# Patient Record
Sex: Female | Born: 1935 | Race: Black or African American | Hispanic: No | Smoking: Former smoker
Health system: Southern US, Community
[De-identification: ages and names within clinical notes are randomized; demographics above are authoritative.]

## PROBLEM LIST (undated history)

## (undated) DIAGNOSIS — M5136 Other intervertebral disc degeneration, lumbar region: Secondary | ICD-10-CM

## (undated) DIAGNOSIS — K449 Diaphragmatic hernia without obstruction or gangrene: Secondary | ICD-10-CM

## (undated) DIAGNOSIS — K635 Polyp of colon: Secondary | ICD-10-CM

## (undated) DIAGNOSIS — M51369 Other intervertebral disc degeneration, lumbar region without mention of lumbar back pain or lower extremity pain: Secondary | ICD-10-CM

## (undated) DIAGNOSIS — E785 Hyperlipidemia, unspecified: Secondary | ICD-10-CM

## (undated) DIAGNOSIS — K5792 Diverticulitis of intestine, part unspecified, without perforation or abscess without bleeding: Secondary | ICD-10-CM

## (undated) DIAGNOSIS — M1990 Unspecified osteoarthritis, unspecified site: Secondary | ICD-10-CM

## (undated) DIAGNOSIS — K56609 Unspecified intestinal obstruction, unspecified as to partial versus complete obstruction: Secondary | ICD-10-CM

## (undated) DIAGNOSIS — M8580 Other specified disorders of bone density and structure, unspecified site: Secondary | ICD-10-CM

## (undated) DIAGNOSIS — K219 Gastro-esophageal reflux disease without esophagitis: Secondary | ICD-10-CM

## (undated) DIAGNOSIS — I10 Essential (primary) hypertension: Secondary | ICD-10-CM

## (undated) DIAGNOSIS — C20 Malignant neoplasm of rectum: Secondary | ICD-10-CM

## (undated) HISTORY — DX: Hyperlipidemia, unspecified: E78.5

## (undated) HISTORY — DX: Other specified disorders of bone density and structure, unspecified site: M85.80

## (undated) HISTORY — PX: KNEE ARTHROSCOPY: SHX127

## (undated) HISTORY — PX: COSMETIC SURGERY: SHX468

## (undated) HISTORY — DX: Diaphragmatic hernia without obstruction or gangrene: K44.9

## (undated) HISTORY — DX: Gastro-esophageal reflux disease without esophagitis: K21.9

## (undated) HISTORY — PX: OTHER SURGICAL HISTORY: SHX169

## (undated) HISTORY — PX: COLON SURGERY: SHX602

## (undated) HISTORY — PX: BACK SURGERY: SHX140

## (undated) HISTORY — DX: Other intervertebral disc degeneration, lumbar region without mention of lumbar back pain or lower extremity pain: M51.369

## (undated) HISTORY — PX: APPENDECTOMY: SHX54

## (undated) HISTORY — PX: COLOSTOMY: SHX63

## (undated) HISTORY — PX: BREAST CYST EXCISION: SHX579

## (undated) HISTORY — PX: TUMOR REMOVAL: SHX12

## (undated) HISTORY — DX: Polyp of colon: K63.5

## (undated) HISTORY — DX: Other intervertebral disc degeneration, lumbar region: M51.36

## (undated) HISTORY — DX: Essential (primary) hypertension: I10

## (undated) HISTORY — PX: COLONOSCOPY W/ BIOPSIES AND POLYPECTOMY: SHX1376

## (undated) HISTORY — PX: COLOSTOMY TAKEDOWN: SHX5783

---

## 1999-01-09 ENCOUNTER — Other Ambulatory Visit: Admission: RE | Admit: 1999-01-09 | Discharge: 1999-01-09 | Payer: Self-pay | Admitting: Family Medicine

## 2000-10-20 ENCOUNTER — Ambulatory Visit (HOSPITAL_COMMUNITY): Admission: RE | Admit: 2000-10-20 | Discharge: 2000-10-20 | Payer: Self-pay | Admitting: Internal Medicine

## 2001-03-09 ENCOUNTER — Other Ambulatory Visit: Admission: RE | Admit: 2001-03-09 | Discharge: 2001-03-09 | Payer: Self-pay | Admitting: Family Medicine

## 2004-10-15 ENCOUNTER — Ambulatory Visit: Payer: Self-pay | Admitting: Internal Medicine

## 2004-10-16 ENCOUNTER — Ambulatory Visit (HOSPITAL_COMMUNITY): Admission: RE | Admit: 2004-10-16 | Discharge: 2004-10-16 | Payer: Self-pay | Admitting: Internal Medicine

## 2004-10-18 ENCOUNTER — Ambulatory Visit: Payer: Self-pay | Admitting: Internal Medicine

## 2004-10-25 ENCOUNTER — Ambulatory Visit (HOSPITAL_COMMUNITY): Admission: RE | Admit: 2004-10-25 | Discharge: 2004-10-25 | Payer: Self-pay | Admitting: Internal Medicine

## 2004-11-13 ENCOUNTER — Ambulatory Visit: Payer: Self-pay | Admitting: Internal Medicine

## 2004-11-18 ENCOUNTER — Ambulatory Visit (HOSPITAL_COMMUNITY): Admission: RE | Admit: 2004-11-18 | Discharge: 2004-11-18 | Payer: Self-pay | Admitting: Internal Medicine

## 2004-12-13 ENCOUNTER — Emergency Department (HOSPITAL_COMMUNITY): Admission: EM | Admit: 2004-12-13 | Discharge: 2004-12-13 | Payer: Self-pay | Admitting: Emergency Medicine

## 2005-01-09 ENCOUNTER — Other Ambulatory Visit: Admission: RE | Admit: 2005-01-09 | Discharge: 2005-01-09 | Payer: Self-pay | Admitting: Family Medicine

## 2010-05-13 ENCOUNTER — Ambulatory Visit: Payer: Medicare Other | Attending: Orthopedic Surgery | Admitting: Physical Therapy

## 2010-05-13 DIAGNOSIS — R5381 Other malaise: Secondary | ICD-10-CM | POA: Insufficient documentation

## 2010-05-13 DIAGNOSIS — M25569 Pain in unspecified knee: Secondary | ICD-10-CM | POA: Insufficient documentation

## 2010-05-13 DIAGNOSIS — M6281 Muscle weakness (generalized): Secondary | ICD-10-CM | POA: Insufficient documentation

## 2010-05-13 DIAGNOSIS — IMO0001 Reserved for inherently not codable concepts without codable children: Secondary | ICD-10-CM | POA: Insufficient documentation

## 2010-05-16 ENCOUNTER — Ambulatory Visit: Payer: Medicare Other | Admitting: Physical Therapy

## 2010-05-21 ENCOUNTER — Ambulatory Visit: Payer: Medicare Other | Admitting: Physical Therapy

## 2010-05-23 ENCOUNTER — Ambulatory Visit: Payer: Medicare Other | Admitting: Physical Therapy

## 2010-05-28 ENCOUNTER — Ambulatory Visit: Payer: Medicare Other | Admitting: Physical Therapy

## 2010-05-30 ENCOUNTER — Ambulatory Visit: Payer: Medicare Other | Attending: Orthopedic Surgery | Admitting: Physical Therapy

## 2010-05-30 DIAGNOSIS — M6281 Muscle weakness (generalized): Secondary | ICD-10-CM | POA: Insufficient documentation

## 2010-05-30 DIAGNOSIS — R5381 Other malaise: Secondary | ICD-10-CM | POA: Insufficient documentation

## 2010-05-30 DIAGNOSIS — IMO0001 Reserved for inherently not codable concepts without codable children: Secondary | ICD-10-CM | POA: Insufficient documentation

## 2010-05-30 DIAGNOSIS — M25569 Pain in unspecified knee: Secondary | ICD-10-CM | POA: Insufficient documentation

## 2010-06-04 ENCOUNTER — Ambulatory Visit: Payer: Medicare Other | Admitting: Physical Therapy

## 2010-06-06 ENCOUNTER — Ambulatory Visit: Payer: Medicare Other | Admitting: Physical Therapy

## 2010-06-11 ENCOUNTER — Ambulatory Visit: Payer: Medicare Other | Admitting: Physical Therapy

## 2010-06-13 ENCOUNTER — Ambulatory Visit: Payer: Medicare Other | Admitting: Physical Therapy

## 2010-06-18 ENCOUNTER — Ambulatory Visit: Payer: Medicare Other | Admitting: Physical Therapy

## 2010-06-20 ENCOUNTER — Ambulatory Visit: Payer: Medicare Other | Admitting: Physical Therapy

## 2010-06-26 ENCOUNTER — Ambulatory Visit: Payer: Medicare Other | Admitting: Physical Therapy

## 2010-09-09 ENCOUNTER — Ambulatory Visit: Payer: PRIVATE HEALTH INSURANCE | Admitting: Physical Therapy

## 2010-09-10 ENCOUNTER — Ambulatory Visit: Payer: Medicare Other | Attending: Family Medicine | Admitting: Physical Therapy

## 2010-09-10 DIAGNOSIS — M545 Low back pain, unspecified: Secondary | ICD-10-CM | POA: Insufficient documentation

## 2010-09-10 DIAGNOSIS — R293 Abnormal posture: Secondary | ICD-10-CM | POA: Insufficient documentation

## 2010-09-10 DIAGNOSIS — R5381 Other malaise: Secondary | ICD-10-CM | POA: Insufficient documentation

## 2010-09-10 DIAGNOSIS — IMO0001 Reserved for inherently not codable concepts without codable children: Secondary | ICD-10-CM | POA: Insufficient documentation

## 2010-09-12 ENCOUNTER — Ambulatory Visit: Payer: Medicare Other | Admitting: Physical Therapy

## 2010-09-17 ENCOUNTER — Ambulatory Visit: Payer: Medicare Other | Admitting: Physical Therapy

## 2010-09-19 ENCOUNTER — Ambulatory Visit: Payer: Medicare Other | Admitting: Physical Therapy

## 2010-09-24 ENCOUNTER — Ambulatory Visit: Payer: Medicare Other | Admitting: Physical Therapy

## 2010-09-26 ENCOUNTER — Ambulatory Visit: Payer: Medicare Other | Admitting: *Deleted

## 2010-10-01 ENCOUNTER — Ambulatory Visit: Payer: Medicare Other | Attending: Family Medicine | Admitting: Physical Therapy

## 2010-10-01 DIAGNOSIS — R293 Abnormal posture: Secondary | ICD-10-CM | POA: Insufficient documentation

## 2010-10-01 DIAGNOSIS — R5381 Other malaise: Secondary | ICD-10-CM | POA: Insufficient documentation

## 2010-10-01 DIAGNOSIS — M545 Low back pain, unspecified: Secondary | ICD-10-CM | POA: Insufficient documentation

## 2010-10-01 DIAGNOSIS — IMO0001 Reserved for inherently not codable concepts without codable children: Secondary | ICD-10-CM | POA: Insufficient documentation

## 2010-10-03 ENCOUNTER — Ambulatory Visit: Payer: Medicare Other | Admitting: Physical Therapy

## 2010-10-09 ENCOUNTER — Ambulatory Visit: Payer: Medicare Other | Admitting: Physical Therapy

## 2011-06-09 DIAGNOSIS — M545 Low back pain: Secondary | ICD-10-CM | POA: Diagnosis not present

## 2011-07-23 DIAGNOSIS — M79609 Pain in unspecified limb: Secondary | ICD-10-CM | POA: Diagnosis not present

## 2012-01-29 DIAGNOSIS — E559 Vitamin D deficiency, unspecified: Secondary | ICD-10-CM | POA: Diagnosis not present

## 2012-01-29 DIAGNOSIS — E785 Hyperlipidemia, unspecified: Secondary | ICD-10-CM | POA: Diagnosis not present

## 2012-01-29 DIAGNOSIS — Z124 Encounter for screening for malignant neoplasm of cervix: Secondary | ICD-10-CM | POA: Diagnosis not present

## 2012-01-29 DIAGNOSIS — M25569 Pain in unspecified knee: Secondary | ICD-10-CM | POA: Diagnosis not present

## 2012-01-29 DIAGNOSIS — R002 Palpitations: Secondary | ICD-10-CM | POA: Diagnosis not present

## 2012-01-29 DIAGNOSIS — Z0000 Encounter for general adult medical examination without abnormal findings: Secondary | ICD-10-CM | POA: Diagnosis not present

## 2012-01-29 DIAGNOSIS — I10 Essential (primary) hypertension: Secondary | ICD-10-CM | POA: Diagnosis not present

## 2012-01-30 DIAGNOSIS — Z23 Encounter for immunization: Secondary | ICD-10-CM | POA: Diagnosis not present

## 2012-02-03 ENCOUNTER — Encounter: Payer: Self-pay | Admitting: Gastroenterology

## 2012-03-04 ENCOUNTER — Telehealth: Payer: Self-pay | Admitting: *Deleted

## 2012-03-04 NOTE — Telephone Encounter (Signed)
Sent no show letter. Cancelled procedure on 03/18/12

## 2012-03-18 ENCOUNTER — Encounter: Payer: PRIVATE HEALTH INSURANCE | Admitting: Gastroenterology

## 2012-03-20 DIAGNOSIS — I10 Essential (primary) hypertension: Secondary | ICD-10-CM | POA: Diagnosis not present

## 2012-03-20 DIAGNOSIS — H8309 Labyrinthitis, unspecified ear: Secondary | ICD-10-CM | POA: Diagnosis not present

## 2012-03-26 DIAGNOSIS — H8309 Labyrinthitis, unspecified ear: Secondary | ICD-10-CM | POA: Diagnosis not present

## 2012-05-04 DIAGNOSIS — Z86010 Personal history of colonic polyps: Secondary | ICD-10-CM | POA: Diagnosis not present

## 2012-05-28 DIAGNOSIS — Z79899 Other long term (current) drug therapy: Secondary | ICD-10-CM | POA: Diagnosis not present

## 2012-05-28 DIAGNOSIS — R42 Dizziness and giddiness: Secondary | ICD-10-CM | POA: Diagnosis not present

## 2012-05-28 DIAGNOSIS — K648 Other hemorrhoids: Secondary | ICD-10-CM | POA: Diagnosis not present

## 2012-05-28 DIAGNOSIS — Z833 Family history of diabetes mellitus: Secondary | ICD-10-CM | POA: Diagnosis not present

## 2012-05-28 DIAGNOSIS — D126 Benign neoplasm of colon, unspecified: Secondary | ICD-10-CM | POA: Diagnosis not present

## 2012-05-28 DIAGNOSIS — Z980 Intestinal bypass and anastomosis status: Secondary | ICD-10-CM | POA: Diagnosis not present

## 2012-05-28 DIAGNOSIS — Z860100 Personal history of colon polyps, unspecified: Secondary | ICD-10-CM | POA: Diagnosis not present

## 2012-05-28 DIAGNOSIS — Z1211 Encounter for screening for malignant neoplasm of colon: Secondary | ICD-10-CM | POA: Diagnosis not present

## 2012-05-28 DIAGNOSIS — Z823 Family history of stroke: Secondary | ICD-10-CM | POA: Diagnosis not present

## 2012-05-28 DIAGNOSIS — E785 Hyperlipidemia, unspecified: Secondary | ICD-10-CM | POA: Diagnosis not present

## 2012-05-28 DIAGNOSIS — D175 Benign lipomatous neoplasm of intra-abdominal organs: Secondary | ICD-10-CM | POA: Diagnosis not present

## 2012-05-28 DIAGNOSIS — I10 Essential (primary) hypertension: Secondary | ICD-10-CM | POA: Diagnosis not present

## 2012-05-28 DIAGNOSIS — K620 Anal polyp: Secondary | ICD-10-CM | POA: Diagnosis not present

## 2012-05-28 DIAGNOSIS — Z7982 Long term (current) use of aspirin: Secondary | ICD-10-CM | POA: Diagnosis not present

## 2012-05-28 DIAGNOSIS — K5730 Diverticulosis of large intestine without perforation or abscess without bleeding: Secondary | ICD-10-CM | POA: Diagnosis not present

## 2012-05-28 DIAGNOSIS — Z8719 Personal history of other diseases of the digestive system: Secondary | ICD-10-CM | POA: Diagnosis not present

## 2012-06-07 DIAGNOSIS — H8110 Benign paroxysmal vertigo, unspecified ear: Secondary | ICD-10-CM | POA: Diagnosis not present

## 2012-06-09 DIAGNOSIS — M899 Disorder of bone, unspecified: Secondary | ICD-10-CM | POA: Diagnosis not present

## 2012-06-09 DIAGNOSIS — M949 Disorder of cartilage, unspecified: Secondary | ICD-10-CM | POA: Diagnosis not present

## 2012-06-09 DIAGNOSIS — M810 Age-related osteoporosis without current pathological fracture: Secondary | ICD-10-CM | POA: Diagnosis not present

## 2012-06-24 ENCOUNTER — Ambulatory Visit (INDEPENDENT_AMBULATORY_CARE_PROVIDER_SITE_OTHER): Payer: Medicare Other | Admitting: General Practice

## 2012-06-24 ENCOUNTER — Ambulatory Visit (INDEPENDENT_AMBULATORY_CARE_PROVIDER_SITE_OTHER): Payer: Medicare Other

## 2012-06-24 VITALS — BP 160/82 | HR 76 | Temp 97.2°F | Ht 63.5 in | Wt 162.0 lb

## 2012-06-24 DIAGNOSIS — M25569 Pain in unspecified knee: Secondary | ICD-10-CM | POA: Diagnosis not present

## 2012-06-24 DIAGNOSIS — M1990 Unspecified osteoarthritis, unspecified site: Secondary | ICD-10-CM

## 2012-06-24 DIAGNOSIS — M25561 Pain in right knee: Secondary | ICD-10-CM

## 2012-06-24 DIAGNOSIS — M129 Arthropathy, unspecified: Secondary | ICD-10-CM

## 2012-06-24 MED ORDER — MELOXICAM 15 MG PO TABS: 15.0000 mg | ORAL_TABLET | Freq: Every day | ORAL | Status: DC

## 2012-06-24 NOTE — Patient Instructions (Addendum)
Knee Pain The knee is the complex joint between your thigh and your lower leg. It is made up of bones, tendons, ligaments, and cartilage. The bones that make up the knee are:  The femur in the thigh.  The tibia and fibula in the lower leg.  The patella or kneecap riding in the groove on the lower femur. CAUSES  Knee pain is a common complaint with many causes. A few of these causes are:  Injury, such as:  A ruptured ligament or tendon injury.  Torn cartilage.  Medical conditions, such as:  Gout  Arthritis  Infections  Overuse, over training or overdoing a physical activity. Knee pain can be minor or severe. Knee pain can accompany debilitating injury. Minor knee problems often respond well to self-care measures or get well on their own. More serious injuries may need medical intervention or even surgery. SYMPTOMS The knee is complex. Symptoms of knee problems can vary widely. Some of the problems are:  Pain with movement and weight bearing.  Swelling and tenderness.  Buckling of the knee.  Inability to straighten or extend your knee.  Your knee locks and you cannot straighten it.  Warmth and redness with pain and fever.  Deformity or dislocation of the kneecap. DIAGNOSIS  Determining what is wrong may be very straight forward such as when there is an injury. It can also be challenging because of the complexity of the knee. Tests to make a diagnosis may include:  Your caregiver taking a history and doing a physical exam.  Routine X-rays can be used to rule out other problems. X-rays will not reveal a cartilage tear. Some injuries of the knee can be diagnosed by:  Arthroscopy a surgical technique by which a small video camera is inserted through tiny incisions on the sides of the knee. This procedure is used to examine and repair internal knee joint problems. Tiny instruments can be used during arthroscopy to repair the torn knee cartilage (meniscus).  Arthrography  is a radiology technique. A contrast liquid is directly injected into the knee joint. Internal structures of the knee joint then become visible on X-ray film.  An MRI scan is a non x-ray radiology procedure in which magnetic fields and a computer produce two- or three-dimensional images of the inside of the knee. Cartilage tears are often visible using an MRI scanner. MRI scans have largely replaced arthrography in diagnosing cartilage tears of the knee.  Blood work.  Examination of the fluid that helps to lubricate the knee joint (synovial fluid). This is done by taking a sample out using a needle and a syringe. TREATMENT The treatment of knee problems depends on the cause. Some of these treatments are:  Depending on the injury, proper casting, splinting, surgery or physical therapy care will be needed.  Give yourself adequate recovery time. Do not overuse your joints. If you begin to get sore during workout routines, back off. Slow down or do fewer repetitions.  For repetitive activities such as cycling or running, maintain your strength and nutrition.  Alternate muscle groups. For example if you are a weight lifter, work the upper body on one day and the lower body the next.  Either tight or weak muscles do not give the proper support for your knee. Tight or weak muscles do not absorb the stress placed on the knee joint. Keep the muscles surrounding the knee strong.  Take care of mechanical problems.  If you have flat feet, orthotics or special shoes may help.   See your caregiver if you need help.  Arch supports, sometimes with wedges on the inner or outer aspect of the heel, can help. These can shift pressure away from the side of the knee most bothered by osteoarthritis.  A brace called an "unloader" brace also may be used to help ease the pressure on the most arthritic side of the knee.  If your caregiver has prescribed crutches, braces, wraps or ice, use as directed. The acronym for  this is Deskins. This means protection, rest, ice, compression and elevation.  Nonsteroidal anti-inflammatory drugs (NSAID's), can help relieve pain. But if taken immediately after an injury, they may actually increase swelling. Take NSAID's with food in your stomach. Stop them if you develop stomach problems. Do not take these if you have a history of ulcers, stomach pain or bleeding from the bowel. Do not take without your caregiver's approval if you have problems with fluid retention, heart failure, or kidney problems.  For ongoing knee problems, physical therapy may be helpful.  Glucosamine and chondroitin are over-the-counter dietary supplements. Both may help relieve the pain of osteoarthritis in the knee. These medicines are different from the usual anti-inflammatory drugs. Glucosamine may decrease the rate of cartilage destruction.  Injections of a corticosteroid drug into your knee joint may help reduce the symptoms of an arthritis flare-up. They may provide pain relief that lasts a few months. You may have to wait a few months between injections. The injections do have a small increased risk of infection, water retention and elevated blood sugar levels.  Hyaluronic acid injected into damaged joints may ease pain and provide lubrication. These injections may work by reducing inflammation. A series of shots may give relief for as long as 6 months.  Topical painkillers. Applying certain ointments to your skin may help relieve the pain and stiffness of osteoarthritis. Ask your pharmacist for suggestions. Many over the-counter products are approved for temporary relief of arthritis pain.  In some countries, doctors often prescribe topical NSAID's for relief of chronic conditions such as arthritis and tendinitis. A review of treatment with NSAID creams found that they worked as well as oral medications but without the serious side effects. PREVENTION  Maintain a healthy weight. Extra pounds put  more strain on your joints.  Get strong, stay limber. Weak muscles are a common cause of knee injuries. Stretching is important. Include flexibility exercises in your workouts.  Be smart about exercise. If you have osteoarthritis, chronic knee pain or recurring injuries, you may need to change the way you exercise. This does not mean you have to stop being active. If your knees ache after jogging or playing basketball, consider switching to swimming, water aerobics or other low-impact activities, at least for a few days a week. Sometimes limiting high-impact activities will provide relief.  Make sure your shoes fit well. Choose footwear that is right for your sport.  Protect your knees. Use the proper gear for knee-sensitive activities. Use kneepads when playing volleyball or laying carpet. Buckle your seat belt every time you drive. Most shattered kneecaps occur in car accidents.  Rest when you are tired. SEEK MEDICAL CARE IF:  You have knee pain that is continual and does not seem to be getting better.  SEEK IMMEDIATE MEDICAL CARE IF:  Your knee joint feels hot to the touch and you have a high fever. MAKE SURE YOU:   Understand these instructions.  Will watch your condition.  Will get help right away if you are not   doing well or get worse. Document Released: 01/12/2007 Document Revised: 06/09/2011 Document Reviewed: 01/12/2007 [facility] Patient Information 2013 [place], [place].   Arthritis, Nonspecific Arthritis is inflammation of a joint. This usually means pain, redness, warmth or swelling are present. One or more joints may be involved. There are a number of types of arthritis. Your caregiver may not be able to tell what type of arthritis you have right away. CAUSES  The most common cause of arthritis is the wear and tear on the joint (osteoarthritis). This causes damage to the cartilage, which can break down over time. The knees, hips, back and neck are most often affected by this  type of arthritis. Other types of arthritis and common causes of joint pain include:  Sprains and other injuries near the joint. Sometimes minor sprains and injuries cause pain and swelling that develop hours later.  Rheumatoid arthritis. This affects hands, feet and knees. It usually affects both sides of your body at the same time. It is often associated with chronic ailments, fever, weight loss and general weakness.  Crystal arthritis. Gout and pseudo gout can cause occasional acute severe pain, redness and swelling in the foot, ankle, or knee.  Infectious arthritis. Bacteria can get into a joint through a break in overlying skin. This can cause infection of the joint. Bacteria and viruses can also spread through the blood and affect your joints.  Drug, infectious and allergy reactions. Sometimes joints can become mildly painful and slightly swollen with these types of illnesses. SYMPTOMS   Pain is the main symptom.  Your joint or joints can also be red, swollen and warm or hot to the touch.  You may have a fever with certain types of arthritis, or even feel overall ill.  The joint with arthritis will hurt with movement. Stiffness is present with some types of arthritis. DIAGNOSIS  Your caregiver will suspect arthritis based on your description of your symptoms and on your exam. Testing may be needed to find the type of arthritis:  Blood and sometimes urine tests.  X-ray tests and sometimes CT or MRI scans.  Removal of fluid from the joint (arthrocentesis) is done to check for bacteria, crystals or other causes. Your caregiver (or a specialist) will numb the area over the joint with a local anesthetic, and use a needle to remove joint fluid for examination. This procedure is only minimally uncomfortable.  Even with these tests, your caregiver may not be able to tell what kind of arthritis you have. Consultation with a specialist (rheumatologist) may be helpful. TREATMENT  Your  caregiver will discuss with you treatment specific to your type of arthritis. If the specific type cannot be determined, then the following general recommendations may apply. Treatment of severe joint pain includes:  Rest.  Elevation.  Anti-inflammatory medication (for example, ibuprofen) may be prescribed. Avoiding activities that cause increased pain.  Only take over-the-counter or prescription medicines for pain and discomfort as recommended by your caregiver.  Cold packs over an inflamed joint may be used for 10 to 15 minutes every hour. Hot packs sometimes feel better, but do not use overnight. Do not use hot packs if you are diabetic without your caregiver's permission.  A cortisone shot into arthritic joints may help reduce pain and swelling.  Any acute arthritis that gets worse over the next 1 to 2 days needs to be looked at to be sure there is no joint infection. Long-term arthritis treatment involves modifying activities and lifestyle to reduce joint stress  jarring. This can include weight loss. Also, exercise is needed to nourish the joint cartilage and remove waste. This helps keep the muscles around the joint strong. HOME CARE INSTRUCTIONS   Do not take aspirin to relieve pain if gout is suspected. This elevates uric acid levels.  Only take over-the-counter or prescription medicines for pain, discomfort or fever as directed by your caregiver.  Rest the joint as much as possible.  If your joint is swollen, keep it elevated.  Use crutches if the painful joint is in your leg.  Drinking plenty of fluids may help for certain types of arthritis.  Follow your caregiver's dietary instructions.  Try low-impact exercise such as:  Swimming.  Water aerobics.  Biking.  Walking.  Morning stiffness is often relieved by a warm shower.  Put your joints through regular range-of-motion. SEEK MEDICAL CARE IF:   You do not feel better in 24 hours or are getting worse.  You  have side effects to medications, or are not getting better with treatment. SEEK IMMEDIATE MEDICAL CARE IF:   You have a fever.  You develop severe joint pain, swelling or redness.  Many joints are involved and become painful and swollen.  There is severe back pain and/or leg weakness.  You have loss of bowel or bladder control. Document Released: 04/24/2004 Document Revised: 06/09/2011 Document Reviewed: 05/10/2008 [facility] Patient Information 2013 [place], [place].

## 2012-06-24 NOTE — Progress Notes (Addendum)
  Subjective:    Patient ID: Anita Perez, female    DOB: February 06, 1936, 77 y.o.   MRN: [id number]  HPI Presents with right knee pain. Larey Seat 2 years ago and had surgery. Reports surgeon informed her that she had arthritis. Reports she has had pain since her fall 2 years ago and its gradually worsening, especially over past 3 days. Difficulty bearing weight upon first standing from a sitting position (depending on length of time sitting). OTC tylenol being used with minimal relief. Denies taken prescription arthritic medication. Denies recent trauma or injury.     Review of Systems  Constitutional: Negative for fever and chills.  Respiratory: Negative for chest tightness and shortness of breath.   Cardiovascular: Negative for chest pain and leg swelling.  Musculoskeletal: Positive for joint swelling.       Right knee       Objective:   Physical Exam  Constitutional: She is oriented to person, place, and time. She appears well-developed and well-nourished.  Cardiovascular: Normal rate, regular rhythm and normal heart sounds.   No murmur heard. Musculoskeletal: She exhibits edema.  Limited range of motion in right knee. Patient has some noticeable difficulty with walking initially upon standing.   Neurological: She is alert and oriented to person, place, and time.  Skin: Skin is warm and dry.  Psychiatric: She has a normal mood and affect.  crepitus to right knee felt with flex and extension of right knee  [facility] reading (PRIMARY) by Ruthell Rummage, FNP-C, no dislocation or fracture noted to right knee.                                       Assessment & Plan:  Take medication as prescribed Warm compresses to right knee Physical activity increased as tolerated RTO if symptoms worsen or no improvement Patient verbalized understanding   Raymon Mutton, FNP-C

## 2012-08-10 ENCOUNTER — Ambulatory Visit: Payer: Self-pay | Admitting: Family Medicine

## 2012-11-04 ENCOUNTER — Telehealth: Payer: Self-pay | Admitting: General Practice

## 2012-11-08 NOTE — Telephone Encounter (Signed)
LM for pt to make appt, needs labs before we can fill mail order

## 2012-11-22 ENCOUNTER — Encounter: Payer: Self-pay | Admitting: General Practice

## 2012-11-22 ENCOUNTER — Ambulatory Visit (INDEPENDENT_AMBULATORY_CARE_PROVIDER_SITE_OTHER): Payer: Medicare Other | Admitting: General Practice

## 2012-11-22 VITALS — BP 169/82 | HR 79 | Temp 97.8°F | Ht 63.5 in | Wt 166.0 lb

## 2012-11-22 DIAGNOSIS — Z09 Encounter for follow-up examination after completed treatment for conditions other than malignant neoplasm: Secondary | ICD-10-CM | POA: Diagnosis not present

## 2012-11-22 DIAGNOSIS — E785 Hyperlipidemia, unspecified: Secondary | ICD-10-CM | POA: Diagnosis not present

## 2012-11-22 DIAGNOSIS — I10 Essential (primary) hypertension: Secondary | ICD-10-CM

## 2012-11-22 LAB — POCT CBC
HCT, POC: 36.4 % — AB (ref 37.7–47.9)
Hemoglobin: 11.7 g/dL — AB (ref 12.2–16.2)
Lymph, poc: 1.7 (ref 0.6–3.4)
MCH, POC: 25.8 pg — AB (ref 27–31.2)
MCHC: 32.2 g/dL (ref 31.8–35.4)
MCV: 79.9 fL — AB (ref 80–97)
MPV: 7.6 fL (ref 0–99.8)
Platelet Count, POC: 257 10*3/uL (ref 142–424)
RDW, POC: 15.2 %
WBC: 5.9 10*3/uL (ref 4.6–10.2)

## 2012-11-22 MED ORDER — ATORVASTATIN CALCIUM 40 MG PO TABS: 40.0000 mg | ORAL_TABLET | Freq: Every day | ORAL | Status: DC

## 2012-11-22 MED ORDER — AMLODIPINE BESYLATE 10 MG PO TABS: 10.0000 mg | ORAL_TABLET | Freq: Every day | ORAL | Status: DC

## 2012-11-22 NOTE — Patient Instructions (Signed)

## 2012-11-22 NOTE — Progress Notes (Signed)
  Subjective:    Patient ID: Anita Perez, female    DOB: [birth year], 77 y.o.   MRN: [id number]  HPI Patient presents today for 3 month follow up. She has a history of hypertension, hyperlipidemia, and arthritis. Reports taking medications as directed. Reports eating healthy, baked and broiled foods. She reports walking daily and work in garden. Denies taking blood pressure medication this morning due to not eating and having blood work.     Review of Systems  Constitutional: Negative for fever and chills.  HENT: Negative for neck pain and neck stiffness.   Respiratory: Negative for chest tightness and shortness of breath.   Cardiovascular: Negative for chest pain and palpitations.  Gastrointestinal: Negative for vomiting, abdominal pain, diarrhea and blood in stool.  Genitourinary: Negative for dysuria, hematuria and difficulty urinating.  Neurological: Negative for dizziness, weakness and headaches.       Objective:   Physical Exam  Constitutional: She is oriented to person, place, and time. She appears well-developed and well-nourished.  HENT:  Head: Normocephalic and atraumatic.  Right Ear: External ear normal.  Left Ear: External ear normal.  Nose: Nose normal.  Mouth/Throat: Oropharynx is clear and moist.  Eyes: EOM are normal. Pupils are equal, round, and reactive to light.  Neck: Normal range of motion. Neck supple. No thyromegaly present.  Cardiovascular: Normal rate, regular rhythm and normal heart sounds.   Pulmonary/Chest: Effort normal and breath sounds normal. No respiratory distress. She exhibits no tenderness.  Abdominal: Soft. Bowel sounds are normal. She exhibits no distension. There is no tenderness.  Musculoskeletal:  Crepitus noted to right knee  Lymphadenopathy:    She has no cervical adenopathy.  Neurological: She is alert and oriented to person, place, and time.  Skin: Skin is warm and dry.  Psychiatric: She has a normal mood and affect.           Assessment & Plan:  1. Hypertension - CMP14+EGFR - amLODipine (NORVASC) 10 MG tablet; Take 1 tablet (10 mg total) by mouth daily.  Dispense: 90 tablet; Refill: 0  2. Other and unspecified hyperlipidemia - NMR, lipoprofile - atorvastatin (LIPITOR) 40 MG tablet; Take 1 tablet (40 mg total) by mouth daily.  Dispense: 90 tablet; Refill: 0  3. Follow-up exam, 3-6 months since previous exam - POCT CBC -Continue all current medications Labs pending F/u in 3 months and prn Discussed exercise and healthy eating habits Patient verbalized understanding Coralie Keens, FNP-C

## 2012-11-23 LAB — NMR, LIPOPROFILE
Cholesterol: 156 mg/dL (ref <200)
HDL Particle Number: 33.8 umol/L (ref >=30.5)
LDLC SERPL CALC-MCNC: 91 mg/dL (ref <100)
LP-IR Score: 72 — ABNORMAL HIGH (ref <=45)
Small LDL Particle Number: 581 nmol/L — ABNORMAL HIGH (ref <=527)
Triglycerides by NMR: 111 mg/dL (ref <150)

## 2012-11-23 LAB — CMP14+EGFR
Creatinine, Ser: 0.97 mg/dL (ref 0.57–1.00)
GFR calc non Af Amer: 57 mL/min/{1.73_m2} — ABNORMAL LOW (ref >59)
Sodium: 141 mmol/L (ref 134–144)

## 2013-03-27 DIAGNOSIS — M545 Low back pain: Secondary | ICD-10-CM | POA: Diagnosis not present

## 2013-04-01 ENCOUNTER — Ambulatory Visit (INDEPENDENT_AMBULATORY_CARE_PROVIDER_SITE_OTHER): Payer: Medicare Other

## 2013-04-01 ENCOUNTER — Ambulatory Visit (INDEPENDENT_AMBULATORY_CARE_PROVIDER_SITE_OTHER): Payer: Medicare Other | Admitting: Family Medicine

## 2013-04-01 VITALS — BP 152/80 | HR 83 | Temp 97.6°F | Ht 63.5 in | Wt 155.0 lb

## 2013-04-01 DIAGNOSIS — Z23 Encounter for immunization: Secondary | ICD-10-CM

## 2013-04-01 DIAGNOSIS — M549 Dorsalgia, unspecified: Secondary | ICD-10-CM

## 2013-04-01 MED ORDER — KETOROLAC TROMETHAMINE 60 MG/2ML IM SOLN
60.0000 mg | Freq: Once | INTRAMUSCULAR | Status: AC
  Administered 2013-04-01: 60 mg via INTRAMUSCULAR

## 2013-04-01 MED ORDER — PREDNISONE 10 MG PO TABS: ORAL_TABLET | ORAL | Status: DC

## 2013-04-01 MED ORDER — ACETAMINOPHEN-CODEINE #3 300-30 MG PO TABS: 1.0000 | ORAL_TABLET | ORAL | Status: DC | PRN

## 2013-04-01 NOTE — Patient Instructions (Signed)

## 2013-04-01 NOTE — Progress Notes (Signed)
   Subjective:    Patient ID: Anita Perez, female    DOB: 1936/03/03, 78 y.o.   MRN: [id number]  HPI This 78 y.o. female presents for evaluation of back pain radiating to her left foot for 4 days. She was seen at an urgent care and was  rx'd tylenol 3 but is still hurting and she is having Difficulty walking.   Review of Systems No chest pain, SOB, HA, dizziness, vision change, N/V, diarrhea, constipation, dysuria, urinary urgency or frequency, myalgias, arthralgias or rash.     Objective:   Physical Exam Vital signs noted  Well developed well nourished female.  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  Follow up in one week. Flu shot today  Anita Penner FNP

## 2013-04-05 ENCOUNTER — Telehealth: Payer: Self-pay | Admitting: Family Medicine

## 2013-04-05 NOTE — Telephone Encounter (Signed)
appt moved  °

## 2013-04-06 ENCOUNTER — Encounter: Payer: Self-pay | Admitting: Family Medicine

## 2013-04-06 ENCOUNTER — Other Ambulatory Visit: Payer: Self-pay | Admitting: *Deleted

## 2013-04-06 ENCOUNTER — Ambulatory Visit (INDEPENDENT_AMBULATORY_CARE_PROVIDER_SITE_OTHER): Payer: Medicare Other | Admitting: Family Medicine

## 2013-04-06 VITALS — BP 152/73 | HR 113 | Temp 98.1°F | Ht 63.5 in | Wt 158.0 lb

## 2013-04-06 DIAGNOSIS — M549 Dorsalgia, unspecified: Secondary | ICD-10-CM | POA: Diagnosis not present

## 2013-04-06 DIAGNOSIS — I10 Essential (primary) hypertension: Secondary | ICD-10-CM

## 2013-04-06 DIAGNOSIS — E785 Hyperlipidemia, unspecified: Secondary | ICD-10-CM

## 2013-04-06 MED ORDER — ACETAMINOPHEN-CODEINE #3 300-30 MG PO TABS: 1.0000 | ORAL_TABLET | ORAL | Status: DC | PRN

## 2013-04-06 MED ORDER — GABAPENTIN 300 MG PO CAPS: 300.0000 mg | ORAL_CAPSULE | Freq: Three times a day (TID) | ORAL | Status: DC

## 2013-04-06 MED ORDER — AMLODIPINE BESYLATE 10 MG PO TABS: 10.0000 mg | ORAL_TABLET | Freq: Every day | ORAL | Status: DC

## 2013-04-06 MED ORDER — ATORVASTATIN CALCIUM 40 MG PO TABS: 40.0000 mg | ORAL_TABLET | Freq: Every day | ORAL | Status: DC

## 2013-04-06 NOTE — Progress Notes (Signed)
   Subjective:    Patient ID: Anita Perez, female    DOB: December 10, 1935, 78 y.o.   MRN: [id number]  HPI This 78 y.o. female presents for evaluation of pain in her left foot.  She was seen a few weeks back and was having pain radiating down her left leg to her left foot.  She has had xray of LS spine and it showed DDD.   She has been on pred taper and tylenol #3 and this has helped but she is having residual numbness and tingling in her left foot pain and her pain is severe.   Review of Systems C/o left foot pain and numbness. No chest pain, SOB, HA, dizziness, vision change, N/V, diarrhea, constipation, dysuria, urinary urgency or frequency, myalgias, arthralgias or rash.     Objective:   Physical Exam  Vital signs noted  Well developed well nourished female.  HEENT - Head atraumatic Normocephalic                Eyes - PERRLA, Conjuctiva - clear Sclera- Clear EOMI                Ears - EAC's Wnl TM's Wnl Gross Hearing WNL                Nose - Nares patent                 Throat - oropharanx wnl Respiratory - Lungs CTA bilateral Cardiac - RRR S1 and S2 without murmur GI - Abdomen soft Nontender and bowel sounds active x 4 Extremities - No edema. Neuro - Grossly intact.  Normal dorsi and plantar flexion left foot.      Assessment & Plan:  Back pain with radiation - Plan: gabapentin (NEURONTIN) 300 MG capsule, acetaminophen-codeine (TYLENOL #3) 300-30 MG per tablet, DISCONTINUED: acetaminophen-codeine (TYLENOL #3) 300-30 MG per tablet Discussed if not better then will get MRI follow up prn.  Lysbeth Penner FNP

## 2013-04-06 NOTE — Patient Instructions (Signed)
Glossopharyngeal Neuralgia Glossopharyngeal neuralgia is a disorder characterized by intense pain. The pain happens in the:  Tonsils.  Middle ear.  Back of the tongue. The pain can come and go, or it can be fairly persistent.  CAUSES  Compression of the 9th nerve (glossopharyngeal) or 10th nerve (vagus) causes glossopharyngeal neuralgia. These nerves come from your brain (cranial nerves). Compression may be from:  Tumors.  Peritonsillar abscesses.  Vascular aneurysms. In some cases, no cause is evident. Triggers of the disorder may include:  Swallowing.  Chewing.  Talking.  Sneezing.  Eating spicy foods. TREATMENT  Generally, treatment is symptomatic. Medications may be prescribed to reduce the pain. In some cases, surgery may be needed to relieve pressure on the nerve. People with this disorder have remissions. They can also have times of increased pain. For many individuals, drug therapy reduces or eliminates the pain enough for them to carry on with their lives. When surgery is needed, most patients have very good results. Document Released: 03/07/2002 Document Revised: 06/09/2011 Document Reviewed: 04/21/2008 [facility] Patient Information 2014 [place].

## 2013-04-08 ENCOUNTER — Ambulatory Visit: Payer: Medicare Other | Admitting: Family Medicine

## 2013-04-11 ENCOUNTER — Other Ambulatory Visit: Payer: Self-pay | Admitting: Family Medicine

## 2013-04-11 ENCOUNTER — Telehealth: Payer: Self-pay | Admitting: Family Medicine

## 2013-04-11 DIAGNOSIS — M5416 Radiculopathy, lumbar region: Secondary | ICD-10-CM

## 2013-04-15 ENCOUNTER — Other Ambulatory Visit: Payer: Self-pay | Admitting: Family Medicine

## 2013-04-15 ENCOUNTER — Telehealth: Payer: Self-pay | Admitting: Family Medicine

## 2013-04-15 NOTE — Telephone Encounter (Signed)
There really no stronger medicine that tylenol #3 and needs to go to ED if she hurts this bad.

## 2013-04-18 NOTE — Telephone Encounter (Signed)
Do you have MRI schedule yet?

## 2013-04-19 ENCOUNTER — Other Ambulatory Visit: Payer: Self-pay | Admitting: Family Medicine

## 2013-04-19 NOTE — Telephone Encounter (Signed)
Please follow up or if pain unbearable go to ED

## 2013-04-20 ENCOUNTER — Other Ambulatory Visit: Payer: Self-pay | Admitting: Family Medicine

## 2013-04-20 ENCOUNTER — Ambulatory Visit (HOSPITAL_COMMUNITY)
Admission: RE | Admit: 2013-04-20 | Discharge: 2013-04-20 | Disposition: A | Discharge status: Home / Self Care | Payer: Medicare Other | Source: Ambulatory Visit | Attending: Family Medicine | Admitting: Family Medicine

## 2013-04-20 DIAGNOSIS — M48061 Spinal stenosis, lumbar region without neurogenic claudication: Secondary | ICD-10-CM | POA: Insufficient documentation

## 2013-04-20 DIAGNOSIS — M5416 Radiculopathy, lumbar region: Secondary | ICD-10-CM

## 2013-04-20 DIAGNOSIS — M79609 Pain in unspecified limb: Secondary | ICD-10-CM | POA: Insufficient documentation

## 2013-04-20 DIAGNOSIS — M25559 Pain in unspecified hip: Secondary | ICD-10-CM | POA: Diagnosis not present

## 2013-04-20 DIAGNOSIS — M5126 Other intervertebral disc displacement, lumbar region: Secondary | ICD-10-CM | POA: Diagnosis not present

## 2013-04-20 NOTE — Telephone Encounter (Signed)
PT NOTIFIED AND VERBALIZED UNDERSTANDING. I SPOKE WITH DEBBI AND SHE WILL WORK ON REFERRAL TODAY. PT AWARE

## 2013-04-21 ENCOUNTER — Other Ambulatory Visit: Payer: Self-pay | Admitting: Family Medicine

## 2013-04-21 DIAGNOSIS — M549 Dorsalgia, unspecified: Secondary | ICD-10-CM

## 2013-04-21 MED ORDER — ACETAMINOPHEN-CODEINE #3 300-30 MG PO TABS: 1.0000 | ORAL_TABLET | ORAL | Status: DC | PRN

## 2013-04-25 DIAGNOSIS — E663 Overweight: Secondary | ICD-10-CM | POA: Diagnosis not present

## 2013-04-25 DIAGNOSIS — M5126 Other intervertebral disc displacement, lumbar region: Secondary | ICD-10-CM | POA: Diagnosis not present

## 2013-05-02 DIAGNOSIS — I10 Essential (primary) hypertension: Secondary | ICD-10-CM | POA: Diagnosis not present

## 2013-05-02 DIAGNOSIS — E669 Obesity, unspecified: Secondary | ICD-10-CM | POA: Diagnosis not present

## 2013-05-02 DIAGNOSIS — M5126 Other intervertebral disc displacement, lumbar region: Secondary | ICD-10-CM | POA: Diagnosis not present

## 2013-05-05 ENCOUNTER — Telehealth: Payer: Self-pay | Admitting: Family Medicine

## 2013-05-05 DIAGNOSIS — I10 Essential (primary) hypertension: Secondary | ICD-10-CM

## 2013-05-05 DIAGNOSIS — E785 Hyperlipidemia, unspecified: Secondary | ICD-10-CM

## 2013-05-05 MED ORDER — ATORVASTATIN CALCIUM 40 MG PO TABS: 40.0000 mg | ORAL_TABLET | Freq: Every day | ORAL | Status: DC

## 2013-05-05 MED ORDER — AMLODIPINE BESYLATE 10 MG PO TABS: 10.0000 mg | ORAL_TABLET | Freq: Every day | ORAL | Status: DC

## 2013-05-05 NOTE — Telephone Encounter (Signed)
Done

## 2013-05-06 ENCOUNTER — Telehealth: Payer: Self-pay | Admitting: Family Medicine

## 2013-05-06 ENCOUNTER — Other Ambulatory Visit: Payer: Self-pay | Admitting: General Practice

## 2013-05-06 ENCOUNTER — Other Ambulatory Visit: Payer: Self-pay | Admitting: *Deleted

## 2013-05-06 DIAGNOSIS — I10 Essential (primary) hypertension: Secondary | ICD-10-CM

## 2013-05-06 DIAGNOSIS — E785 Hyperlipidemia, unspecified: Secondary | ICD-10-CM

## 2013-05-06 MED ORDER — ATORVASTATIN CALCIUM 40 MG PO TABS: 40.0000 mg | ORAL_TABLET | Freq: Every day | ORAL | Status: DC

## 2013-05-06 MED ORDER — AMLODIPINE BESYLATE 10 MG PO TABS
10.0000 mg | ORAL_TABLET | Freq: Every day | ORAL | Status: DC
Start: 2013-05-06 — End: 2013-05-06

## 2013-05-06 MED ORDER — AMLODIPINE BESYLATE 10 MG PO TABS: 10.0000 mg | ORAL_TABLET | Freq: Every day | ORAL | Status: DC

## 2013-05-16 ENCOUNTER — Other Ambulatory Visit: Payer: Self-pay | Admitting: Neurosurgery

## 2013-05-16 DIAGNOSIS — E663 Overweight: Secondary | ICD-10-CM | POA: Diagnosis not present

## 2013-05-16 DIAGNOSIS — M5126 Other intervertebral disc displacement, lumbar region: Secondary | ICD-10-CM | POA: Diagnosis not present

## 2013-05-31 ENCOUNTER — Other Ambulatory Visit: Payer: Self-pay | Admitting: Family Medicine

## 2013-06-01 ENCOUNTER — Encounter (HOSPITAL_COMMUNITY): Payer: Self-pay | Admitting: Pharmacy Technician

## 2013-06-02 NOTE — Telephone Encounter (Signed)
Last seen 04/06/13  [facility]  If approved print and route to nurse

## 2013-06-06 ENCOUNTER — Ambulatory Visit (HOSPITAL_COMMUNITY)
Admission: RE | Admit: 2013-06-06 | Discharge: 2013-06-06 | Disposition: A | Discharge status: Home / Self Care | Payer: Medicare Other | Source: Ambulatory Visit | Attending: Anesthesiology | Admitting: Anesthesiology

## 2013-06-06 ENCOUNTER — Encounter (HOSPITAL_COMMUNITY): Payer: Self-pay

## 2013-06-06 ENCOUNTER — Encounter (HOSPITAL_COMMUNITY)
Admission: RE | Admit: 2013-06-06 | Discharge: 2013-06-06 | Disposition: A | Discharge status: Home / Self Care | Payer: Medicare Other | Source: Ambulatory Visit | Attending: Neurosurgery | Admitting: Neurosurgery

## 2013-06-06 DIAGNOSIS — Z01810 Encounter for preprocedural cardiovascular examination: Secondary | ICD-10-CM | POA: Diagnosis not present

## 2013-06-06 DIAGNOSIS — I10 Essential (primary) hypertension: Secondary | ICD-10-CM | POA: Diagnosis not present

## 2013-06-06 DIAGNOSIS — Z01818 Encounter for other preprocedural examination: Secondary | ICD-10-CM | POA: Insufficient documentation

## 2013-06-06 DIAGNOSIS — Z01812 Encounter for preprocedural laboratory examination: Secondary | ICD-10-CM | POA: Insufficient documentation

## 2013-06-06 LAB — SURGICAL PCR SCREEN
MRSA, PCR: NEGATIVE
Staphylococcus aureus: NEGATIVE

## 2013-06-06 LAB — BASIC METABOLIC PANEL
BUN: 21 mg/dL (ref 6–23)
CO2: 26 mEq/L (ref 19–32)
Calcium: 9.5 mg/dL (ref 8.4–10.5)
Chloride: 102 mEq/L (ref 96–112)
Creatinine, Ser: 0.85 mg/dL (ref 0.50–1.10)
GFR calc non Af Amer: 65 mL/min — ABNORMAL LOW (ref >90)
GFR, EST AFRICAN AMERICAN: 76 mL/min — AB (ref >90)
Glucose, Bld: 92 mg/dL (ref 70–99)
POTASSIUM: 3.8 meq/L (ref 3.7–5.3)
Sodium: 141 mEq/L (ref 137–147)

## 2013-06-06 LAB — CBC
HCT: 37.6 % (ref 36.0–46.0)
HEMOGLOBIN: 12.2 g/dL (ref 12.0–15.0)
MCH: 27.2 pg (ref 26.0–34.0)
MCHC: 32.4 g/dL (ref 30.0–36.0)
MCV: 83.7 fL (ref 78.0–100.0)
Platelets: 353 10*3/uL (ref 150–400)
RBC: 4.49 MIL/uL (ref 3.87–5.11)
RDW: 14.4 % (ref 11.5–15.5)
WBC: 8.5 10*3/uL (ref 4.0–10.5)

## 2013-06-06 NOTE — Pre-Procedure Instructions (Signed)
Anita Perez  06/06/2013   Your procedure is scheduled on:  Tuesday, March 17th  Report to Admitting at 0530 AM.  Call this number if you have problems the morning of surgery: [phone]   Remember:   Do not eat food or drink liquids after midnight.   Take these medicines the morning of surgery with A SIP OF WATER: norvasc, neurontin, tylenol #3 if needed  Stop taking aspirin, fish oil, over the counter vitamins/herbal medications as of today.   Do not wear jewelry, make-up or nail polish.  Do not wear lotions, powders, or perfumes,deodorant.  Do not shave 48 hours prior to surgery. Men may shave face and neck.  Do not bring valuables to the hospital.  [facility] is not responsible  for any belongings or valuables.               Contacts, dentures or bridgework may not be worn into surgery.  Leave suitcase in the car. After surgery it may be brought to your room.  For patients admitted to the hospital, discharge time is determined by your  treatment team.               Patients discharged the day of surgery will not be allowed to drive home.  Please read over the following fact sheets that you were given: Pain Booklet, Coughing and Deep Breathing, MRSA Information and Surgical Site Infection Prevention [place] - Preparing for Surgery  Before surgery, you can play an important role.  Because skin is not sterile, your skin needs to be as free of germs as possible.  You can reduce the number of germs on you skin by washing with CHG (chlorahexidine gluconate) soap before surgery.  CHG is an antiseptic cleaner which kills germs and bonds with the skin to continue killing germs even after washing.  Please DO NOT use if you have an allergy to CHG or antibacterial soaps.  If your skin becomes reddened/irritated stop using the CHG and inform your nurse when you arrive at Short Stay.  Do not shave (including legs and underarms) for at least 48 hours prior to the first CHG shower.  You  may shave your face.  Please follow these instructions carefully:   1.  Shower with CHG Soap the night before surgery and the morning of Surgery.  2.  If you choose to wash your hair, wash your hair first as usual with your normal shampoo.  3.  After you shampoo, rinse your hair and body thoroughly to remove the shampoo.  4.  Use CHG as you would any other liquid soap.  You can apply CHG directly to the skin and wash gently with scrungie or a clean washcloth.  5.  Apply the CHG Soap to your body ONLY FROM THE NECK DOWN.  Do not use on open wounds or open sores.  Avoid contact with your eyes, ears, mouth and genitals (private parts).  Wash genitals (private parts) with your normal soap.  6.  Wash thoroughly, paying special attention to the area where your surgery will be performed.  7.  Thoroughly rinse your body with warm water from the neck down.  8.  DO NOT shower/wash with your normal soap after using and rinsing off the CHG Soap.  9.  Pat yourself dry with a clean towel.            10.  Wear clean pajamas.            11.  Place clean sheets on your bed the night of your first shower and do not sleep with pets.  Day of Surgery  Do not apply any lotions/deodorants the morning of surgery.  Please wear clean clothes to the hospital/surgery center.

## 2013-06-06 NOTE — Progress Notes (Signed)
Primary physician - dr. Laurance Flatten [facility]) Does not have a cardiologist Had ekg over a year ago. No other cardiac testing

## 2013-06-13 MED ORDER — DEXAMETHASONE SODIUM PHOSPHATE 10 MG/ML IJ SOLN
10.0000 mg | INTRAMUSCULAR | Status: AC
  Administered 2013-06-14: 10 mg via INTRAVENOUS
  Filled 2013-06-13: qty 1

## 2013-06-13 MED ORDER — CEFAZOLIN SODIUM-DEXTROSE 2-3 GM-% IV SOLR
2.0000 g | INTRAVENOUS | Status: AC
  Administered 2013-06-14: 2 g via INTRAVENOUS
  Filled 2013-06-13: qty 50

## 2013-06-14 ENCOUNTER — Encounter (HOSPITAL_COMMUNITY)
Admission: RE | Disposition: A | Discharge status: Home / Self Care | Payer: Self-pay | Source: Ambulatory Visit | Attending: Neurosurgery

## 2013-06-14 ENCOUNTER — Inpatient Hospital Stay (HOSPITAL_COMMUNITY)
Admission: RE | Admit: 2013-06-14 | Discharge: 2013-06-15 | DRG: 520 | Disposition: A | Discharge status: Home / Self Care | Payer: Medicare Other | Source: Ambulatory Visit | Attending: Neurosurgery | Admitting: Neurosurgery

## 2013-06-14 ENCOUNTER — Inpatient Hospital Stay (HOSPITAL_COMMUNITY): Payer: Medicare Other

## 2013-06-14 ENCOUNTER — Encounter (HOSPITAL_COMMUNITY): Payer: Self-pay | Admitting: *Deleted

## 2013-06-14 ENCOUNTER — Inpatient Hospital Stay (HOSPITAL_COMMUNITY): Payer: Medicare Other | Admitting: Anesthesiology

## 2013-06-14 ENCOUNTER — Encounter (HOSPITAL_COMMUNITY): Payer: Medicare Other | Admitting: Anesthesiology

## 2013-06-14 DIAGNOSIS — Z87891 Personal history of nicotine dependence: Secondary | ICD-10-CM | POA: Diagnosis not present

## 2013-06-14 DIAGNOSIS — K219 Gastro-esophageal reflux disease without esophagitis: Secondary | ICD-10-CM | POA: Diagnosis not present

## 2013-06-14 DIAGNOSIS — I10 Essential (primary) hypertension: Secondary | ICD-10-CM | POA: Diagnosis not present

## 2013-06-14 DIAGNOSIS — E785 Hyperlipidemia, unspecified: Secondary | ICD-10-CM | POA: Diagnosis not present

## 2013-06-14 DIAGNOSIS — M539 Dorsopathy, unspecified: Secondary | ICD-10-CM | POA: Diagnosis not present

## 2013-06-14 DIAGNOSIS — Z79899 Other long term (current) drug therapy: Secondary | ICD-10-CM

## 2013-06-14 DIAGNOSIS — M5126 Other intervertebral disc displacement, lumbar region: Secondary | ICD-10-CM | POA: Diagnosis not present

## 2013-06-14 DIAGNOSIS — Z7982 Long term (current) use of aspirin: Secondary | ICD-10-CM | POA: Diagnosis not present

## 2013-06-14 DIAGNOSIS — M5137 Other intervertebral disc degeneration, lumbosacral region: Secondary | ICD-10-CM | POA: Diagnosis not present

## 2013-06-14 HISTORY — PX: LUMBAR LAMINECTOMY/DECOMPRESSION MICRODISCECTOMY: SHX5026

## 2013-06-14 SURGERY — LUMBAR LAMINECTOMY/DECOMPRESSION MICRODISCECTOMY 1 LEVEL
Anesthesia: General | Site: Back | Laterality: Left

## 2013-06-14 MED ORDER — LIDOCAINE HCL (CARDIAC) 20 MG/ML IV SOLN
INTRAVENOUS | Status: DC | PRN
  Administered 2013-06-14: 100 mg via INTRAVENOUS

## 2013-06-14 MED ORDER — KCL IN DEXTROSE-NACL 20-5-0.45 MEQ/L-%-% IV SOLN
80.0000 mL/h | INTRAVENOUS | Status: DC
  Filled 2013-06-14 (×3): qty 1000

## 2013-06-14 MED ORDER — HEMOSTATIC AGENTS (NO CHARGE) OPTIME
TOPICAL | Status: DC | PRN
  Administered 2013-06-14: 1 via TOPICAL

## 2013-06-14 MED ORDER — ONDANSETRON HCL 4 MG/2ML IJ SOLN: 4.0000 mg | Freq: Once | INTRAMUSCULAR | Status: DC | PRN

## 2013-06-14 MED ORDER — PANTOPRAZOLE SODIUM 40 MG IV SOLR
40.0000 mg | Freq: Every day | INTRAVENOUS | Status: DC
  Administered 2013-06-14: 40 mg via INTRAVENOUS
  Filled 2013-06-14 (×2): qty 40

## 2013-06-14 MED ORDER — OXYCODONE HCL 5 MG/5ML PO SOLN: 5.0000 mg | Freq: Once | ORAL | Status: DC | PRN

## 2013-06-14 MED ORDER — DEXAMETHASONE 4 MG PO TABS
4.0000 mg | ORAL_TABLET | Freq: Four times a day (QID) | ORAL | Status: AC
  Administered 2013-06-14 (×2): 4 mg via ORAL
  Filled 2013-06-14 (×2): qty 1

## 2013-06-14 MED ORDER — HYDROMORPHONE HCL PF 1 MG/ML IJ SOLN: 0.2500 mg | INTRAMUSCULAR | Status: DC | PRN

## 2013-06-14 MED ORDER — EPHEDRINE SULFATE 50 MG/ML IJ SOLN
INTRAMUSCULAR | Status: AC
  Filled 2013-06-14: qty 1

## 2013-06-14 MED ORDER — ATORVASTATIN CALCIUM 40 MG PO TABS
40.0000 mg | ORAL_TABLET | Freq: Every day | ORAL | Status: DC
  Administered 2013-06-14: 40 mg via ORAL
  Filled 2013-06-14 (×2): qty 1

## 2013-06-14 MED ORDER — MIDAZOLAM HCL 2 MG/2ML IJ SOLN
INTRAMUSCULAR | Status: AC
  Filled 2013-06-14: qty 2

## 2013-06-14 MED ORDER — HYDROMORPHONE HCL PF 1 MG/ML IJ SOLN: 1.0000 mg | INTRAMUSCULAR | Status: DC | PRN

## 2013-06-14 MED ORDER — ARTIFICIAL TEARS OP OINT
TOPICAL_OINTMENT | OPHTHALMIC | Status: AC
  Filled 2013-06-14: qty 3.5

## 2013-06-14 MED ORDER — GLYCOPYRROLATE 0.2 MG/ML IJ SOLN
INTRAMUSCULAR | Status: AC
  Filled 2013-06-14: qty 1

## 2013-06-14 MED ORDER — CYCLOBENZAPRINE HCL 10 MG PO TABS: 10.0000 mg | ORAL_TABLET | Freq: Three times a day (TID) | ORAL | Status: DC | PRN

## 2013-06-14 MED ORDER — AMLODIPINE BESYLATE 10 MG PO TABS
10.0000 mg | ORAL_TABLET | Freq: Every day | ORAL | Status: DC
  Administered 2013-06-15: 10 mg via ORAL
  Filled 2013-06-14: qty 1

## 2013-06-14 MED ORDER — LACTATED RINGERS IV SOLN
INTRAVENOUS | Status: DC | PRN
  Administered 2013-06-14 (×2): via INTRAVENOUS

## 2013-06-14 MED ORDER — MIDAZOLAM HCL 5 MG/5ML IJ SOLN
INTRAMUSCULAR | Status: DC | PRN
  Administered 2013-06-14: 1 mg via INTRAVENOUS

## 2013-06-14 MED ORDER — PROPOFOL 10 MG/ML IV BOLUS
INTRAVENOUS | Status: AC
  Filled 2013-06-14: qty 20

## 2013-06-14 MED ORDER — CEFAZOLIN SODIUM 1-5 GM-% IV SOLN
1.0000 g | Freq: Three times a day (TID) | INTRAVENOUS | Status: AC
  Administered 2013-06-14 (×2): 1 g via INTRAVENOUS
  Filled 2013-06-14 (×2): qty 50

## 2013-06-14 MED ORDER — SODIUM CHLORIDE 0.9 % IJ SOLN: 3.0000 mL | INTRAMUSCULAR | Status: DC | PRN

## 2013-06-14 MED ORDER — ACETAMINOPHEN 325 MG PO TABS: 650.0000 mg | ORAL_TABLET | ORAL | Status: DC | PRN

## 2013-06-14 MED ORDER — THROMBIN 5000 UNITS EX SOLR
CUTANEOUS | Status: DC | PRN
  Administered 2013-06-14 (×2): 5000 [IU] via TOPICAL

## 2013-06-14 MED ORDER — BUPIVACAINE HCL (PF) 0.5 % IJ SOLN
INTRAMUSCULAR | Status: DC | PRN
  Administered 2013-06-14: 20 mL

## 2013-06-14 MED ORDER — OXYCODONE HCL 5 MG PO TABS: 5.0000 mg | ORAL_TABLET | Freq: Once | ORAL | Status: DC | PRN

## 2013-06-14 MED ORDER — SUCCINYLCHOLINE CHLORIDE 20 MG/ML IJ SOLN
INTRAMUSCULAR | Status: AC
  Filled 2013-06-14: qty 1

## 2013-06-14 MED ORDER — KETOROLAC TROMETHAMINE 15 MG/ML IJ SOLN
INTRAMUSCULAR | Status: DC | PRN
  Administered 2013-06-14: 15 mg via INTRAVENOUS

## 2013-06-14 MED ORDER — ONDANSETRON HCL 4 MG/2ML IJ SOLN: 4.0000 mg | INTRAMUSCULAR | Status: DC | PRN

## 2013-06-14 MED ORDER — SODIUM CHLORIDE 0.9 % IJ SOLN
3.0000 mL | Freq: Two times a day (BID) | INTRAMUSCULAR | Status: DC
  Administered 2013-06-14 (×2): 3 mL via INTRAVENOUS

## 2013-06-14 MED ORDER — GABAPENTIN 300 MG PO CAPS
300.0000 mg | ORAL_CAPSULE | Freq: Every day | ORAL | Status: DC
  Administered 2013-06-15: 300 mg via ORAL
  Filled 2013-06-14: qty 1

## 2013-06-14 MED ORDER — ONDANSETRON HCL 4 MG/2ML IJ SOLN
INTRAMUSCULAR | Status: DC | PRN
  Administered 2013-06-14: 4 mg via INTRAVENOUS

## 2013-06-14 MED ORDER — PROPOFOL 10 MG/ML IV BOLUS
INTRAVENOUS | Status: DC | PRN
  Administered 2013-06-14: 100 mg via INTRAVENOUS

## 2013-06-14 MED ORDER — DEXAMETHASONE SODIUM PHOSPHATE 4 MG/ML IJ SOLN: 4.0000 mg | Freq: Four times a day (QID) | INTRAMUSCULAR | Status: AC

## 2013-06-14 MED ORDER — PHENOL 1.4 % MT LIQD: 1.0000 | OROMUCOSAL | Status: DC | PRN

## 2013-06-14 MED ORDER — ROCURONIUM BROMIDE 50 MG/5ML IV SOLN
INTRAVENOUS | Status: AC
  Filled 2013-06-14: qty 1

## 2013-06-14 MED ORDER — ROCURONIUM BROMIDE 100 MG/10ML IV SOLN
INTRAVENOUS | Status: DC | PRN
  Administered 2013-06-14: 50 mg via INTRAVENOUS

## 2013-06-14 MED ORDER — NEOSTIGMINE METHYLSULFATE 1 MG/ML IJ SOLN
INTRAMUSCULAR | Status: DC | PRN
  Administered 2013-06-14: 4 mg via INTRAVENOUS

## 2013-06-14 MED ORDER — ARTIFICIAL TEARS OP OINT
TOPICAL_OINTMENT | OPHTHALMIC | Status: DC | PRN
  Administered 2013-06-14: 1 via OPHTHALMIC

## 2013-06-14 MED ORDER — GLYCOPYRROLATE 0.2 MG/ML IJ SOLN
INTRAMUSCULAR | Status: DC | PRN
  Administered 2013-06-14: 0.6 mg via INTRAVENOUS

## 2013-06-14 MED ORDER — FENTANYL CITRATE 0.05 MG/ML IJ SOLN
INTRAMUSCULAR | Status: AC
  Filled 2013-06-14: qty 5

## 2013-06-14 MED ORDER — ONDANSETRON HCL 4 MG/2ML IJ SOLN
INTRAMUSCULAR | Status: AC
  Filled 2013-06-14: qty 2

## 2013-06-14 MED ORDER — FENTANYL CITRATE 0.05 MG/ML IJ SOLN
INTRAMUSCULAR | Status: DC | PRN
  Administered 2013-06-14: 100 ug via INTRAVENOUS
  Administered 2013-06-14 (×2): 50 ug via INTRAVENOUS

## 2013-06-14 MED ORDER — MENTHOL 3 MG MT LOZG: 1.0000 | LOZENGE | OROMUCOSAL | Status: DC | PRN

## 2013-06-14 MED ORDER — SODIUM CHLORIDE 0.9 % IR SOLN
Status: DC | PRN
  Administered 2013-06-14: 07:00:00

## 2013-06-14 MED ORDER — HYDROCODONE-ACETAMINOPHEN 5-325 MG PO TABS
1.0000 | ORAL_TABLET | ORAL | Status: DC | PRN
  Administered 2013-06-14 – 2013-06-15 (×3): 2 via ORAL
  Administered 2013-06-15: 1 via ORAL
  Filled 2013-06-14 (×2): qty 2
  Filled 2013-06-14: qty 1
  Filled 2013-06-14: qty 2

## 2013-06-14 MED ORDER — SODIUM CHLORIDE 0.9 % IJ SOLN
INTRAMUSCULAR | Status: AC
  Filled 2013-06-14: qty 10

## 2013-06-14 MED ORDER — 0.9 % SODIUM CHLORIDE (POUR BTL) OPTIME
TOPICAL | Status: DC | PRN
  Administered 2013-06-14: 1000 mL

## 2013-06-14 MED ORDER — LIDOCAINE HCL (CARDIAC) 20 MG/ML IV SOLN
INTRAVENOUS | Status: AC
  Filled 2013-06-14: qty 5

## 2013-06-14 MED ORDER — PHENYLEPHRINE 40 MCG/ML (10ML) SYRINGE FOR IV PUSH (FOR BLOOD PRESSURE SUPPORT)
PREFILLED_SYRINGE | INTRAVENOUS | Status: AC
  Filled 2013-06-14: qty 10

## 2013-06-14 MED ORDER — PHENYLEPHRINE HCL 10 MG/ML IJ SOLN
INTRAMUSCULAR | Status: DC | PRN
  Administered 2013-06-14 (×5): 40 ug via INTRAVENOUS
  Administered 2013-06-14: 80 ug via INTRAVENOUS

## 2013-06-14 MED ORDER — MEPERIDINE HCL 25 MG/ML IJ SOLN: 6.2500 mg | INTRAMUSCULAR | Status: DC | PRN

## 2013-06-14 MED ORDER — KETOROLAC TROMETHAMINE 30 MG/ML IJ SOLN
15.0000 mg | Freq: Four times a day (QID) | INTRAMUSCULAR | Status: DC
  Administered 2013-06-14 – 2013-06-15 (×4): 15 mg via INTRAVENOUS
  Filled 2013-06-14 (×7): qty 1

## 2013-06-14 MED ORDER — ACETAMINOPHEN 650 MG RE SUPP: 650.0000 mg | RECTAL | Status: DC | PRN

## 2013-06-14 SURGICAL SUPPLY — 48 items
BAG DECANTER FOR FLEXI CONT (MISCELLANEOUS) ×3 IMPLANT
BENZOIN TINCTURE PRP APPL 2/3 (GAUZE/BANDAGES/DRESSINGS) ×3 IMPLANT
BLADE SURG ROTATE 9660 (MISCELLANEOUS) ×3 IMPLANT
BRUSH SCRUB EZ PLAIN DRY (MISCELLANEOUS) ×3 IMPLANT
BUR CUTTER 7.0 ROUND (BURR) ×3 IMPLANT
BUR MATCHSTICK NEURO 3.0 LAGG (BURR) ×3 IMPLANT
CANISTER SUCT 3000ML (MISCELLANEOUS) ×3 IMPLANT
CLOSURE WOUND 1/2 X4 (GAUZE/BANDAGES/DRESSINGS) ×1
CONT SPEC 4OZ CLIKSEAL STRL BL (MISCELLANEOUS) ×3 IMPLANT
DERMABOND ADVANCED (GAUZE/BANDAGES/DRESSINGS) ×2
DERMABOND ADVANCED .7 DNX12 (GAUZE/BANDAGES/DRESSINGS) ×1 IMPLANT
DRAPE LAPAROTOMY 100X72X124 (DRAPES) ×3 IMPLANT
DRAPE MICROSCOPE ZEISS OPMI (DRAPES) ×3 IMPLANT
DRAPE SURG 17X23 STRL (DRAPES) ×6 IMPLANT
DRESSING TELFA 8X3 (GAUZE/BANDAGES/DRESSINGS) ×3 IMPLANT
DRSG OPSITE POSTOP 4X6 (GAUZE/BANDAGES/DRESSINGS) ×3 IMPLANT
DURAPREP 26ML APPLICATOR (WOUND CARE) ×3 IMPLANT
ELECT REM PT RETURN 9FT ADLT (ELECTROSURGICAL) ×3
ELECTRODE REM PT RTRN 9FT ADLT (ELECTROSURGICAL) ×1 IMPLANT
GAUZE SPONGE 4X4 16PLY XRAY LF (GAUZE/BANDAGES/DRESSINGS) IMPLANT
GLOVE ECLIPSE 8.0 STRL XLNG CF (GLOVE) ×3 IMPLANT
GLOVE INDICATOR 7.5 STRL GRN (GLOVE) ×6 IMPLANT
GLOVE SS BIOGEL STRL SZ 7 (GLOVE) ×2 IMPLANT
GLOVE SUPERSENSE BIOGEL SZ 7 (GLOVE) ×4
GOWN BRE IMP SLV AUR LG STRL (GOWN DISPOSABLE) IMPLANT
GOWN BRE IMP SLV AUR XL STRL (GOWN DISPOSABLE) IMPLANT
GOWN STRL REIN 2XL LVL4 (GOWN DISPOSABLE) IMPLANT
GOWN STRL REUS W/ TWL LRG LVL3 (GOWN DISPOSABLE) ×1 IMPLANT
GOWN STRL REUS W/TWL LRG LVL3 (GOWN DISPOSABLE) ×2
GOWN STRL REUS W/TWL MED LVL3 (GOWN DISPOSABLE) ×3 IMPLANT
KIT BASIN OR (CUSTOM PROCEDURE TRAY) ×3 IMPLANT
KIT ROOM TURNOVER OR (KITS) ×3 IMPLANT
NEEDLE HYPO 22GX1.5 SAFETY (NEEDLE) ×3 IMPLANT
NEEDLE SPNL 22GX3.5 QUINCKE BK (NEEDLE) ×6 IMPLANT
NS IRRIG 1000ML POUR BTL (IV SOLUTION) ×3 IMPLANT
PACK LAMINECTOMY NEURO (CUSTOM PROCEDURE TRAY) ×3 IMPLANT
PAD ARMBOARD 7.5X6 YLW CONV (MISCELLANEOUS) ×9 IMPLANT
PATTIES SURGICAL .75X.75 (GAUZE/BANDAGES/DRESSINGS) ×3 IMPLANT
RUBBERBAND STERILE (MISCELLANEOUS) ×6 IMPLANT
SPONGE GAUZE 4X4 12PLY (GAUZE/BANDAGES/DRESSINGS) ×3 IMPLANT
SPONGE SURGIFOAM ABS GEL SZ50 (HEMOSTASIS) ×3 IMPLANT
STRIP CLOSURE SKIN 1/2X4 (GAUZE/BANDAGES/DRESSINGS) ×2 IMPLANT
SUT VIC AB 2-0 OS6 18 (SUTURE) ×9 IMPLANT
SUT VIC AB 3-0 CP2 18 (SUTURE) ×3 IMPLANT
SYR 20ML ECCENTRIC (SYRINGE) ×3 IMPLANT
TOWEL OR 17X24 6PK STRL BLUE (TOWEL DISPOSABLE) ×3 IMPLANT
TOWEL OR 17X26 10 PK STRL BLUE (TOWEL DISPOSABLE) ×3 IMPLANT
WATER STERILE IRR 1000ML POUR (IV SOLUTION) ×3 IMPLANT

## 2013-06-14 NOTE — Transfer of Care (Signed)
Immediate Anesthesia Transfer of Care Note  Patient: Anita Perez  Procedure(s) Performed: Procedure(s): LUMBAR LAMINECTOMY/DECOMPRESSION MICRODISCECTOMY LEFT LUMBAR FOUR-FIVE (Left)  Patient Location: PACU  Anesthesia Type:General  Level of Consciousness: sedated and patient cooperative  Airway & Oxygen Therapy: Patient Spontanous Breathing and Patient connected to nasal cannula oxygen  Post-op Assessment: Report given to PACU RN and Post -op Vital signs reviewed and stable  Post vital signs: Reviewed  Complications: No apparent anesthesia complications

## 2013-06-14 NOTE — Anesthesia Procedure Notes (Signed)

## 2013-06-14 NOTE — Anesthesia Postprocedure Evaluation (Signed)
Anesthesia Post Note  Patient: Anita Perez  Procedure(s) Performed: Procedure(s) (LRB): LUMBAR LAMINECTOMY/DECOMPRESSION MICRODISCECTOMY LEFT LUMBAR FOUR-FIVE (Left)  Anesthesia type: general  Patient location: PACU  Post pain: Pain level controlled  Post assessment: Patient's Cardiovascular Status Stable  Last Vitals:  Filed Vitals:   06/14/13 1230  BP: 174/71  Pulse: 73  Temp: 36.7 C  Resp: 20    Post vital signs: Reviewed and stable  Level of consciousness: sedated  Complications: No apparent anesthesia complications

## 2013-06-14 NOTE — Progress Notes (Signed)
Utilization review completed.  

## 2013-06-14 NOTE — Anesthesia Preprocedure Evaluation (Addendum)
Anesthesia Evaluation  Patient identified by MRN, date of birth, ID band Patient awake    Reviewed: Allergy & Precautions, H&P , NPO status , Patient's Chart, lab work & pertinent test results  History of Anesthesia Complications Negative for: history of anesthetic complications  Airway Mallampati: II TM Distance: >3 FB Neck ROM: Full    Dental  (+) Edentulous Upper, Dental Advisory Given   Pulmonary former smoker,  breath sounds clear to auscultation        Cardiovascular hypertension, Pt. on medications Rhythm:Regular Rate:Normal     Neuro/Psych Left leg pain. negative psych ROS   GI/Hepatic Neg liver ROS, GERD-  Medicated and Controlled,  Endo/Other  negative endocrine ROS  Renal/GU negative Renal ROS     Musculoskeletal   Abdominal   Peds  Hematology   Anesthesia Other Findings   Reproductive/Obstetrics negative OB ROS                          Anesthesia Physical Anesthesia Plan  ASA: II  Anesthesia Plan: General   Post-op Pain Management:    Induction: Intravenous  Airway Management Planned: Oral ETT  Additional Equipment:   Intra-op Plan:   Post-operative Plan: Extubation in OR  Informed Consent: I have reviewed the patients History and Physical, chart, labs and discussed the procedure including the risks, benefits and alternatives for the proposed anesthesia with the patient or authorized representative who has indicated his/her understanding and acceptance.     Plan Discussed with: CRNA and Surgeon  Anesthesia Plan Comments:         Anesthesia Quick Evaluation

## 2013-06-14 NOTE — Addendum Note (Signed)
Addendum created 06/14/13 1438 by Jenne Campus, CRNA   Modules edited: Anesthesia Flowsheet

## 2013-06-14 NOTE — Preoperative (Signed)
Beta Blockers   Reason not to administer Beta Blockers:Not Applicable 

## 2013-06-14 NOTE — H&P (Signed)
Anita Perez is an 78 y.o. female.   Chief Complaint: Left leg pain HPI: The patient is 78 year old female who presented to the office complaining of a recent history of back pain radiating down her left leg. She been tried on aggressive conservative therapy which gave her no improvement. General imaging studies which showed a disc herniation L4-5 on the left extending centrally with an inferior fragment. After failing further conservative therapy the patient requested surgery now comes for a lumbar microdiscectomy. I had a long discussion with her regarding the risks and benefits of surgical intervention. The risks discussed include but are not limited to bleeding infection weakness numbness paralysis spinal fluid leak coma and death. We have discussed alternative methods of therapy along the risks and benefits of nonintervention. She's had the opportunity to ask numerous questions and appears to understand. With this information in hand she has requested that we proceed with surgery.  Past Medical History  Diagnosis Date  . Hypertension   . GERD (gastroesophageal reflux disease)   . Hiatal hernia   . Rectal carcinoid tumor   . Colon polyps   . Hyperlipidemia   . DDD (degenerative disc disease), lumbar     Past Surgical History  Procedure Laterality Date  . Breast surgery      cyst removed  . Knee surgery Right     arthroscopic  . Fatty tumor removed left side    . Intestinal  tear    . Appendectomy    . Cosmetic surgery Left     hand  . Colon surgery      polyp/ colostomy (removed now)    Family History  Problem Relation Age of Onset  . Kidney disease Mother   . Hypertension Mother   . Kidney disease Father   . Hypertension Father   . Cancer Sister   . Cancer Sister    Social History:  reports that she quit smoking about 30 years ago. Her smoking use included Cigarettes. She smoked 0.00 packs per day. She does not have any smokeless tobacco history on file. She reports  that she drinks alcohol. She reports that she does not use illicit drugs.  Allergies:  Allergies  Allergen Reactions  . Pepto-Bismol [Bismuth] Other (See Comments)    Turned mouth black inside    Medications Prior to Admission  Medication Sig Dispense Refill  . acetaminophen-codeine (TYLENOL #3) 300-30 MG per tablet Take 1 tablet by mouth every 4 (four) hours as needed for moderate pain.  30 tablet  0  . amLODipine (NORVASC) 10 MG tablet Take 1 tablet (10 mg total) by mouth daily.  90 tablet  1  . aspirin 81 MG tablet Take 81 mg by mouth daily.      Marland Kitchen atorvastatin (LIPITOR) 40 MG tablet Take 1 tablet (40 mg total) by mouth daily.  90 tablet  1  . Calcium Carbonate-Vitamin D (CALCIUM + D PO) Take 600 mg by mouth daily.      . Cholecalciferol (VITAMIN D) 2000 UNITS CAPS Take 3,000 Units by mouth daily.      . fish oil-omega-3 fatty acids 1000 MG capsule Take 1 g by mouth daily.      Marland Kitchen gabapentin (NEURONTIN) 300 MG capsule Take 300 mg by mouth daily.        No results found for this or any previous visit (from the past 48 hour(s)). No results found.  Review of systems not obtained due to patient factors.  Blood pressure 185/91, pulse  84, temperature 98.1 F (36.7 C), resp. rate 20, SpO2 99.00%.  The patient is awake or and oriented. His no facial asymmetry. Her gait is nonantalgic. Reflexes are decreased but equal. She has a decreased strength of the extensor hallucis long some left Assessment/Plan Impression is that of an L5 radiculopathy do a herniated disc L4-5 with inferior fragment. The plan is for a left L4-5 microdiscectomy.  Faythe Ghee, MD 06/14/2013, 7:29 AM

## 2013-06-14 NOTE — Plan of Care (Signed)
Problem: Consults Goal: Diagnosis - Spinal Surgery Outcome: Completed/Met Date Met:  06/14/13 Microdiscectomy

## 2013-06-14 NOTE — Op Note (Signed)
Preop diagnosis: Herniated disc L4-5 left with marked L5 nerve root compression Postop diagnosis: Same Procedure: Left L4-5 intralaminar laminotomy for excision of herniated disc and decompression of L5 nerve root Surgeon: Payson Evrard Assistant: Elsner  After being placed the prone position the patient's back was prepped and draped in the usual sterile fashion. Localizing x-ray was taken prior to incision to identify the appropriate level. Midline incision was made above the spinous processes of L4 and L5. Using Bovie cutting current the incision was carried on the spinous processes. Subperiosteal dissection was then carried out on the left side of the spinous processes and lamina and self-retaining tract was placed for exposure. X-ray showed approach the appropriate level. Using the high-speed drill the inferior one third of the L4 lamina the medial one third of the facet joint and the superior two thirds of the L5 lamina were removed. Residual bone and ligamentum flavum removed in a piecemeal fashion. The microscope was draped brought into the field and used for the remainder of the case. Using microdissection technique the lateral aspect of the thecal sac and L5 nerve root were identified. Further coagulation was carried out down before the canal to identify the L4-5 disc which was found to be tremendously herniated. After coagulating on the annulus the S. was incised a 15 blade. Using pituitary rongeurs and curettes a thorough displaced cleanout was carried out while the same time great care was taken to avoid injury to the neural elements as this was successfully done. At this time inspection was carried out in her to the disc space beneath the L5 nerve root were free fragment disc was identified and removed. At this time inspection was carried out in all directions for any evidence of residual compression and none could be identified. Irrigation was carried out and any bleeding control proper coagulation  Gelfoam. The wound was then closed in multiple layers of Vicryl on the muscle fascia subcutaneous and subcuticular tissues. Dermabond and Steri-Strips were placed on the skin. A sterile dressing was then applied and the patient was extubated and taken to recovery room in stable condition.

## 2013-06-15 MED ORDER — PANTOPRAZOLE SODIUM 40 MG PO TBEC: 40.0000 mg | DELAYED_RELEASE_TABLET | Freq: Every day | ORAL | Status: DC

## 2013-06-15 MED ORDER — HYDROCODONE-ACETAMINOPHEN 5-325 MG PO TABS: 1.0000 | ORAL_TABLET | ORAL | Status: DC | PRN

## 2013-06-15 NOTE — Progress Notes (Signed)
Pt. Alert and oriented, follows simple instructions, denies pain. Incision area without swelling, redness or S/S of infection. Voiding adequate clear yellow urine. Moving all extremities well and vitals stable and documented. Patient discharged home with family. Lumbar  surgery notes instructions given to patient and family member for home safety and precautions. Pt. and family stated understanding of instructions given. Pain meds given per Pt.'s request for pain and discomfort of ride home 

## 2013-06-15 NOTE — Discharge Summary (Signed)
  Physician Discharge Summary  Patient ID: Anita Perez MRN: [id number] DOB/AGE: 12/06/1935 78 y.o.  Admit date: 06/14/2013 Discharge date: 06/15/2013  Admission Diagnoses:  Discharge Diagnoses:  Active Problems:   Lumbar disc herniation   Discharged Condition: good  Hospital Course: Surgery Tuesday for herniated lumbar disc. Did great. No issues post op. Pain relieved. Ambulated well. Home pod 1, specific instructions given.  Consults: None  Significant Diagnostic Studies: none  Treatments: surgery: L 45 discectomy  Discharge Exam: Blood pressure 139/72, pulse 71, temperature 98.7 F (37.1 C), temperature source Oral, resp. rate 18, SpO2 93.00%. Incision/Wound:clean and dry; no new weakness or numbness  Disposition: Final discharge disposition not confirmed     Medication List    ASK your doctor about these medications       acetaminophen-codeine 300-30 MG per tablet  Commonly known as:  TYLENOL #3  Take 1 tablet by mouth every 4 (four) hours as needed for moderate pain.     amLODipine 10 MG tablet  Commonly known as:  NORVASC  Take 1 tablet (10 mg total) by mouth daily.     aspirin 81 MG tablet  Take 81 mg by mouth daily.     atorvastatin 40 MG tablet  Commonly known as:  LIPITOR  Take 1 tablet (40 mg total) by mouth daily.     CALCIUM + D PO  Take 600 mg by mouth daily.     fish oil-omega-3 fatty acids 1000 MG capsule  Take 1 g by mouth daily.     gabapentin 300 MG capsule  Commonly known as:  NEURONTIN  Take 300 mg by mouth daily.     Vitamin D 2000 UNITS Caps  Take 3,000 Units by mouth daily.         At home rest most of the time. Get up 9 or 10 times each day and take a 15 or 20 minute walk. No riding in the car and to your first postoperative appointment. If you have neck surgery you may shower from the chest down starting on the third postoperative day. If you had back surgery he may start showering on the third postoperative day with saran  wrap wrapped around your incisional area 3 times. After the shower remove the saran wrap. Take pain medicine as needed and other medications as instructed. Call my office for an appointment.  SignedFaythe Ghee, MD 06/15/2013, 8:49 AM

## 2013-06-17 ENCOUNTER — Encounter (HOSPITAL_COMMUNITY): Payer: Self-pay | Admitting: Neurosurgery

## 2013-12-02 ENCOUNTER — Ambulatory Visit (INDEPENDENT_AMBULATORY_CARE_PROVIDER_SITE_OTHER): Payer: Medicare Other | Admitting: Nurse Practitioner

## 2013-12-02 ENCOUNTER — Ambulatory Visit (INDEPENDENT_AMBULATORY_CARE_PROVIDER_SITE_OTHER): Payer: Medicare Other

## 2013-12-02 ENCOUNTER — Encounter: Payer: Self-pay | Admitting: Nurse Practitioner

## 2013-12-02 VITALS — BP 160/90 | HR 94 | Temp 97.1°F | Ht 63.0 in | Wt 155.0 lb

## 2013-12-02 DIAGNOSIS — E785 Hyperlipidemia, unspecified: Secondary | ICD-10-CM | POA: Diagnosis not present

## 2013-12-02 DIAGNOSIS — I10 Essential (primary) hypertension: Secondary | ICD-10-CM | POA: Insufficient documentation

## 2013-12-02 DIAGNOSIS — M25569 Pain in unspecified knee: Secondary | ICD-10-CM

## 2013-12-02 DIAGNOSIS — Z13820 Encounter for screening for osteoporosis: Secondary | ICD-10-CM | POA: Diagnosis not present

## 2013-12-02 DIAGNOSIS — Z6827 Body mass index (BMI) 27.0-27.9, adult: Secondary | ICD-10-CM

## 2013-12-02 DIAGNOSIS — M25561 Pain in right knee: Secondary | ICD-10-CM

## 2013-12-02 DIAGNOSIS — Z713 Dietary counseling and surveillance: Secondary | ICD-10-CM

## 2013-12-02 DIAGNOSIS — G629 Polyneuropathy, unspecified: Secondary | ICD-10-CM | POA: Insufficient documentation

## 2013-12-02 MED ORDER — AMLODIPINE BESYLATE 10 MG PO TABS: 10.0000 mg | ORAL_TABLET | Freq: Every day | ORAL | Status: DC

## 2013-12-02 MED ORDER — ATORVASTATIN CALCIUM 40 MG PO TABS: 40.0000 mg | ORAL_TABLET | Freq: Every day | ORAL | Status: DC

## 2013-12-02 NOTE — Progress Notes (Signed)
Subjective:    Patient ID: Anita Perez, female    DOB: 07-12-1935, 78 y.o.   MRN: [id number]  Patient here today for follow up of chronic medical problems.   Hypertension This is a chronic problem. The current episode started more than 1 year ago. The problem is unchanged. The problem is controlled (only gpoes up when she comes to the doctor). Pertinent negatives include no blurred vision, headaches, neck pain, palpitations, peripheral edema or shortness of breath. Risk factors for coronary artery disease include dyslipidemia, post-menopausal state and sedentary lifestyle. Past treatments include calcium channel blockers. The current treatment provides moderate improvement. Compliance problems include diet and exercise.   Hyperlipidemia This is a chronic problem. The current episode started more than 1 year ago. The problem is uncontrolled. Recent lipid tests were reviewed and are high. She has no history of diabetes, hypothyroidism or obesity. Pertinent negatives include no shortness of breath. Current antihyperlipidemic treatment includes statins. The current treatment provides moderate improvement of lipids. Compliance problems include adherence to diet and medication cost.  Risk factors for coronary artery disease include dyslipidemia, family history, hypertension, post-menopausal and a sedentary lifestyle.   * C/O right knee pain that started since she had an arthroscopy in 2012. Walking increases pain. Pain comes and goes.   Review of Systems  Eyes: Negative for blurred vision.  Respiratory: Negative for shortness of breath.   Cardiovascular: Negative for palpitations.  Musculoskeletal: Negative for neck pain.  Neurological: Negative for headaches.       Objective:   Physical Exam  Constitutional: She is oriented to person, place, and time. She appears well-developed and well-nourished.  HENT:  Nose: Nose normal.  Mouth/Throat: Oropharynx is clear and moist.  Eyes: EOM are  normal.  Neck: Trachea normal, normal range of motion and full passive range of motion without pain. Neck supple. No JVD present. Carotid bruit is not present. No thyromegaly present.  Cardiovascular: Normal rate, regular rhythm, normal heart sounds and intact distal pulses.  Exam reveals no gallop and no friction rub.   No murmur heard. Pulmonary/Chest: Effort normal and breath sounds normal.  Abdominal: Soft. Bowel sounds are normal. She exhibits no distension and no mass. There is no tenderness.  Musculoskeletal: Normal range of motion.  FROM of right knee with pain on full extension and flexion. No patella tenderness- mild joint effusion All ligaments appear intact  Lymphadenopathy:    She has no cervical adenopathy.  Neurological: She is alert and oriented to person, place, and time. She has normal reflexes.  Skin: Skin is warm and dry.  Psychiatric: She has a normal mood and affect. Her behavior is normal. Judgment and thought content normal.  BP 160/90  Pulse 94  Temp(Src) 97.1 F (36.2 C) (Oral)  Ht '5\' 3"'  (1.6 m)  Wt 155 lb (70.308 kg)  BMI 27.46 kg/m2  Right knee xray- joint space narrowing with b one to bone contact-Preliminary reading by Ronnald Collum, FNP  [facility]         Assessment & Plan:   1. Right knee pain   2. Hyperlipidemia with target LDL less than 100   3. Essential hypertension, benign   4. Screening for osteoporosis   5. BMI 27.0-27.9,adult   6. Weight loss counseling, encounter for    Orders Placed This Encounter  Procedures  . DG Knee 1-2 Views Right    Standing Status: Future     Number of Occurrences: 1     Standing Expiration Date: 02/01/2015  Order Specific Question:  Reason for Exam (SYMPTOM  OR DIAGNOSIS REQUIRED)    Answer:  knee pain    Order Specific Question:  Preferred imaging location?    Answer:  Internal  . DG Bone Density    Standing Status: Future     Number of Occurrences:      Standing Expiration Date: 02/01/2015    Order  Specific Question:  Reason for Exam (SYMPTOM  OR DIAGNOSIS REQUIRED)    Answer:  screening    Order Specific Question:  Preferred imaging location?    Answer:  Internal  . CMP14+EGFR  . NMR, lipoprofile   Meds ordered this encounter  Medications  . amLODipine (NORVASC) 10 MG tablet    Sig: Take 1 tablet (10 mg total) by mouth daily.    Dispense:  90 tablet    Refill:  1    Order Specific Question:  Supervising Provider    Answer:  Chipper Herb [[id number]]  . atorvastatin (LIPITOR) 40 MG tablet    Sig: Take 1 tablet (40 mg total) by mouth daily.    Dispense:  90 tablet    Refill:  1    Order Specific Question:  Supervising Provider    Answer:  Chipper Herb [[id number]]   Referral to ortho for knee Discussed weight management for patient with BMI> 25 Labs pending Health maintenance reviewed Diet and exercise encouraged Continue all meds Follow up  In 3 months   Reader, FNP

## 2013-12-02 NOTE — Patient Instructions (Signed)

## 2013-12-03 LAB — CMP14+EGFR
ALBUMIN: 4.4 g/dL (ref 3.5–4.8)
ALT: 16 IU/L (ref 0–32)
AST: 19 IU/L (ref 0–40)
Albumin/Globulin Ratio: 1.7 (ref 1.1–2.5)
Alkaline Phosphatase: 91 IU/L (ref 39–117)
BUN/Creatinine Ratio: 18 (ref 11–26)
BUN: 18 mg/dL (ref 8–27)
CHLORIDE: 101 mmol/L (ref 97–108)
CO2: 23 mmol/L (ref 18–29)
Calcium: 9.4 mg/dL (ref 8.7–10.3)
Creatinine, Ser: 0.99 mg/dL (ref 0.57–1.00)
GFR calc Af Amer: 64 mL/min/{1.73_m2} (ref >59)
GFR calc non Af Amer: 56 mL/min/{1.73_m2} — ABNORMAL LOW (ref >59)
GLOBULIN, TOTAL: 2.6 g/dL (ref 1.5–4.5)
Glucose: 87 mg/dL (ref 65–99)
Potassium: 3.9 mmol/L (ref 3.5–5.2)
Sodium: 141 mmol/L (ref 134–144)
TOTAL PROTEIN: 7 g/dL (ref 6.0–8.5)
Total Bilirubin: 0.5 mg/dL (ref 0.0–1.2)

## 2013-12-03 LAB — NMR, LIPOPROFILE
Cholesterol: 199 mg/dL (ref 100–199)
HDL CHOLESTEROL BY NMR: 51 mg/dL (ref >39)
HDL PARTICLE NUMBER: 40.1 umol/L (ref >=30.5)
LDL PARTICLE NUMBER: 1248 nmol/L — AB (ref <1000)
LDL Size: 20.6 nm (ref >20.5)
LDLC SERPL CALC-MCNC: 125 mg/dL — AB (ref 0–99)
LP-IR Score: 56 — ABNORMAL HIGH (ref <=45)
Small LDL Particle Number: 492 nmol/L (ref <=527)
TRIGLYCERIDES BY NMR: 113 mg/dL (ref 0–149)

## 2014-01-23 DIAGNOSIS — Z1231 Encounter for screening mammogram for malignant neoplasm of breast: Secondary | ICD-10-CM | POA: Diagnosis not present

## 2014-02-09 ENCOUNTER — Ambulatory Visit: Payer: Medicare Other

## 2014-02-09 ENCOUNTER — Ambulatory Visit (INDEPENDENT_AMBULATORY_CARE_PROVIDER_SITE_OTHER): Payer: Medicare Other | Admitting: *Deleted

## 2014-02-09 DIAGNOSIS — Z23 Encounter for immunization: Secondary | ICD-10-CM

## 2014-02-09 DIAGNOSIS — M1711 Unilateral primary osteoarthritis, right knee: Secondary | ICD-10-CM | POA: Diagnosis not present

## 2014-03-01 ENCOUNTER — Ambulatory Visit: Payer: Medicare Other

## 2014-06-21 ENCOUNTER — Ambulatory Visit (INDEPENDENT_AMBULATORY_CARE_PROVIDER_SITE_OTHER): Payer: Medicare Other | Admitting: Pharmacist

## 2014-06-21 ENCOUNTER — Other Ambulatory Visit: Payer: Self-pay | Admitting: Nurse Practitioner

## 2014-06-21 ENCOUNTER — Ambulatory Visit (INDEPENDENT_AMBULATORY_CARE_PROVIDER_SITE_OTHER): Payer: Medicare Other

## 2014-06-21 ENCOUNTER — Encounter: Payer: Self-pay | Admitting: Pharmacist

## 2014-06-21 VITALS — BP 132/72 | HR 78 | Ht 63.0 in | Wt 155.0 lb

## 2014-06-21 DIAGNOSIS — Z780 Asymptomatic menopausal state: Secondary | ICD-10-CM

## 2014-06-21 DIAGNOSIS — Z23 Encounter for immunization: Secondary | ICD-10-CM

## 2014-06-21 DIAGNOSIS — I10 Essential (primary) hypertension: Secondary | ICD-10-CM | POA: Diagnosis not present

## 2014-06-21 DIAGNOSIS — Z0000 Encounter for general adult medical examination without abnormal findings: Secondary | ICD-10-CM | POA: Diagnosis not present

## 2014-06-21 DIAGNOSIS — Z860100 Personal history of colon polyps, unspecified: Secondary | ICD-10-CM

## 2014-06-21 DIAGNOSIS — M8580 Other specified disorders of bone density and structure, unspecified site: Secondary | ICD-10-CM

## 2014-06-21 MED ORDER — ATORVASTATIN CALCIUM 40 MG PO TABS: 40.0000 mg | ORAL_TABLET | Freq: Every day | ORAL | Status: DC

## 2014-06-21 MED ORDER — AMLODIPINE BESYLATE 10 MG PO TABS: 10.0000 mg | ORAL_TABLET | Freq: Every day | ORAL | Status: DC

## 2014-06-21 NOTE — Progress Notes (Signed)
Patient ID: Anita Perez, female   DOB: 1936-03-24, 79 y.o.   MRN: [id number]    Subjective:   Anita Perez is a 79 y.o. female who presents for an Initial Medicare Annual Wellness Visit and to review DEXA  AA female in NAD today.  No complaints today.  Current Medications (verified) Outpatient Encounter Prescriptions as of 06/21/2014  Medication Sig  . amLODipine (NORVASC) 10 MG tablet Take 1 tablet (10 mg total) by mouth daily.  Marland Kitchen aspirin 81 MG tablet Take 81 mg by mouth daily.  Marland Kitchen atorvastatin (LIPITOR) 40 MG tablet Take 1 tablet (40 mg total) by mouth daily.  . Calcium Carbonate-Vitamin D (CALCIUM + D PO) Take 600 mg by mouth daily.  . Cholecalciferol (VITAMIN D) 2000 UNITS CAPS Take 3,000 Units by mouth daily.  . fish oil-omega-3 fatty acids 1000 MG capsule Take 1 g by mouth daily.  . [DISCONTINUED] amLODipine (NORVASC) 10 MG tablet Take 1 tablet (10 mg total) by mouth daily.  . [DISCONTINUED] atorvastatin (LIPITOR) 40 MG tablet Take 1 tablet (40 mg total) by mouth daily. (Patient not taking: Reported on 06/21/2014)  . [DISCONTINUED] atorvastatin (LIPITOR) 40 MG tablet Take 40 mg by mouth daily.    Allergies (verified) Pepto-bismol   History: Past Medical History  Diagnosis Date  . Hypertension   . GERD (gastroesophageal reflux disease)   . Hiatal hernia   . Rectal carcinoid tumor   . Colon polyps   . Hyperlipidemia   . DDD (degenerative disc disease), lumbar   . Osteopenia    Past Surgical History  Procedure Laterality Date  . Breast surgery      cyst removed  . Knee surgery Right     arthroscopic  . Fatty tumor removed left side    . Intestinal  tear    . Appendectomy    . Cosmetic surgery Left     hand  . Colon surgery      polyp/ colostomy (removed now)  . Lumbar laminectomy/decompression microdiscectomy Left 06/14/2013    Procedure: LUMBAR LAMINECTOMY/DECOMPRESSION MICRODISCECTOMY LEFT LUMBAR FOUR-FIVE;  Surgeon: Faythe Ghee, MD;  Location: [facility];  Service: Neurosurgery;  Laterality: Left;   Family History  Problem Relation Age of Onset  . Kidney disease Mother   . Hypertension Mother   . Kidney disease Father   . Hypertension Father   . Cancer Sister   . Cancer Sister   . Early death Brother     AT 19 MONTHS OLD   Social History   Occupational History  . Not on file.   Social History Main Topics  . Smoking status: Former Smoker    Types: Cigarettes    Quit date: 06/25/1982  . Smokeless tobacco: Not on file  . Alcohol Use: Yes     Comment: socially - wine  . Drug Use: No  . Sexual Activity: Yes    Do you feel safe at home?  Yes  Dietary issues and exercise activities: Current Exercise Habits:: Home exercise routine, Type of exercise: walking, Time (Minutes): 20, Frequency (Times/Week): 3, Weekly Exercise (Minutes/Week): 60, Intensity: Moderate  Current Dietary habits:  Tries to limit salt intake and limit serving sizes to keep weight down   Objective:    Today's Vitals   06/21/14 1046  BP: 132/72  Pulse: 78  Height: 5\' 3"  (1.6 m)  Weight: 155 lb (70.308 kg)  PainSc: 0-No pain   Body mass index is 27.46 kg/(m^2).   DEXA Results Date  of Test T-Score for AP Spine L1-L4 T-Score for Total Left Hip T-Score for Total Right Hip  06/21/2014 -1.1 -0.9 -1.6  06/09/2012 -1.1 -0.5 -1.3  10/31/2009 -1.5 -0.4 -0.7  09/01/2007 -1.8 -0.3 -0.6   FRAX 10 year estimate: Total FX risk:  12%  (consider medication if >/= 20%) Hip FX risk:  2.4% (consider medication if >/= 3%)  Activities of Daily Living In your present state of health, do you have any difficulty performing the following activities: 06/21/2014  Is the patient deaf or have difficulty hearing? N  Hearing N  Vision N  Difficulty concentrating or making decisions N  Walking or climbing stairs? N  Doing errands, shopping? N  Preparing Food and eating ? N  Using the Toilet? N  In the past six months, have you accidently leaked urine? N  Do you have  problems with loss of bowel control? N  Managing your Medications? N  Managing your Finances? N  Housekeeping or managing your Housekeeping? N    Are there smokers in your home (other than you)? No   Cardiac Risk Factors include: advanced age (>45men, >54 women);hypertension  Depression Screen PHQ 2/9 Scores 06/21/2014 12/02/2013  PHQ - 2 Score 0 0    Fall Risk Fall Risk  06/21/2014 12/02/2013  Falls in the past year? No No    Cognitive Function: MMSE - Mini Mental State Exam 06/21/2014  Orientation to time 5  Orientation to Place 5  Registration 3  Attention/ Calculation 4  Recall 1  Language- name 2 objects 2  Language- repeat 1  Language- follow 3 step command 3  Language- read & follow direction 1  Write a sentence 1  Copy design 1  Total score 27    Immunizations and Health Maintenance Immunization History  Administered Date(s) Administered  . Influenza,inj,Quad PF,36+ Mos 04/01/2013, 02/09/2014   Health Maintenance Due  Topic Date Due  . TETANUS/TDAP  07/03/1956  . ZOSTAVAX  07/03/1997  . PNA vac Low Risk Adult (1 of 2 - PCV13) 07/04/2002    Patient Care Team: Chevis Pretty, FNP as PCP - General (Nurse Practitioner) Rogene Houston, MD as Consulting Physician (Gastroenterology) Karie Chimera, MD as Consulting Physician (Neurosurgery)  Indicate any recent Medical Services you may have received from other than [facility] providers in the past year (date may be approximate).    Assessment:    Annual Wellness Visit  Osteopenia   Screening Tests Health Maintenance  Topic Date Due  . TETANUS/TDAP  07/03/1956  . ZOSTAVAX  07/03/1997  . PNA vac Low Risk Adult (1 of 2 - PCV13) 07/04/2002  . INFLUENZA VACCINE  10/30/2014  . MAMMOGRAM  01/24/2015  . DEXA SCAN  06/20/2016  . COLONOSCOPY  12/02/2021        Plan:   During the course of the visit Anita Perez was educated and counseled about the following appropriate screening and preventive services:    Vaccines to include Pneumoccal, Influenza, Hepatitis B, Td, Zostavax - declined Zostavax and Tdap due to cost but patient is going to recheck with her insurance.  Prevnar 13 given in office today  Colorectal cancer screening - FOBT given in office; colonoscopy UTD  Cardiovascular disease screening - Patient to see physcian next week for lipid recheck and assessment  Diabetes screening - UTD  Bone Denisty / Osteoporosis Screening - done today  Mammogram - UTD  Glaucoma screening / Eye Exam - Recommended  Nutrition counseling - discussed increasing calcium in diet and continue to  use calcium supplement  Advanced Directives - packet given in office today  Fall prevention discussed  Continue weight bearing exercise - recommended increase frequency or length of time exercising as able.    Dietary issues and exercise activities discussed: Current Exercise Habits:: Home exercise routine, Type of exercise: walking, Time (Minutes): 20, Frequency (Times/Week): 3, Weekly Exercise (Minutes/Week): 60, Intensity: Moderate  Goals    . Exercise 150 minutes per week (moderate activity)        Patient Instructions (the written plan) were given to the patient.   Cherre Robins, [facility]   06/21/2014

## 2014-06-21 NOTE — Patient Instructions (Addendum)
Recommend Eye Exam  Health Maintenance Summary     Anita Perez 07/03/1954 Checked [facility] - $31.90    ZOSTAVAX Overdue 07/03/1997 Checked [place] - $115    DEXA SCAN Done today      PNA vac Low Risk Adult Done today      INFLUENZA VACCINE Next Due 10/30/2014     MAMMOGRAM Next Due 01/24/2015     COLONOSCOPY Next Due 12/02/2021        Preventive Care for Adults A healthy lifestyle and preventive care can promote health and wellness. Preventive health guidelines for women include the following key practices.  A routine yearly physical is a good way to check with your health care provider about your health and preventive screening. It is a chance to share any concerns and updates on your health and to receive a thorough exam.  Visit your dentist for a routine exam and preventive care every 6 months. Brush your teeth twice a day and floss once a day. Good oral hygiene prevents tooth decay and gum disease.  The frequency of eye exams is based on your age, health, family medical history, use of contact lenses, and other factors. Follow your health care provider's recommendations for frequency of eye exams.  Eat a healthy diet. Foods like vegetables, fruits, whole grains, low-fat dairy products, and lean protein foods contain the nutrients you need without too many calories. Decrease your intake of foods high in solid fats, added sugars, and salt. Eat the right amount of calories for you.Get information about a proper diet from your health care provider, if necessary.  Regular physical exercise is one of the most important things you can do for your health. Most adults should get at least 150 minutes of moderate-intensity exercise (any activity that increases your heart rate and causes you to sweat) each week. In addition, most adults need muscle-strengthening exercises on 2 or more days a week.  Maintain a healthy weight. The body mass index (BMI) is a screening tool to identify possible weight  problems. It provides an estimate of body fat based on height and weight. Your health care provider can find your BMI and can help you achieve or maintain a healthy weight.For adults 20 years and older:  A BMI below 18.5 is considered underweight.  A BMI of 18.5 to 24.9 is normal.  A BMI of 25 to 29.9 is considered overweight.  A BMI of 30 and above is considered obese.  Maintain normal blood lipids and cholesterol levels by exercising and minimizing your intake of saturated fat. Eat a balanced diet with plenty of fruit and vegetables. Blood tests for lipids and cholesterol should begin at age 29 and be repeated every 5 years. If your lipid or cholesterol levels are high, you are over 50, or you are at high risk for heart disease, you may need your cholesterol levels checked more frequently.Ongoing high lipid and cholesterol levels should be treated with medicines if diet and exercise are not working.  If you smoke, find out from your health care provider how to quit. If you do not use tobacco, do not start.  Lung cancer screening is recommended for adults aged 30-80 years who are at high risk for developing lung cancer because of a history of smoking. A yearly low-dose CT scan of the lungs is recommended for people who have at least a 30-pack-year history of smoking and are a current smoker or have quit within the past 15 years. A pack year of smoking  is smoking an average of 1 pack of cigarettes a day for 1 year (for example: 1 pack a day for 30 years or 2 packs a day for 15 years). Yearly screening should continue until the smoker has stopped smoking for at least 15 years. Yearly screening should be stopped for people who develop a health problem that would prevent them from having lung cancer treatment.  If you are pregnant, do not drink alcohol. If you are breastfeeding, be very cautious about drinking alcohol. If you are not pregnant and choose to drink alcohol, do not have more than 1 drink  per day. One drink is considered to be 12 ounces (355 mL) of beer, 5 ounces (148 mL) of wine, or 1.5 ounces (44 mL) of liquor.  Avoid use of street drugs. Do not share needles with anyone. Ask for help if you need support or instructions about stopping the use of drugs.  High blood pressure causes heart disease and increases the risk of stroke. Your blood pressure should be checked at least every 1 to 2 years. Ongoing high blood pressure should be treated with medicines if weight loss and exercise do not work.  If you are 62-65 years old, ask your health care provider if you should take aspirin to prevent strokes.  Diabetes screening involves taking a blood sample to check your fasting blood sugar level. This should be done once every 3 years, after age 36, if you are within normal weight and without risk factors for diabetes. Testing should be considered at a younger age or be carried out more frequently if you are overweight and have at least 1 risk factor for diabetes.  Breast cancer screening is essential preventive care for women. You should practice "breast self-awareness." This means understanding the normal appearance and feel of your breasts and may include breast self-examination. Any changes detected, no matter how small, should be reported to a health care provider. Women in their 63s and 30s should have a clinical breast exam (CBE) by a health care provider as part of a regular health exam every 1 to 3 years. After age 54, women should have a CBE every year. Starting at age 45, women should consider having a mammogram (breast X-ray test) every year. Women who have a family history of breast cancer should talk to their health care provider about genetic screening. Women at a high risk of breast cancer should talk to their health care providers about having an MRI and a mammogram every year.  Breast cancer gene (BRCA)-related cancer risk assessment is recommended for women who have family  members with BRCA-related cancers. BRCA-related cancers include breast, ovarian, tubal, and peritoneal cancers. Having family members with these cancers may be associated with an increased risk for harmful changes (mutations) in the breast cancer genes BRCA1 and BRCA2. Results of the assessment will determine the need for genetic counseling and BRCA1 and BRCA2 testing.  Routine pelvic exams to screen for cancer are no longer recommended for nonpregnant women who are considered low risk for cancer of the pelvic organs (ovaries, uterus, and vagina) and who do not have symptoms. Ask your health care provider if a screening pelvic exam is right for you.  If you have had past treatment for cervical cancer or a condition that could lead to cancer, you need Pap tests and screening for cancer for at least 20 years after your treatment. If Pap tests have been discontinued, your risk factors (such as having a new sexual partner)  need to be reassessed to determine if screening should be resumed. Some women have medical problems that increase the chance of getting cervical cancer. In these cases, your health care provider may recommend more frequent screening and Pap tests.  The HPV test is an additional test that may be used for cervical cancer screening. The HPV test looks for the virus that can cause the cell changes on the cervix. The cells collected during the Pap test can be tested for HPV. The HPV test could be used to screen women aged 50 years and older, and should be used in women of any age who have unclear Pap test results. After the age of 76, women should have HPV testing at the same frequency as a Pap test.  Colorectal cancer can be detected and often prevented. Most routine colorectal cancer screening begins at the age of 60 years and continues through age 67 years. However, your health care provider may recommend screening at an earlier age if you have risk factors for colon cancer. On a yearly basis,  your health care provider may provide home test kits to check for hidden blood in the stool. Use of a small camera at the end of a tube, to directly examine the colon (sigmoidoscopy or colonoscopy), can detect the earliest forms of colorectal cancer. Talk to your health care provider about this at age 11, when routine screening begins. Direct exam of the colon should be repeated every 5-10 years through age 61 years, unless early forms of pre-cancerous polyps or small growths are found.  People who are at an increased risk for hepatitis B should be screened for this virus. You are considered at high risk for hepatitis B if:  You were born in a country where hepatitis B occurs often. Talk with your health care provider about which countries are considered high risk.  Your parents were born in a high-risk country and you have not received a shot to protect against hepatitis B (hepatitis B vaccine).  You have HIV or AIDS.  You use needles to inject street drugs.  You live with, or have sex with, someone who has hepatitis B.  You get hemodialysis treatment.  You take certain medicines for conditions like cancer, organ transplantation, and autoimmune conditions.  Hepatitis C blood testing is recommended for all people born from [birth year] through 1965 and any individual with known risks for hepatitis C.  Practice safe sex. Use condoms and avoid high-risk sexual practices to reduce the spread of sexually transmitted infections (STIs). STIs include gonorrhea, chlamydia, syphilis, trichomonas, herpes, HPV, and human immunodeficiency virus (HIV). Herpes, HIV, and HPV are viral illnesses that have no cure. They can result in disability, cancer, and death.  You should be screened for sexually transmitted illnesses (STIs) including gonorrhea and chlamydia if:  You are sexually active and are younger than 24 years.  You are older than 24 years and your health care provider tells you that you are at risk for  this type of infection.  Your sexual activity has changed since you were last screened and you are at an increased risk for chlamydia or gonorrhea. Ask your health care provider if you are at risk.  If you are at risk of being infected with HIV, it is recommended that you take a prescription medicine daily to prevent HIV infection. This is called preexposure prophylaxis (PrEP). You are considered at risk if:  You are a heterosexual woman, are sexually active, and are at increased risk for  HIV infection.  You take drugs by injection.  You are sexually active with a partner who has HIV.  Talk with your health care provider about whether you are at high risk of being infected with HIV. If you choose to begin PrEP, you should first be tested for HIV. You should then be tested every 3 months for as long as you are taking PrEP.  Osteoporosis is a disease in which the bones lose minerals and strength with aging. This can result in serious bone fractures or breaks. The risk of osteoporosis can be identified using a bone density scan. Women ages 30 years and over and women at risk for fractures or osteoporosis should discuss screening with their health care providers. Ask your health care provider whether you should take a calcium supplement or vitamin D to reduce the rate of osteoporosis.  Menopause can be associated with physical symptoms and risks. Hormone replacement therapy is available to decrease symptoms and risks. You should talk to your health care provider about whether hormone replacement therapy is right for you.  Use sunscreen. Apply sunscreen liberally and repeatedly throughout the day. You should seek shade when your shadow is shorter than you. Protect yourself by wearing long sleeves, pants, a wide-brimmed hat, and sunglasses year round, whenever you are outdoors.  Once a month, do a whole body skin exam, using a mirror to look at the skin on your back. Tell your health care provider of  new moles, moles that have irregular borders, moles that are larger than a pencil eraser, or moles that have changed in shape or color.  Stay current with required vaccines (immunizations).  Influenza vaccine. All adults should be immunized every year.  Tetanus, diphtheria, and acellular pertussis (Td, Tdap) vaccine. Pregnant women should receive 1 dose of Tdap vaccine during each pregnancy. The dose should be obtained regardless of the length of time since the last dose. Immunization is preferred during the 27th-36th week of gestation. An adult who has not previously received Tdap or who does not know her vaccine status should receive 1 dose of Tdap. This initial dose should be followed by tetanus and diphtheria toxoids (Td) booster doses every 10 years. Adults with an unknown or incomplete history of completing a 3-dose immunization series with Td-containing vaccines should begin or complete a primary immunization series including a Tdap dose. Adults should receive a Td booster every 10 years.  Varicella vaccine. An adult without evidence of immunity to varicella should receive 2 doses or a second dose if she has previously received 1 dose. Pregnant females who do not have evidence of immunity should receive the first dose after pregnancy. This first dose should be obtained before leaving the health care facility. The second dose should be obtained 4-8 weeks after the first dose.  Human papillomavirus (HPV) vaccine. Females aged 13-26 years who have not received the vaccine previously should obtain the 3-dose series. The vaccine is not recommended for use in pregnant females. However, pregnancy testing is not needed before receiving a dose. If a female is found to be pregnant after receiving a dose, no treatment is needed. In that case, the remaining doses should be delayed until after the pregnancy. Immunization is recommended for any person with an immunocompromised condition through the age of 3  years if she did not get any or all doses earlier. During the 3-dose series, the second dose should be obtained 4-8 weeks after the first dose. The third dose should be obtained 24 weeks  after the first dose and 16 weeks after the second dose.  Zoster vaccine. One dose is recommended for adults aged 61 years or older unless certain conditions are present.  Measles, mumps, and rubella (MMR) vaccine. Adults born before [birth year] generally are considered immune to measles and mumps. Adults born in [birth year] or later should have 1 or more doses of MMR vaccine unless there is a contraindication to the vaccine or there is laboratory evidence of immunity to each of the three diseases. A routine second dose of MMR vaccine should be obtained at least 28 days after the first dose for students attending postsecondary schools, health care workers, or international travelers. People who received inactivated measles vaccine or an unknown type of measles vaccine during 1963-1967 should receive 2 doses of MMR vaccine. People who received inactivated mumps vaccine or an unknown type of mumps vaccine before 1979 and are at high risk for mumps infection should consider immunization with 2 doses of MMR vaccine. For females of childbearing age, rubella immunity should be determined. If there is no evidence of immunity, females who are not pregnant should be vaccinated. If there is no evidence of immunity, females who are pregnant should delay immunization until after pregnancy. Unvaccinated health care workers born before [birth year] who lack laboratory evidence of measles, mumps, or rubella immunity or laboratory confirmation of disease should consider measles and mumps immunization with 2 doses of MMR vaccine or rubella immunization with 1 dose of MMR vaccine.  Pneumococcal 13-valent conjugate (PCV13) vaccine. When indicated, a person who is uncertain of her immunization history and has no record of immunization should receive the PCV13 vaccine.  An adult aged 57 years or older who has certain medical conditions and has not been previously immunized should receive 1 dose of PCV13 vaccine. This PCV13 should be followed with a dose of pneumococcal polysaccharide (PPSV23) vaccine. The PPSV23 vaccine dose should be obtained at least 8 weeks after the dose of PCV13 vaccine. An adult aged 48 years or older who has certain medical conditions and previously received 1 or more doses of PPSV23 vaccine should receive 1 dose of PCV13. The PCV13 vaccine dose should be obtained 1 or more years after the last PPSV23 vaccine dose.  Pneumococcal polysaccharide (PPSV23) vaccine. When PCV13 is also indicated, PCV13 should be obtained first. All adults aged 7 years and older should be immunized. An adult younger than age 85 years who has certain medical conditions should be immunized. Any person who resides in a nursing home or long-term care facility should be immunized. An adult smoker should be immunized. People with an immunocompromised condition and certain other conditions should receive both PCV13 and PPSV23 vaccines. People with human immunodeficiency virus (HIV) infection should be immunized as soon as possible after diagnosis. Immunization during chemotherapy or radiation therapy should be avoided. Routine use of PPSV23 vaccine is not recommended for American Indians, [place] Natives, or people younger than 65 years unless there are medical conditions that require PPSV23 vaccine. When indicated, people who have unknown immunization and have no record of immunization should receive PPSV23 vaccine. One-time revaccination 5 years after the first dose of PPSV23 is recommended for people aged 19-64 years who have chronic kidney failure, nephrotic syndrome, asplenia, or immunocompromised conditions. People who received 1-2 doses of PPSV23 before age 2 years should receive another dose of PPSV23 vaccine at age 37 years or later if at least 5 years have passed since the  previous dose. Doses of PPSV23 are not needed for  people immunized with PPSV23 at or after age 6 years.  Meningococcal vaccine. Adults with asplenia or persistent complement component deficiencies should receive 2 doses of quadrivalent meningococcal conjugate (MenACWY-D) vaccine. The doses should be obtained at least 2 months apart. Microbiologists working with certain meningococcal bacteria, [employer], people at risk during an outbreak, and people who travel to or live in countries with a high rate of meningitis should be immunized. A first-year college student up through age 74 years who is living in a residence hall should receive a dose if she did not receive a dose on or after her 16th birthday. Adults who have certain high-risk conditions should receive one or more doses of vaccine.  Hepatitis A vaccine. Adults who wish to be protected from this disease, have certain high-risk conditions, work with hepatitis A-infected animals, work in hepatitis A research labs, or travel to or work in countries with a high rate of hepatitis A should be immunized. Adults who were previously unvaccinated and who anticipate close contact with an international adoptee during the first 60 days after arrival in the [place] from a country with a high rate of hepatitis A should be immunized.  Hepatitis B vaccine. Adults who wish to be protected from this disease, have certain high-risk conditions, may be exposed to blood or other infectious body fluids, are household contacts or sex partners of hepatitis B positive people, are clients or workers in certain care facilities, or travel to or work in countries with a high rate of hepatitis B should be immunized.  Haemophilus influenzae type b (Hib) vaccine. A previously unvaccinated person with asplenia or sickle cell disease or having a scheduled splenectomy should receive 1 dose of Hib vaccine. Regardless of previous immunization, a recipient of a hematopoietic  stem cell transplant should receive a 3-dose series 6-12 months after her successful transplant. Hib vaccine is not recommended for adults with HIV infection. Preventive Services / Frequency Ages 66 years and over  Blood pressure check.** / Every 1 to 2 years.  Lipid and cholesterol check.** / Every 5 years beginning at age 3 years.  Lung cancer screening. / Every year if you are aged 53-80 years and have a 30-pack-year history of smoking and currently smoke or have quit within the past 15 years. Yearly screening is stopped once you have quit smoking for at least 15 years or develop a health problem that would prevent you from having lung cancer treatment.  Clinical breast exam.** / Every year after age 79 years.  BRCA-related cancer risk assessment.** / For women who have family members with a BRCA-related cancer (breast, ovarian, tubal, or peritoneal cancers).  Mammogram.** / Every year beginning at age 63 years and continuing for as long as you are in good health. Consult with your health care provider.  Pap test.** / Every 3 years starting at age 22 years through age 48 or 4 years with 3 consecutive normal Pap tests. Testing can be stopped between 65 and 70 years with 3 consecutive normal Pap tests and no abnormal Pap or HPV tests in the past 10 years.  HPV screening.** / Every 3 years from ages 70 years through ages 21 or 49 years with a history of 3 consecutive normal Pap tests. Testing can be stopped between 65 and 70 years with 3 consecutive normal Pap tests and no abnormal Pap or HPV tests in the past 10 years.  Fecal occult blood test (FOBT) of stool. / Every year beginning at age 70  years and continuing until age 49 years. You may not need to do this test if you get a colonoscopy every 10 years.  Flexible sigmoidoscopy or colonoscopy.** / Every 5 years for a flexible sigmoidoscopy or every 10 years for a colonoscopy beginning at age 47 years and continuing until age 23  years.  Hepatitis C blood test.** / For all people born from [birth year] through 1963 and any individual with known risks for hepatitis C.  Osteoporosis screening.** / A one-time screening for women ages 18 years and over and women at risk for fractures or osteoporosis.  Skin self-exam. / Monthly.  Influenza vaccine. / Every year.  Tetanus, diphtheria, and acellular pertussis (Tdap/Td) vaccine.** / 1 dose of Td every 10 years.  Varicella vaccine.** / Consult your health care provider.  Zoster vaccine.** / 1 dose for adults aged 36 years or older.  Pneumococcal 13-valent conjugate (PCV13) vaccine.** / Consult your health care provider.  Pneumococcal polysaccharide (PPSV23) vaccine.** / 1 dose for all adults aged 66 years and older.  Meningococcal vaccine.** / Consult your health care provider.  Hepatitis A vaccine.** / Consult your health care provider.  Hepatitis B vaccine.** / Consult your health care provider.  Haemophilus influenzae type b (Hib) vaccine.** / Consult your health care provider. ** Family history and personal history of risk and conditions may change your health care provider's recommendations. Document Released: 05/13/2001 Document Revised: 08/01/2013 Document Reviewed: 08/12/2010 [facility] Patient Information 2015 [place], [place]. This information is not intended to replace advice given to you by your health care provider. Make sure you discuss any questions you have with your health care provider.               Exercise for Strong Bones  Exercise is important to build and maintain strong bones / bone density.  There are 2 types of exercises that are important to building and maintaining strong bones:  Weight- bearing and muscle-stregthening.  Weight-bearing Exercises  These exercises include activities that make you move against gravity while staying upright. Weight-bearing exercises can be high-impact or low-impact.  High-impact weight-bearing exercises help build  bones and keep them strong. If you have broken a bone due to osteoporosis or are at risk of breaking a bone, you may need to avoid high-impact exercises. If you're not sure, you should check with your healthcare provider.  Examples of high-impact weight-bearing exercises are: Dancing  Doing high-impact aerobics  Hiking  Jogging/running  Jumping Rope  Stair climbing  Tennis  Low-impact weight-bearing exercises can also help keep bones strong and are a safe alternative if you cannot do high-impact exercises.   Examples of low-impact weight-bearing exercises are: Using elliptical training machines  Doing low-impact aerobics  Using stair-step machines  Fast walking on a treadmill or outside   Muscle-Strengthening Exercises These exercises include activities where you move your body, a weight or some other resistance against gravity. They are also known as resistance exercises and include: Lifting weights  Using elastic exercise bands  Using weight machines  Lifting your own body weight  Functional movements, such as standing and rising up on your toes  Yoga and Pilates can also improve strength, balance and flexibility. However, certain positions may not be safe for people with osteoporosis or those at increased risk of broken bones. For example, exercises that have you bend forward may increase the chance of breaking a bone in the spine.   Non-Impact Exercises There are other types of exercises that can help prevent falls.  Non-impact exercises can help you to improve balance, posture and how well you move in everyday activities. Some of these exercises include: Balance exercises that strengthen your legs and test your balance, such as Tai Chi, can decrease your risk of falls.  Posture exercises that improve your posture and reduce rounded or "sloping" shoulders can help you decrease the chance of breaking a bone, especially in the spine.  Functional exercises that improve how well you  move can help you with everyday activities and decrease your chance of falling and breaking a bone. For example, if you have trouble getting up from a chair or climbing stairs, you should do these activities as exercises.   **A physical therapist can teach you balance, posture and functional exercises. He/she can also help you learn which exercises are safe and appropriate for you.  [facility] has a physical therapy office in [place] in front of our office and referrals can be made for assessments and treatment as needed and strength and balance training.  If you would like to have an assessment with [facility] and our physical therapy team please let a nurse or provider know.   Fall Prevention and Home Safety Falls cause injuries and can affect all age groups. It is possible to use preventive measures to significantly decrease the likelihood of falls. There are many simple measures which can make your home safer and prevent falls. OUTDOORS  Repair cracks and edges of walkways and driveways.  Remove high doorway thresholds.  Trim shrubbery on the main path into your home.  Have good outside lighting.  Clear walkways of tools, rocks, debris, and clutter.  Check that handrails are not broken and are securely fastened. Both sides of steps should have handrails.  Have leaves, snow, and ice cleared regularly.  Use sand or salt on walkways during winter months.  In the garage, clean up grease or oil spills. BATHROOM  Install night lights.  Install grab bars by the toilet and in the tub and shower.  Use non-skid mats or decals in the tub or shower.  Place a plastic non-slip stool in the shower to sit on, if needed.  Keep floors dry and clean up all water on the floor immediately.  Remove soap buildup in the tub or shower on a regular basis.  Secure bath mats with non-slip, double-sided rug tape.  Remove throw rugs and tripping hazards from the floors. BEDROOMS  Install night  lights.  Make sure a bedside light is easy to reach.  Do not use oversized bedding.  Keep a telephone by your bedside.  Have a firm chair with side arms to use for getting dressed.  Remove throw rugs and tripping hazards from the floor. KITCHEN  Keep handles on pots and pans turned toward the center of the stove. Use back burners when possible.  Clean up spills quickly and allow time for drying.  Avoid walking on wet floors.  Avoid hot utensils and knives.  Position shelves so they are not too high or low.  Place commonly used objects within easy reach.  If necessary, use a sturdy step stool with a grab bar when reaching.  Keep electrical cables out of the way.  Do not use floor polish or wax that makes floors slippery. If you must use wax, use non-skid floor wax.  Remove throw rugs and tripping hazards from the floor. STAIRWAYS  Never leave objects on stairs.  Place handrails on both sides of stairways and use them. Fix any  loose handrails. Make sure handrails on both sides of the stairways are as long as the stairs.  Check carpeting to make sure it is firmly attached along stairs. Make repairs to worn or loose carpet promptly.  Avoid placing throw rugs at the top or bottom of stairways, or properly secure the rug with carpet tape to prevent slippage. Get rid of throw rugs, if possible.  Have an electrician put in a light switch at the top and bottom of the stairs. OTHER FALL PREVENTION TIPS  Wear low-heel or rubber-soled shoes that are supportive and fit well. Wear closed toe shoes.  When using a stepladder, make sure it is fully opened and both spreaders are firmly locked. Do not climb a closed stepladder.  Add color or contrast paint or tape to grab bars and handrails in your home. Place contrasting color strips on first and last steps.  Learn and use mobility aids as needed. Install an electrical emergency response system.  Turn on lights to avoid dark areas.  Replace light bulbs that burn out immediately. Get light switches that glow.  Arrange furniture to create clear pathways. Keep furniture in the same place.  Firmly attach carpet with non-skid or double-sided tape.  Eliminate uneven floor surfaces.  Select a carpet pattern that does not visually hide the edge of steps.  Be aware of all pets. OTHER HOME SAFETY TIPS  Set the water temperature for 120 F (48.8 C).  Keep emergency numbers on or near the telephone.  Keep smoke detectors on every level of the home and near sleeping areas. Document Released: 03/07/2002 Document Revised: 09/16/2011 Document Reviewed: 06/06/2011 [facility] Patient Information 2015 [place], [place]. This information is not intended to replace advice given to you by your health care provider. Make sure you discuss any questions you have with your health care provider.

## 2014-06-29 ENCOUNTER — Ambulatory Visit (INDEPENDENT_AMBULATORY_CARE_PROVIDER_SITE_OTHER): Payer: Medicare Other | Admitting: Family Medicine

## 2014-06-29 ENCOUNTER — Encounter: Payer: Self-pay | Admitting: Family Medicine

## 2014-06-29 VITALS — BP 164/63 | HR 74 | Temp 98.6°F | Ht 63.0 in | Wt 154.6 lb

## 2014-06-29 DIAGNOSIS — M25561 Pain in right knee: Secondary | ICD-10-CM

## 2014-06-29 DIAGNOSIS — E559 Vitamin D deficiency, unspecified: Secondary | ICD-10-CM | POA: Diagnosis not present

## 2014-06-29 DIAGNOSIS — E785 Hyperlipidemia, unspecified: Secondary | ICD-10-CM | POA: Diagnosis not present

## 2014-06-29 DIAGNOSIS — I10 Essential (primary) hypertension: Secondary | ICD-10-CM

## 2014-06-29 DIAGNOSIS — M8580 Other specified disorders of bone density and structure, unspecified site: Secondary | ICD-10-CM

## 2014-06-29 DIAGNOSIS — M899 Disorder of bone, unspecified: Secondary | ICD-10-CM | POA: Diagnosis not present

## 2014-06-29 LAB — POCT UA - MICROSCOPIC ONLY
Bacteria, U Microscopic: NEGATIVE
Casts, Ur, LPF, POC: NEGATIVE
Crystals, Ur, HPF, POC: NEGATIVE
Mucus, UA: NEGATIVE
Yeast, UA: NEGATIVE

## 2014-06-29 LAB — POCT URINALYSIS DIPSTICK
Bilirubin, UA: NEGATIVE
Blood, UA: NEGATIVE
GLUCOSE UA: NEGATIVE
Ketones, UA: NEGATIVE
LEUKOCYTES UA: NEGATIVE
NITRITE UA: NEGATIVE
PROTEIN UA: NEGATIVE
Spec Grav, UA: 1.02
UROBILINOGEN UA: NEGATIVE
pH, UA: 5

## 2014-06-29 LAB — POCT CBC
GRANULOCYTE PERCENT: 64.9 % (ref 37–80)
HCT, POC: 39.4 % (ref 37.7–47.9)
Hemoglobin: 11.9 g/dL — AB (ref 12.2–16.2)
Lymph, poc: 1.8 (ref 0.6–3.4)
MCH: 25.2 pg — AB (ref 27–31.2)
MCHC: 30.1 g/dL — AB (ref 31.8–35.4)
MCV: 83.7 fL (ref 80–97)
MPV: 7.2 fL (ref 0–99.8)
POC GRANULOCYTE: 4.2 (ref 2–6.9)
POC LYMPH PERCENT: 28.8 %L (ref 10–50)
Platelet Count, POC: 292 10*3/uL (ref 142–424)
RBC: 4.7 M/uL (ref 4.04–5.48)
RDW, POC: 14.9 %
WBC: 6.4 10*3/uL (ref 4.6–10.2)

## 2014-06-29 MED ORDER — MELOXICAM 15 MG PO TABS: 15.0000 mg | ORAL_TABLET | Freq: Every day | ORAL | Status: DC

## 2014-06-29 MED ORDER — RALOXIFENE HCL 60 MG PO TABS: 60.0000 mg | ORAL_TABLET | Freq: Every day | ORAL | Status: DC

## 2014-06-29 NOTE — Progress Notes (Signed)
Subjective:  Patient ID: Anita Perez, female    DOB: 1935-07-23  Age: 79 y.o. MRN: [id number]  CC: Hypertension; Hyperlipidemia; and Knee Pain   HPI Anita Perez presents for follow-up of hypertension. Patient has no history of headache chest pain or shortness of breath or recent cough. Patient also denies symptoms of TIA such as numbness weakness lateralizing. Patient checks  blood pressure at home and has not had any elevated readings recently. Patient denies side effects from his medication. States taking it regularly.   Patient in for follow-up of elevated cholesterol. Doing well without complaints on current medication. Denies side effects of statin including myalgia and arthralgia and nausea. Also in today for liver function testing. Currently no chest pain, shortness of breath or other cardiovascular related symptoms noted.  Pain and stiffness in the morning. Resolves with activity. Increases when she rests and tries to become active again after extended period of being immobile. She denies this being related to her use of atorvastatin. She does not use anything for arthritis.  [name] has a past medical history of Hypertension; GERD (gastroesophageal reflux disease); Hiatal hernia; Rectal carcinoid tumor; Colon polyps; Hyperlipidemia; DDD (degenerative disc disease), lumbar; and Osteopenia.   She has past surgical history that includes Breast surgery; Knee surgery (Right); fatty tumor removed left side; intestinal  tear; Appendectomy; Cosmetic surgery (Left); Colon surgery; and Lumbar laminectomy/decompression microdiscectomy (Left, 06/14/2013).   Her family history includes Cancer in her sister and sister; Early death in her brother; Hypertension in her father and mother; Kidney disease in her father and mother.She reports that she quit smoking about 32 years ago. Her smoking use included Cigarettes. She does not have any smokeless tobacco history on file. She reports that  she drinks alcohol. She reports that she does not use illicit drugs.  Current Outpatient Prescriptions on File Prior to Visit  Medication Sig Dispense Refill  . amLODipine (NORVASC) 10 MG tablet Take 1 tablet (10 mg total) by mouth daily. 90 tablet 0  . aspirin 81 MG tablet Take 81 mg by mouth daily.    Marland Kitchen atorvastatin (LIPITOR) 40 MG tablet Take 1 tablet (40 mg total) by mouth daily. 90 tablet 0  . Calcium Carbonate-Vitamin D (CALCIUM + D PO) Take 600 mg by mouth daily.    . Cholecalciferol (VITAMIN D) 2000 UNITS CAPS Take 3,000 Units by mouth daily.    . fish oil-omega-3 fatty acids 1000 MG capsule Take 1 g by mouth daily.     No current facility-administered medications on file prior to visit.    ROS Review of Systems  Constitutional: Negative for fever, chills, diaphoresis, appetite change, fatigue and unexpected weight change.  HENT: Negative for congestion, ear pain, hearing loss, postnasal drip, rhinorrhea, sneezing, sore throat and trouble swallowing.   Eyes: Negative for pain.  Respiratory: Negative for cough, chest tightness and shortness of breath.   Cardiovascular: Negative for chest pain and palpitations.  Gastrointestinal: Negative for nausea, vomiting, abdominal pain, diarrhea and constipation.  Genitourinary: Negative for dysuria, frequency and menstrual problem.  Musculoskeletal: Positive for arthralgias. Negative for joint swelling.  Skin: Negative for rash.  Neurological: Negative for dizziness, weakness, numbness and headaches.  Psychiatric/Behavioral: Negative for dysphoric mood and agitation.    Objective:  BP 164/63 mmHg  Pulse 74  Temp(Src) 98.6 F (37 C) (Oral)  Ht _0  (1.6 m)  Wt 154 lb 9.6 oz (70.126 kg)  BMI 27.39 kg/m2  BP Readings from Last 3 Encounters:  06/29/14 164/63  06/21/14 132/72  12/02/13 160/90    Wt Readings from Last 3 Encounters:  06/29/14 154 lb 9.6 oz (70.126 kg)  06/21/14 155 lb (70.308 kg)  12/02/13 155 lb (70.308 kg)      Physical Exam  Constitutional: She is oriented to person, place, and time. She appears well-developed and well-nourished. No distress.  HENT:  Head: Normocephalic and atraumatic.  Right Ear: External ear normal.  Left Ear: External ear normal.  Nose: Nose normal.  Mouth/Throat: Oropharynx is clear and moist.  Eyes: Conjunctivae and EOM are normal. Pupils are equal, round, and reactive to light.  Neck: Normal range of motion. Neck supple. No thyromegaly present.  Cardiovascular: Normal rate, regular rhythm and normal heart sounds.   No murmur heard. Pulmonary/Chest: Effort normal and breath sounds normal. No respiratory distress. She has no wheezes. She has no rales.  Abdominal: Soft. Bowel sounds are normal. She exhibits no distension. There is no tenderness.  Lymphadenopathy:    She has no cervical adenopathy.  Neurological: She is alert and oriented to person, place, and time. She has normal reflexes.  Skin: Skin is warm and dry.  Psychiatric: She has a normal mood and affect. Her behavior is normal. Judgment and thought content normal.    No results found for: HGBA1C  Lab Results  Component Value Date   WBC 6.4 06/29/2014   HGB 11.9* 06/29/2014   HCT 39.4 06/29/2014   PLT 353 06/06/2013   GLUCOSE 87 12/02/2013   CHOL 199 12/02/2013   TRIG 113 12/02/2013   HDL 51 12/02/2013   LDLCALC 125* 12/02/2013   ALT 16 12/02/2013   AST 19 12/02/2013   NA 141 12/02/2013   K 3.9 12/02/2013   CL 101 12/02/2013   CREATININE 0.99 12/02/2013   BUN 18 12/02/2013   CO2 23 12/02/2013    Dg Chest 2 View  06/06/2013   CLINICAL DATA:  Preop for back surgery.  Hypertension.  EXAM: CHEST  2 VIEW  COMPARISON:  None.  FINDINGS: Cardiac silhouette is normal in size and configuration. Normal mediastinal and hilar contours.  Clear lungs.  No pleural effusion or pneumothorax.  Bony thorax is demineralized but intact.  IMPRESSION: No active cardiopulmonary disease.   Electronically Signed   By:  Lajean Manes M.D.   On: 06/06/2013 15:49    Assessment & Plan:   Anita Perez was seen today for hypertension, hyperlipidemia and knee pain.  Diagnoses and all orders for this visit:  Essential hypertension, benign Orders: -     POCT CBC -     CMP14+EGFR -     POCT urinalysis dipstick -     POCT UA - Microscopic Only  Right knee pain Orders: -     POCT CBC -     meloxicam (MOBIC) 15 MG tablet; Take 1 tablet (15 mg total) by mouth daily.  Hyperlipidemia with target LDL less than 100 Orders: -     POCT CBC -     CMP14+EGFR -     NMR, lipoprofile  Osteopenia determined by x-ray Orders: -     POCT CBC -     Vit D  25 hydroxy (rtn osteoporosis monitoring) -     raloxifene (EVISTA) 60 MG tablet; Take 1 tablet (60 mg total) by mouth daily. For bone health and breast cancer prevention  Vitamin D deficiency Orders: -     Vit D  25 hydroxy (rtn osteoporosis monitoring)  I am having Anita Perez start on  raloxifene and meloxicam. I am also having her maintain her aspirin, Vitamin D, fish oil-omega-3 fatty acids, Calcium Carbonate-Vitamin D (CALCIUM + D PO), amLODipine, and atorvastatin.  Meds ordered this encounter  Medications  . raloxifene (EVISTA) 60 MG tablet    Sig: Take 1 tablet (60 mg total) by mouth daily. For bone health and breast cancer prevention    Dispense:  30 tablet    Refill:  11  . meloxicam (MOBIC) 15 MG tablet    Sig: Take 1 tablet (15 mg total) by mouth daily.    Dispense:  30 tablet    Refill:  5     Follow-up: No Follow-up on file.  Claretta Fraise, M.D.

## 2014-06-30 LAB — CMP14+EGFR
A/G RATIO: 1.7 (ref 1.1–2.5)
ALK PHOS: 104 IU/L (ref 39–117)
ALT: 14 IU/L (ref 0–32)
AST: 19 IU/L (ref 0–40)
Albumin: 4.2 g/dL (ref 3.5–4.8)
BUN/Creatinine Ratio: 16 (ref 11–26)
BUN: 13 mg/dL (ref 8–27)
Bilirubin Total: 0.5 mg/dL (ref 0.0–1.2)
CO2: 24 mmol/L (ref 18–29)
Calcium: 9.2 mg/dL (ref 8.7–10.3)
Chloride: 102 mmol/L (ref 97–108)
Creatinine, Ser: 0.83 mg/dL (ref 0.57–1.00)
GFR calc Af Amer: 79 mL/min/{1.73_m2} (ref >59)
GFR, EST NON AFRICAN AMERICAN: 69 mL/min/{1.73_m2} (ref >59)
GLOBULIN, TOTAL: 2.5 g/dL (ref 1.5–4.5)
Glucose: 88 mg/dL (ref 65–99)
POTASSIUM: 3.9 mmol/L (ref 3.5–5.2)
SODIUM: 141 mmol/L (ref 134–144)
Total Protein: 6.7 g/dL (ref 6.0–8.5)

## 2014-06-30 LAB — NMR, LIPOPROFILE
CHOLESTEROL: 144 mg/dL (ref 100–199)
HDL CHOLESTEROL BY NMR: 53 mg/dL (ref >39)
HDL Particle Number: 39.3 umol/L (ref >=30.5)
LDL Particle Number: 802 nmol/L (ref <1000)
LDL Size: 20 nm (ref >20.5)
LDL-C: 69 mg/dL (ref 0–99)
LP-IR SCORE: 53 — AB (ref <=45)
Small LDL Particle Number: 345 nmol/L (ref <=527)
TRIGLYCERIDES BY NMR: 111 mg/dL (ref 0–149)

## 2014-06-30 LAB — VITAMIN D 25 HYDROXY (VIT D DEFICIENCY, FRACTURES): Vit D, 25-Hydroxy: 31.4 ng/mL (ref 30.0–100.0)

## 2014-12-19 ENCOUNTER — Encounter (INDEPENDENT_AMBULATORY_CARE_PROVIDER_SITE_OTHER): Payer: Self-pay

## 2014-12-19 ENCOUNTER — Ambulatory Visit (INDEPENDENT_AMBULATORY_CARE_PROVIDER_SITE_OTHER): Payer: Medicare Other | Admitting: Family Medicine

## 2014-12-19 ENCOUNTER — Encounter: Payer: Self-pay | Admitting: Family Medicine

## 2014-12-19 VITALS — BP 154/80 | HR 88 | Temp 98.6°F | Ht 63.0 in | Wt 161.2 lb

## 2014-12-19 DIAGNOSIS — I10 Essential (primary) hypertension: Secondary | ICD-10-CM

## 2014-12-19 DIAGNOSIS — M8580 Other specified disorders of bone density and structure, unspecified site: Secondary | ICD-10-CM | POA: Diagnosis not present

## 2014-12-19 DIAGNOSIS — G629 Polyneuropathy, unspecified: Secondary | ICD-10-CM

## 2014-12-19 DIAGNOSIS — E785 Hyperlipidemia, unspecified: Secondary | ICD-10-CM

## 2014-12-19 DIAGNOSIS — M25561 Pain in right knee: Secondary | ICD-10-CM | POA: Diagnosis not present

## 2014-12-19 MED ORDER — TRIAMTERENE-HCTZ 37.5-25 MG PO TABS: 1.0000 | ORAL_TABLET | Freq: Every day | ORAL | Status: DC

## 2014-12-19 MED ORDER — AMLODIPINE BESYLATE 10 MG PO TABS: 10.0000 mg | ORAL_TABLET | Freq: Every day | ORAL | Status: DC

## 2014-12-19 MED ORDER — MELOXICAM 15 MG PO TABS: 15.0000 mg | ORAL_TABLET | Freq: Every day | ORAL | Status: DC

## 2014-12-19 MED ORDER — RALOXIFENE HCL 60 MG PO TABS: 60.0000 mg | ORAL_TABLET | Freq: Every day | ORAL | Status: DC

## 2014-12-19 MED ORDER — ATORVASTATIN CALCIUM 40 MG PO TABS: 40.0000 mg | ORAL_TABLET | Freq: Every day | ORAL | Status: DC

## 2014-12-19 MED ORDER — GABAPENTIN 300 MG PO CAPS: 300.0000 mg | ORAL_CAPSULE | Freq: Three times a day (TID) | ORAL | Status: DC

## 2014-12-19 NOTE — Progress Notes (Signed)
Subjective:  Patient ID: Anita Perez, female    DOB: 09-25-1935  Age: 79 y.o. MRN: [id number]  CC: Hypertension; Hyperlipidemia; and Peripheral Neuropathy   HPI Anita Perez presents for  follow-up of hypertension. Patient has no history of headache chest pain or shortness of breath or recent cough. Patient also denies symptoms of TIA such as numbness weakness lateralizing. Patient checks  blood pressure at home and has not had any elevated readings recently. Patient denies side effects from his medication. States taking it regularly.  Patient also  in for follow-up of elevated cholesterol. Doing well without complaints on current medication. Denies side effects of statin including myalgia and arthralgia and nausea. Also in today for liver function testing. Currently no chest pain, shortness of breath or other cardiovascular related symptoms noted.  Numbness LLE.  Since surgery for back in March 2015. Has pain in right knee. Not sure meloxicam helps. [name] has a past medical history of Hypertension; GERD (gastroesophageal reflux disease); Hiatal hernia; Rectal carcinoid tumor; Colon polyps; Hyperlipidemia; DDD (degenerative disc disease), lumbar; and Osteopenia.   She has past surgical history that includes Breast surgery; Knee surgery (Right); fatty tumor removed left side; intestinal  tear; Appendectomy; Cosmetic surgery (Left); Colon surgery; and Lumbar laminectomy/decompression microdiscectomy (Left, 06/14/2013).   Her family history includes Cancer in her sister and sister; Early death in her brother; Hypertension in her father and mother; Kidney disease in her father and mother.She reports that she quit smoking about 32 years ago. Her smoking use included Cigarettes. She does not have any smokeless tobacco history on file. She reports that she drinks alcohol. She reports that she does not use illicit drugs.  Current Outpatient Prescriptions on File Prior to Visit    Medication Sig Dispense Refill  . aspirin 81 MG tablet Take 81 mg by mouth daily.    . Calcium Carbonate-Vitamin D (CALCIUM + D PO) Take 600 mg by mouth daily.    . Cholecalciferol (VITAMIN D) 2000 UNITS CAPS Take 3,000 Units by mouth daily.    . fish oil-omega-3 fatty acids 1000 MG capsule Take 1 g by mouth daily.     No current facility-administered medications on file prior to visit.    ROS Review of Systems  Constitutional: Negative for fever, chills, diaphoresis, appetite change, fatigue and unexpected weight change.  HENT: Negative for congestion, ear pain, hearing loss, postnasal drip, rhinorrhea, sneezing, sore throat and trouble swallowing.   Eyes: Negative for pain.  Respiratory: Negative for cough, chest tightness and shortness of breath.   Cardiovascular: Negative for chest pain and palpitations.  Gastrointestinal: Negative for nausea, vomiting, abdominal pain, diarrhea and constipation.  Genitourinary: Negative for dysuria, frequency and menstrual problem.  Musculoskeletal: Negative for joint swelling and arthralgias.  Skin: Negative for rash.  Neurological: Negative for dizziness, weakness, numbness and headaches.  Psychiatric/Behavioral: Negative for dysphoric mood and agitation.    Objective:  BP 154/80 mmHg  Pulse 88  Temp(Src) 98.6 F (37 C) (Oral)  Ht 5' 3" (1.6 m)  Wt 161 lb 3.2 oz (73.12 kg)  BMI 28.56 kg/m2  BP Readings from Last 3 Encounters:  12/19/14 154/80  06/29/14 164/63  06/21/14 132/72    Wt Readings from Last 3 Encounters:  12/19/14 161 lb 3.2 oz (73.12 kg)  06/29/14 154 lb 9.6 oz (70.126 kg)  06/21/14 155 lb (70.308 kg)     Physical Exam  Constitutional: She is oriented to person, place, and time. She appears well-developed and well-nourished.  No distress.  HENT:  Head: Normocephalic and atraumatic.  Right Ear: External ear normal.  Left Ear: External ear normal.  Nose: Nose normal.  Mouth/Throat: Oropharynx is clear and moist.   Eyes: Conjunctivae and EOM are normal. Pupils are equal, round, and reactive to light.  Neck: Normal range of motion. Neck supple. No thyromegaly present.  Cardiovascular: Normal rate, regular rhythm and normal heart sounds.   No murmur heard. Pulmonary/Chest: Effort normal and breath sounds normal. No respiratory distress. She has no wheezes. She has no rales.  Abdominal: Soft. Bowel sounds are normal. She exhibits no distension. There is no tenderness.  Lymphadenopathy:    She has no cervical adenopathy.  Neurological: She is alert and oriented to person, place, and time. She has normal reflexes.  Skin: Skin is warm and dry.  Psychiatric: She has a normal mood and affect. Her behavior is normal. Judgment and thought content normal.    No results found for: HGBA1C  Lab Results  Component Value Date   WBC 6.4 06/29/2014   HGB 11.9* 06/29/2014   HCT 39.4 06/29/2014   PLT 353 06/06/2013   GLUCOSE 88 06/29/2014   CHOL 144 06/29/2014   TRIG 111 06/29/2014   HDL 53 06/29/2014   LDLCALC 125* 12/02/2013   ALT 14 06/29/2014   AST 19 06/29/2014   NA 141 06/29/2014   K 3.9 06/29/2014   CL 102 06/29/2014   CREATININE 0.83 06/29/2014   BUN 13 06/29/2014   CO2 24 06/29/2014    Dg Chest 2 View  06/06/2013   CLINICAL DATA:  Preop for back surgery.  Hypertension.  EXAM: CHEST  2 VIEW  COMPARISON:  None.  FINDINGS: Cardiac silhouette is normal in size and configuration. Normal mediastinal and hilar contours.  Clear lungs.  No pleural effusion or pneumothorax.  Bony thorax is demineralized but intact.  IMPRESSION: No active cardiopulmonary disease.   Electronically Signed   By: Lajean Manes M.D.   On: 06/06/2013 15:49    Assessment & Plan:   Anita Perez was seen today for hypertension, hyperlipidemia and peripheral neuropathy.  Diagnoses and all orders for this visit:  Essential hypertension, benign -     CBC with Differential/Platelet -     CMP14+EGFR  Hyperlipidemia with target LDL  less than 100 -     atorvastatin (LIPITOR) 40 MG tablet; Take 1 tablet (40 mg total) by mouth daily. -     CMP14+EGFR -     Lipid panel  Neuropathy -     gabapentin (NEURONTIN) 300 MG capsule; Take 1 capsule (300 mg total) by mouth 3 (three) times daily.  Right knee pain -     meloxicam (MOBIC) 15 MG tablet; Take 1 tablet (15 mg total) by mouth daily.  Osteopenia determined by x-ray -     raloxifene (EVISTA) 60 MG tablet; Take 1 tablet (60 mg total) by mouth daily. For bone health and breast cancer prevention  Other orders -     amLODipine (NORVASC) 10 MG tablet; Take 1 tablet (10 mg total) by mouth daily. -     triamterene-hydrochlorothiazide (MAXZIDE-25) 37.5-25 MG per tablet; Take 1 tablet by mouth daily.   I have changed Anita Perez's gabapentin. I am also having her start on triamterene-hydrochlorothiazide. Additionally, I am having her maintain her aspirin, Vitamin D, fish oil-omega-3 fatty acids, Calcium Carbonate-Vitamin D (CALCIUM + D PO), atorvastatin, amLODipine, meloxicam, and raloxifene.  Meds ordered this encounter  Medications  . DISCONTD: gabapentin (NEURONTIN) 300 MG capsule  Sig: Take 300 mg by mouth 3 (three) times daily.  Marland Kitchen atorvastatin (LIPITOR) 40 MG tablet    Sig: Take 1 tablet (40 mg total) by mouth daily.    Dispense:  90 tablet    Refill:  3  . gabapentin (NEURONTIN) 300 MG capsule    Sig: Take 1 capsule (300 mg total) by mouth 3 (three) times daily.    Dispense:  270 capsule    Refill:  3  . amLODipine (NORVASC) 10 MG tablet    Sig: Take 1 tablet (10 mg total) by mouth daily.    Dispense:  90 tablet    Refill:  3  . meloxicam (MOBIC) 15 MG tablet    Sig: Take 1 tablet (15 mg total) by mouth daily.    Dispense:  90 tablet    Refill:  3  . raloxifene (EVISTA) 60 MG tablet    Sig: Take 1 tablet (60 mg total) by mouth daily. For bone health and breast cancer prevention    Dispense:  90 tablet    Refill:  3  . triamterene-hydrochlorothiazide  (MAXZIDE-25) 37.5-25 MG per tablet    Sig: Take 1 tablet by mouth daily.    Dispense:  90 tablet    Refill:  3     Follow-up: Return in about 6 months (around 06/18/2015) for CPE.  Claretta Fraise, M.D.

## 2014-12-20 LAB — CMP14+EGFR
A/G RATIO: 1.5 (ref 1.1–2.5)
ALT: 16 IU/L (ref 0–32)
AST: 18 IU/L (ref 0–40)
Albumin: 4.3 g/dL (ref 3.5–4.8)
Alkaline Phosphatase: 101 IU/L (ref 39–117)
BILIRUBIN TOTAL: 0.5 mg/dL (ref 0.0–1.2)
BUN/Creatinine Ratio: 15 (ref 11–26)
BUN: 13 mg/dL (ref 8–27)
CHLORIDE: 102 mmol/L (ref 97–108)
CO2: 26 mmol/L (ref 18–29)
Calcium: 9.4 mg/dL (ref 8.7–10.3)
Creatinine, Ser: 0.84 mg/dL (ref 0.57–1.00)
GFR calc non Af Amer: 67 mL/min/{1.73_m2} (ref >59)
GFR, EST AFRICAN AMERICAN: 78 mL/min/{1.73_m2} (ref >59)
Globulin, Total: 2.8 g/dL (ref 1.5–4.5)
Glucose: 86 mg/dL (ref 65–99)
POTASSIUM: 3.9 mmol/L (ref 3.5–5.2)
Sodium: 144 mmol/L (ref 134–144)
TOTAL PROTEIN: 7.1 g/dL (ref 6.0–8.5)

## 2014-12-20 LAB — CBC WITH DIFFERENTIAL/PLATELET
BASOS: 1 %
Basophils Absolute: 0.1 10*3/uL (ref 0.0–0.2)
EOS (ABSOLUTE): 0.1 10*3/uL (ref 0.0–0.4)
Eos: 1 %
Hematocrit: 38.4 % (ref 34.0–46.6)
Hemoglobin: 12.2 g/dL (ref 11.1–15.9)
Immature Grans (Abs): 0 10*3/uL (ref 0.0–0.1)
Immature Granulocytes: 0 %
LYMPHS ABS: 2.1 10*3/uL (ref 0.7–3.1)
Lymphs: 27 %
MCH: 26.6 pg (ref 26.6–33.0)
MCHC: 31.8 g/dL (ref 31.5–35.7)
MCV: 84 fL (ref 79–97)
MONOS ABS: 0.6 10*3/uL (ref 0.1–0.9)
Monocytes: 7 %
NEUTROS ABS: 4.9 10*3/uL (ref 1.4–7.0)
Neutrophils: 64 %
PLATELETS: 283 10*3/uL (ref 150–379)
RBC: 4.59 x10E6/uL (ref 3.77–5.28)
RDW: 15.6 % — AB (ref 12.3–15.4)
WBC: 7.6 10*3/uL (ref 3.4–10.8)

## 2014-12-20 LAB — LIPID PANEL
Chol/HDL Ratio: 3.1 ratio units (ref 0.0–4.4)
Cholesterol, Total: 160 mg/dL (ref 100–199)
HDL: 52 mg/dL (ref >39)
LDL Calculated: 80 mg/dL (ref 0–99)
Triglycerides: 141 mg/dL (ref 0–149)
VLDL Cholesterol Cal: 28 mg/dL (ref 5–40)

## 2015-01-16 ENCOUNTER — Encounter: Payer: Self-pay | Admitting: Family Medicine

## 2015-01-30 ENCOUNTER — Ambulatory Visit (INDEPENDENT_AMBULATORY_CARE_PROVIDER_SITE_OTHER): Payer: Medicare Other | Admitting: *Deleted

## 2015-01-30 VITALS — BP 135/85

## 2015-01-30 DIAGNOSIS — I10 Essential (primary) hypertension: Secondary | ICD-10-CM

## 2015-01-30 NOTE — Progress Notes (Signed)
Pt here for BP check

## 2015-06-19 ENCOUNTER — Encounter: Payer: Self-pay | Admitting: Family Medicine

## 2015-06-19 ENCOUNTER — Ambulatory Visit (INDEPENDENT_AMBULATORY_CARE_PROVIDER_SITE_OTHER): Payer: Medicare Other | Admitting: Family Medicine

## 2015-06-19 VITALS — BP 148/79 | HR 88 | Temp 97.0°F | Ht 63.0 in | Wt 157.4 lb

## 2015-06-19 DIAGNOSIS — I10 Essential (primary) hypertension: Secondary | ICD-10-CM | POA: Diagnosis not present

## 2015-06-19 DIAGNOSIS — E785 Hyperlipidemia, unspecified: Secondary | ICD-10-CM

## 2015-06-19 MED ORDER — PREGABALIN 75 MG PO CAPS: 75.0000 mg | ORAL_CAPSULE | Freq: Every day | ORAL | Status: DC

## 2015-06-19 NOTE — Progress Notes (Addendum)
Subjective:  Patient ID: Anita Perez, female    DOB: Aug 03, 1935  Age: 80 y.o. MRN: [id number]  CC: Hypertension and Hyperlipidemia   HPI Anita Perez presents for  follow-up of hypertension. Patient has no history of headache chest pain or shortness of breath or recent cough. Patient also denies symptoms of TIA such as numbness weakness lateralizing. Patient checks  blood pressure at home and has not had any elevated readings recently. Patient denies side effects from his medication. States taking it regularly.  Patient also  in for follow-up of elevated cholesterol. Doing well without complaints on current medication. Denies side effects of statin including myalgia and arthralgia and nausea. Also in today for liver function testing. Currently no chest pain, shortness of breath or other cardiovascular related symptoms noted.  Mild to moderate LLE pain and numbness since back surgery. From lower 1/3 of leg to foot.  Daily,  [name] has a past medical history of Hypertension; GERD (gastroesophageal reflux disease); Hiatal hernia; Rectal carcinoid tumor; Colon polyps; Hyperlipidemia; DDD (degenerative disc disease), lumbar; and Osteopenia.   She has past surgical history that includes Breast surgery; Knee surgery (Right); fatty tumor removed left side; intestinal  tear; Appendectomy; Cosmetic surgery (Left); Colon surgery; and Lumbar laminectomy/decompression microdiscectomy (Left, 06/14/2013).   Her family history includes Cancer in her sister and sister; Early death in her brother; Hypertension in her father and mother; Kidney disease in her father and mother.She reports that she quit smoking about 33 years ago. Her smoking use included Cigarettes. She does not have any smokeless tobacco history on file. She reports that she drinks alcohol. She reports that she does not use illicit drugs.  Current Outpatient Prescriptions on File Prior to Visit  Medication Sig Dispense Refill  .  amLODipine (NORVASC) 10 MG tablet Take 1 tablet (10 mg total) by mouth daily. 90 tablet 3  . aspirin 81 MG tablet Take 81 mg by mouth daily.    Marland Kitchen atorvastatin (LIPITOR) 40 MG tablet Take 1 tablet (40 mg total) by mouth daily. 90 tablet 3  . Calcium Carbonate-Vitamin D (CALCIUM + D PO) Take 600 mg by mouth daily.    . Cholecalciferol (VITAMIN D) 2000 UNITS CAPS Take 3,000 Units by mouth daily.    . fish oil-omega-3 fatty acids 1000 MG capsule Take 1 g by mouth daily.    . meloxicam (MOBIC) 15 MG tablet Take 1 tablet (15 mg total) by mouth daily. 90 tablet 3  . raloxifene (EVISTA) 60 MG tablet Take 1 tablet (60 mg total) by mouth daily. For bone health and breast cancer prevention 90 tablet 3  . triamterene-hydrochlorothiazide (MAXZIDE-25) 37.5-25 MG per tablet Take 1 tablet by mouth daily. 90 tablet 3  . gabapentin (NEURONTIN) 300 MG capsule Take 1 capsule (300 mg total) by mouth 3 (three) times daily. (Patient not taking: Reported on 06/19/2015) 270 capsule 3   No current facility-administered medications on file prior to visit.    ROS Review of Systems  Objective:  BP 148/79 mmHg  Pulse 88  Temp(Src) 97 F (36.1 C) (Oral)  Ht '5\' 3"'  (1.6 m)  Wt 157 lb 6.4 oz (71.396 kg)  BMI 27.89 kg/m2  SpO2 98%  BP Readings from Last 3 Encounters:  06/19/15 148/79  01/30/15 135/85  12/19/14 154/80    Wt Readings from Last 3 Encounters:  06/19/15 157 lb 6.4 oz (71.396 kg)  12/19/14 161 lb 3.2 oz (73.12 kg)  06/29/14 154 lb 9.6 oz (70.126 kg)  Physical Exam  No results found for: HGBA1C  Lab Results  Component Value Date   WBC 7.6 12/19/2014   HGB 11.9* 06/29/2014   HCT 38.4 12/19/2014   PLT 283 12/19/2014   GLUCOSE 86 12/19/2014   CHOL 160 12/19/2014   TRIG 141 12/19/2014   HDL 52 12/19/2014   LDLCALC 80 12/19/2014   ALT 16 12/19/2014   AST 18 12/19/2014   NA 144 12/19/2014   K 3.9 12/19/2014   CL 102 12/19/2014   CREATININE 0.84 12/19/2014   BUN 13 12/19/2014   CO2 26  12/19/2014    Dg Chest 2 View  06/06/2013  CLINICAL DATA:  Preop for back surgery.  Hypertension. EXAM: CHEST  2 VIEW COMPARISON:  None. FINDINGS: Cardiac silhouette is normal in size and configuration. Normal mediastinal and hilar contours. Clear lungs.  No pleural effusion or pneumothorax. Bony thorax is demineralized but intact. IMPRESSION: No active cardiopulmonary disease. Electronically Signed   By: Lajean Manes M.D.   On: 06/06/2013 15:49    Assessment & Plan:   Anita Perez was seen today for hypertension and hyperlipidemia.  Diagnoses and all orders for this visit:  Essential hypertension, benign -     CMP14+EGFR; Standing -     Lipid panel; Standing -     CMP14+EGFR -     Lipid panel  Hyperlipidemia with target LDL less than 100 -     CMP14+EGFR; Standing -     Lipid panel; Standing -     CMP14+EGFR -     Lipid panel  Other orders -     pregabalin (LYRICA) 75 MG capsule; Take 1 capsule (75 mg total) by mouth daily. For leg pain   I am having Anita Perez start on pregabalin. I am also having her maintain her aspirin, Vitamin D, fish oil-omega-3 fatty acids, Calcium Carbonate-Vitamin D (CALCIUM + D PO), atorvastatin, gabapentin, amLODipine, meloxicam, raloxifene, and triamterene-hydrochlorothiazide.  Meds ordered this encounter  Medications  . pregabalin (LYRICA) 75 MG capsule    Sig: Take 1 capsule (75 mg total) by mouth daily. For leg pain    Dispense:  30 capsule    Refill:  5     Follow-up: Return in about 6 months (around 12/20/2015) for CPE.  Claretta Fraise, M.D.

## 2015-06-19 NOTE — Addendum Note (Signed)
Addended by: Claretta Fraise on: 06/19/2015 02:49 PM   Modules accepted: Orders, Level of Service

## 2015-06-20 LAB — LIPID PANEL
CHOL/HDL RATIO: 3.4 ratio (ref 0.0–4.4)
Cholesterol, Total: 163 mg/dL (ref 100–199)
HDL: 48 mg/dL (ref >39)
LDL CALC: 93 mg/dL (ref 0–99)
Triglycerides: 112 mg/dL (ref 0–149)
VLDL CHOLESTEROL CAL: 22 mg/dL (ref 5–40)

## 2015-06-20 LAB — CMP14+EGFR
A/G RATIO: 1.6 (ref 1.2–2.2)
ALT: 11 IU/L (ref 0–32)
AST: 17 IU/L (ref 0–40)
Albumin: 4.2 g/dL (ref 3.5–4.8)
Alkaline Phosphatase: 85 IU/L (ref 39–117)
BUN/Creatinine Ratio: 19 (ref 11–26)
BUN: 18 mg/dL (ref 8–27)
Bilirubin Total: 0.6 mg/dL (ref 0.0–1.2)
CALCIUM: 9.3 mg/dL (ref 8.7–10.3)
CO2: 23 mmol/L (ref 18–29)
CREATININE: 0.95 mg/dL (ref 0.57–1.00)
Chloride: 107 mmol/L — ABNORMAL HIGH (ref 96–106)
GFR, EST AFRICAN AMERICAN: 67 mL/min/{1.73_m2} (ref >59)
GFR, EST NON AFRICAN AMERICAN: 58 mL/min/{1.73_m2} — AB (ref >59)
Globulin, Total: 2.6 g/dL (ref 1.5–4.5)
Glucose: 97 mg/dL (ref 65–99)
POTASSIUM: 4 mmol/L (ref 3.5–5.2)
Sodium: 148 mmol/L — ABNORMAL HIGH (ref 134–144)
TOTAL PROTEIN: 6.8 g/dL (ref 6.0–8.5)

## 2015-08-08 ENCOUNTER — Telehealth: Payer: Self-pay | Admitting: Family Medicine

## 2015-09-25 ENCOUNTER — Telehealth: Payer: Self-pay | Admitting: Pharmacist

## 2015-12-24 ENCOUNTER — Ambulatory Visit (INDEPENDENT_AMBULATORY_CARE_PROVIDER_SITE_OTHER): Payer: Medicare Other | Admitting: Family Medicine

## 2015-12-24 ENCOUNTER — Encounter: Payer: Self-pay | Admitting: Family Medicine

## 2015-12-24 VITALS — BP 125/70 | HR 72 | Temp 97.5°F | Ht 63.0 in | Wt 154.6 lb

## 2015-12-24 DIAGNOSIS — Z23 Encounter for immunization: Secondary | ICD-10-CM | POA: Diagnosis not present

## 2015-12-24 DIAGNOSIS — M25561 Pain in right knee: Secondary | ICD-10-CM | POA: Diagnosis not present

## 2015-12-24 DIAGNOSIS — E785 Hyperlipidemia, unspecified: Secondary | ICD-10-CM | POA: Diagnosis not present

## 2015-12-24 DIAGNOSIS — G2581 Restless legs syndrome: Secondary | ICD-10-CM

## 2015-12-24 DIAGNOSIS — I10 Essential (primary) hypertension: Secondary | ICD-10-CM

## 2015-12-24 DIAGNOSIS — E559 Vitamin D deficiency, unspecified: Secondary | ICD-10-CM

## 2015-12-24 DIAGNOSIS — M8580 Other specified disorders of bone density and structure, unspecified site: Secondary | ICD-10-CM | POA: Diagnosis not present

## 2015-12-24 MED ORDER — AMLODIPINE BESYLATE 10 MG PO TABS
10.0000 mg | ORAL_TABLET | Freq: Every day | ORAL | 1 refills | Status: DC
Start: 2015-12-24 — End: 2016-05-13

## 2015-12-24 MED ORDER — ROPINIROLE HCL 0.5 MG PO TABS: 0.5000 mg | ORAL_TABLET | Freq: Every day | ORAL | 5 refills | Status: DC

## 2015-12-24 MED ORDER — MELOXICAM 15 MG PO TABS
15.0000 mg | ORAL_TABLET | Freq: Every day | ORAL | 1 refills | Status: DC
Start: 2015-12-24 — End: 2016-04-25

## 2015-12-24 MED ORDER — ATORVASTATIN CALCIUM 40 MG PO TABS: 40.0000 mg | ORAL_TABLET | Freq: Every day | ORAL | 1 refills | Status: DC

## 2015-12-24 MED ORDER — RALOXIFENE HCL 60 MG PO TABS: 60.0000 mg | ORAL_TABLET | Freq: Every day | ORAL | 1 refills | Status: DC

## 2015-12-24 NOTE — Addendum Note (Signed)
Addended by: Jamelle Haring on: 12/24/2015 03:43 PM   Modules accepted: Orders

## 2015-12-24 NOTE — Progress Notes (Signed)
Subjective:  Patient ID: Anita Perez, female    DOB: 23-Feb-1936  Age: 80 y.o. MRN: [id number]  CC: Hypertension (6 mos ckup) and Numbness (left leg, would like xray)   HPI Anita Perez presents for  follow-up of hypertension. Patient has no history of headache chest pain or shortness of breath or recent cough. Patient also denies symptoms of TIA such as numbness weakness lateralizing. Patient checks  blood pressure at home and has not had any elevated readings recently. Patient denies side effects from medication. States taking it regularly.  Patient in for follow-up of elevated cholesterol. Doing well without complaints on current medication. Denies side effects of statin including myalgia and arthralgia and nausea. Also in today for liver function testing. Currently no chest pain, shortness of breath or other cardiovascular related symptoms noted.  Patient states that she's no longer taking the Lyrica. She brings her meds including amlodipine and atorvastatin meloxicam and Evista. She is no longer taking the HCTZ triamterene combination either. She reports that leg cramps are waking her up during the night. They occur solely in the left calf region. There is no edema. It does not occur during the day. There is no redness or swelling associated.   [name] has a past medical history of Colon polyps; DDD (degenerative disc disease), lumbar; GERD (gastroesophageal reflux disease); Hiatal hernia; Hyperlipidemia; Hypertension; Osteopenia; and Rectal carcinoid tumor.   She has a past surgical history that includes Breast surgery; Knee surgery (Right); fatty tumor removed left side; intestinal  tear; Appendectomy; Cosmetic surgery (Left); Colon surgery; and Lumbar laminectomy/decompression microdiscectomy (Left, 06/14/2013).   Her family history includes Cancer in her sister and sister; Early death in her brother; Hypertension in her father and mother; Kidney disease in her father and  mother.She reports that she quit smoking about 33 years ago. Her smoking use included Cigarettes. She does not have any smokeless tobacco history on file. She reports that she drinks alcohol. She reports that she does not use drugs.  Current Outpatient Prescriptions on File Prior to Visit  Medication Sig Dispense Refill  . amLODipine (NORVASC) 10 MG tablet Take 1 tablet (10 mg total) by mouth daily. 90 tablet 3  . aspirin 81 MG tablet Take 81 mg by mouth daily.    Marland Kitchen atorvastatin (LIPITOR) 40 MG tablet Take 1 tablet (40 mg total) by mouth daily. 90 tablet 3  . Calcium Carbonate-Vitamin D (CALCIUM + D PO) Take 600 mg by mouth daily.    . Cholecalciferol (VITAMIN D) 2000 UNITS CAPS Take 3,000 Units by mouth daily.    . fish oil-omega-3 fatty acids 1000 MG capsule Take 1 g by mouth daily.    . meloxicam (MOBIC) 15 MG tablet Take 1 tablet (15 mg total) by mouth daily. 90 tablet 3  . pregabalin (LYRICA) 75 MG capsule Take 1 capsule (75 mg total) by mouth daily. For leg pain 30 capsule 5  . raloxifene (EVISTA) 60 MG tablet Take 1 tablet (60 mg total) by mouth daily. For bone health and breast cancer prevention 90 tablet 3  . triamterene-hydrochlorothiazide (MAXZIDE-25) 37.5-25 MG per tablet Take 1 tablet by mouth daily. 90 tablet 3   No current facility-administered medications on file prior to visit.     ROS Review of Systems  Constitutional: Negative for activity change, appetite change and fever.  HENT: Negative for congestion, rhinorrhea and sore throat.   Eyes: Negative for visual disturbance.  Respiratory: Negative for cough and shortness of breath.  Cardiovascular: Negative for chest pain and palpitations.  Gastrointestinal: Negative for abdominal pain, diarrhea and nausea.  Genitourinary: Negative for dysuria.  Musculoskeletal: Negative for arthralgias and myalgias.    Objective:  BP (!) 155/78   Pulse 76   Temp 97.5 F (36.4 C) (Oral)   Ht '5\' 3"'  (1.6 m)   Wt 154 lb 9.6 oz (70.1  kg)   BMI 27.39 kg/m   BP Readings from Last 3 Encounters:  12/24/15 (!) 155/78  06/19/15 (!) 148/79  01/30/15 135/85    Wt Readings from Last 3 Encounters:  12/24/15 154 lb 9.6 oz (70.1 kg)  06/19/15 157 lb 6.4 oz (71.4 kg)  12/19/14 161 lb 3.2 oz (73.1 kg)     Physical Exam  Constitutional: She is oriented to person, place, and time. She appears well-developed and well-nourished. No distress.  HENT:  Head: Normocephalic and atraumatic.  Right Ear: External ear normal.  Left Ear: External ear normal.  Nose: Nose normal.  Mouth/Throat: Oropharynx is clear and moist.  Eyes: Conjunctivae and EOM are normal. Pupils are equal, round, and reactive to light.  Neck: Normal range of motion. Neck supple. No thyromegaly present.  Cardiovascular: Normal rate, regular rhythm and normal heart sounds.   No murmur heard. Pulmonary/Chest: Effort normal and breath sounds normal. No respiratory distress. She has no wheezes. She has no rales.  Abdominal: Soft. Bowel sounds are normal. She exhibits no distension. There is no tenderness.  Lymphadenopathy:    She has no cervical adenopathy.  Neurological: She is alert and oriented to person, place, and time. She has normal reflexes.  Skin: Skin is warm and dry.  Psychiatric: She has a normal mood and affect. Her behavior is normal. Judgment and thought content normal.     Lab Results  Component Value Date   WBC 7.6 12/19/2014   HGB 11.9 (A) 06/29/2014   HCT 38.4 12/19/2014   PLT 283 12/19/2014   GLUCOSE 97 06/19/2015   CHOL 163 06/19/2015   TRIG 112 06/19/2015   HDL 48 06/19/2015   LDLCALC 93 06/19/2015   ALT 11 06/19/2015   AST 17 06/19/2015   NA 148 (H) 06/19/2015   K 4.0 06/19/2015   CL 107 (H) 06/19/2015   CREATININE 0.95 06/19/2015   BUN 18 06/19/2015   CO2 23 06/19/2015    Dg Chest 2 View  Result Date: 06/06/2013 CLINICAL DATA:  Preop for back surgery.  Hypertension. EXAM: CHEST  2 VIEW COMPARISON:  None. FINDINGS:  Cardiac silhouette is normal in size and configuration. Normal mediastinal and hilar contours. Clear lungs.  No pleural effusion or pneumothorax. Bony thorax is demineralized but intact. IMPRESSION: No active cardiopulmonary disease. Electronically Signed   By: Lajean Manes M.D.   On: 06/06/2013 15:49    Assessment & Plan:   [place] was seen today for hypertension and numbness.  Diagnoses and all orders for this visit:  Vitamin D deficiency -     CBC with Differential/Platelet -     CMP14+EGFR -     VITAMIN D 25 Hydroxy (Vit-D Deficiency, Fractures)  Hyperlipidemia with target LDL less than 100 -     CBC with Differential/Platelet -     CMP14+EGFR -     Lipid panel  Essential hypertension, benign -     CBC with Differential/Platelet -     CMP14+EGFR  Osteopenia -     CBC with Differential/Platelet -     CMP14+EGFR  Restless leg syndrome -  CBC with Differential/Platelet -     CMP14+EGFR  Other orders -     rOPINIRole (REQUIP) 0.5 MG tablet; Take 1 tablet (0.5 mg total) by mouth at bedtime.   I have discontinued Ms. Glaude's gabapentin. I am also having her start on rOPINIRole. Additionally, I am having her maintain her aspirin, Vitamin D, fish oil-omega-3 fatty acids, Calcium Carbonate-Vitamin D (CALCIUM + D PO), atorvastatin, amLODipine, meloxicam, raloxifene, triamterene-hydrochlorothiazide, and pregabalin.  Meds ordered this encounter  Medications  . rOPINIRole (REQUIP) 0.5 MG tablet    Sig: Take 1 tablet (0.5 mg total) by mouth at bedtime.    Dispense:  30 tablet    Refill:  5      Follow-up: Return in about 6 months (around 06/22/2016) for Wellness.  Claretta Fraise, M.D.

## 2015-12-25 ENCOUNTER — Other Ambulatory Visit: Payer: Self-pay | Admitting: Family Medicine

## 2015-12-25 LAB — CBC WITH DIFFERENTIAL/PLATELET
BASOS ABS: 0 10*3/uL (ref 0.0–0.2)
Basos: 1 %
EOS (ABSOLUTE): 0.1 10*3/uL (ref 0.0–0.4)
Eos: 1 %
Hematocrit: 34.2 % (ref 34.0–46.6)
Hemoglobin: 11 g/dL — ABNORMAL LOW (ref 11.1–15.9)
Immature Grans (Abs): 0 10*3/uL (ref 0.0–0.1)
Immature Granulocytes: 0 %
LYMPHS ABS: 1.7 10*3/uL (ref 0.7–3.1)
LYMPHS: 28 %
MCH: 26.9 pg (ref 26.6–33.0)
MCHC: 32.2 g/dL (ref 31.5–35.7)
MCV: 84 fL (ref 79–97)
Monocytes Absolute: 0.5 10*3/uL (ref 0.1–0.9)
Monocytes: 8 %
NEUTROS ABS: 3.8 10*3/uL (ref 1.4–7.0)
Neutrophils: 62 %
PLATELETS: 263 10*3/uL (ref 150–379)
RBC: 4.09 x10E6/uL (ref 3.77–5.28)
RDW: 15.3 % (ref 12.3–15.4)
WBC: 6.1 10*3/uL (ref 3.4–10.8)

## 2015-12-25 LAB — CMP14+EGFR
ALK PHOS: 78 IU/L (ref 39–117)
ALT: 15 IU/L (ref 0–32)
AST: 20 IU/L (ref 0–40)
Albumin/Globulin Ratio: 1.5 (ref 1.2–2.2)
Albumin: 4 g/dL (ref 3.5–4.8)
BILIRUBIN TOTAL: 0.4 mg/dL (ref 0.0–1.2)
BUN/Creatinine Ratio: 20 (ref 12–28)
BUN: 19 mg/dL (ref 8–27)
CHLORIDE: 101 mmol/L (ref 96–106)
CO2: 27 mmol/L (ref 18–29)
Calcium: 9.2 mg/dL (ref 8.7–10.3)
Creatinine, Ser: 0.95 mg/dL (ref 0.57–1.00)
GFR calc Af Amer: 66 mL/min/{1.73_m2} (ref >59)
GFR calc non Af Amer: 58 mL/min/{1.73_m2} — ABNORMAL LOW (ref >59)
GLUCOSE: 89 mg/dL (ref 65–99)
Globulin, Total: 2.7 g/dL (ref 1.5–4.5)
Potassium: 3.8 mmol/L (ref 3.5–5.2)
Sodium: 141 mmol/L (ref 134–144)
Total Protein: 6.7 g/dL (ref 6.0–8.5)

## 2015-12-25 LAB — LIPID PANEL
CHOLESTEROL TOTAL: 197 mg/dL (ref 100–199)
Chol/HDL Ratio: 4.5 ratio units — ABNORMAL HIGH (ref 0.0–4.4)
HDL: 44 mg/dL (ref >39)
LDL Calculated: 125 mg/dL — ABNORMAL HIGH (ref 0–99)
Triglycerides: 138 mg/dL (ref 0–149)
VLDL CHOLESTEROL CAL: 28 mg/dL (ref 5–40)

## 2015-12-25 LAB — VITAMIN D 25 HYDROXY (VIT D DEFICIENCY, FRACTURES): Vit D, 25-Hydroxy: 27.2 ng/mL — ABNORMAL LOW (ref 30.0–100.0)

## 2015-12-25 MED ORDER — VITAMIN D (ERGOCALCIFEROL) 1.25 MG (50000 UNIT) PO CAPS: 50000.0000 [IU] | ORAL_CAPSULE | ORAL | 0 refills | Status: DC

## 2016-04-24 DIAGNOSIS — I10 Essential (primary) hypertension: Secondary | ICD-10-CM | POA: Diagnosis not present

## 2016-04-24 DIAGNOSIS — K56699 Other intestinal obstruction unspecified as to partial versus complete obstruction: Secondary | ICD-10-CM | POA: Diagnosis not present

## 2016-04-24 DIAGNOSIS — Z9049 Acquired absence of other specified parts of digestive tract: Secondary | ICD-10-CM

## 2016-04-24 DIAGNOSIS — G629 Polyneuropathy, unspecified: Secondary | ICD-10-CM | POA: Diagnosis not present

## 2016-04-24 DIAGNOSIS — R1013 Epigastric pain: Secondary | ICD-10-CM | POA: Diagnosis not present

## 2016-04-24 DIAGNOSIS — K5651 Intestinal adhesions [bands], with partial obstruction: Principal | ICD-10-CM | POA: Diagnosis present

## 2016-04-24 DIAGNOSIS — R109 Unspecified abdominal pain: Secondary | ICD-10-CM | POA: Diagnosis not present

## 2016-04-24 DIAGNOSIS — M5136 Other intervertebral disc degeneration, lumbar region: Secondary | ICD-10-CM | POA: Diagnosis present

## 2016-04-24 DIAGNOSIS — Z86010 Personal history of colonic polyps: Secondary | ICD-10-CM

## 2016-04-24 DIAGNOSIS — K219 Gastro-esophageal reflux disease without esophagitis: Secondary | ICD-10-CM | POA: Diagnosis present

## 2016-04-24 DIAGNOSIS — G2581 Restless legs syndrome: Secondary | ICD-10-CM | POA: Diagnosis present

## 2016-04-24 DIAGNOSIS — E559 Vitamin D deficiency, unspecified: Secondary | ICD-10-CM | POA: Diagnosis present

## 2016-04-24 DIAGNOSIS — E785 Hyperlipidemia, unspecified: Secondary | ICD-10-CM | POA: Diagnosis not present

## 2016-04-24 DIAGNOSIS — D72829 Elevated white blood cell count, unspecified: Secondary | ICD-10-CM | POA: Diagnosis present

## 2016-04-24 DIAGNOSIS — Z8249 Family history of ischemic heart disease and other diseases of the circulatory system: Secondary | ICD-10-CM

## 2016-04-24 NOTE — ED Triage Notes (Signed)
Pt c/o mid upper abd pain onset about 3 hours ago.  Nausea without vomiting.  Denies diarrhea.  Last BM today

## 2016-04-25 ENCOUNTER — Encounter (HOSPITAL_COMMUNITY): Payer: Self-pay | Admitting: Emergency Medicine

## 2016-04-25 ENCOUNTER — Emergency Department (HOSPITAL_COMMUNITY): Payer: Medicare Other

## 2016-04-25 ENCOUNTER — Inpatient Hospital Stay (HOSPITAL_COMMUNITY)
Admission: EM | Admit: 2016-04-25 | Discharge: 2016-04-29 | DRG: 390 | Disposition: A | Discharge status: Home / Self Care | Payer: Medicare Other | Attending: Internal Medicine | Admitting: Internal Medicine

## 2016-04-25 ENCOUNTER — Observation Stay (HOSPITAL_COMMUNITY): Payer: Medicare Other

## 2016-04-25 DIAGNOSIS — G629 Polyneuropathy, unspecified: Secondary | ICD-10-CM

## 2016-04-25 DIAGNOSIS — K56609 Unspecified intestinal obstruction, unspecified as to partial versus complete obstruction: Secondary | ICD-10-CM | POA: Diagnosis present

## 2016-04-25 DIAGNOSIS — E785 Hyperlipidemia, unspecified: Secondary | ICD-10-CM | POA: Diagnosis present

## 2016-04-25 DIAGNOSIS — I10 Essential (primary) hypertension: Secondary | ICD-10-CM | POA: Diagnosis not present

## 2016-04-25 DIAGNOSIS — G2581 Restless legs syndrome: Secondary | ICD-10-CM

## 2016-04-25 DIAGNOSIS — R140 Abdominal distension (gaseous): Secondary | ICD-10-CM | POA: Diagnosis not present

## 2016-04-25 DIAGNOSIS — K869 Disease of pancreas, unspecified: Secondary | ICD-10-CM

## 2016-04-25 DIAGNOSIS — R109 Unspecified abdominal pain: Secondary | ICD-10-CM | POA: Diagnosis not present

## 2016-04-25 DIAGNOSIS — E559 Vitamin D deficiency, unspecified: Secondary | ICD-10-CM | POA: Diagnosis present

## 2016-04-25 HISTORY — DX: Unspecified intestinal obstruction, unspecified as to partial versus complete obstruction: K56.609

## 2016-04-25 HISTORY — DX: Diverticulitis of intestine, part unspecified, without perforation or abscess without bleeding: K57.92

## 2016-04-25 HISTORY — DX: Malignant neoplasm of rectum: C20

## 2016-04-25 HISTORY — DX: Unspecified osteoarthritis, unspecified site: M19.90

## 2016-04-25 LAB — CBC WITH DIFFERENTIAL/PLATELET
Basophils Absolute: 0 10*3/uL (ref 0.0–0.1)
Basophils Relative: 0 %
Eosinophils Absolute: 0.1 10*3/uL (ref 0.0–0.7)
Eosinophils Relative: 0 %
HEMATOCRIT: 39.2 % (ref 36.0–46.0)
HEMOGLOBIN: 12.4 g/dL (ref 12.0–15.0)
LYMPHS ABS: 1.6 10*3/uL (ref 0.7–4.0)
LYMPHS PCT: 12 %
MCH: 27.2 pg (ref 26.0–34.0)
MCHC: 31.6 g/dL (ref 30.0–36.0)
MCV: 86 fL (ref 78.0–100.0)
Monocytes Absolute: 0.6 10*3/uL (ref 0.1–1.0)
Monocytes Relative: 5 %
NEUTROS ABS: 11.2 10*3/uL — AB (ref 1.7–7.7)
Neutrophils Relative %: 83 %
PLATELETS: 289 10*3/uL (ref 150–400)
RBC: 4.56 MIL/uL (ref 3.87–5.11)
RDW: 14.2 % (ref 11.5–15.5)
WBC: 13.6 10*3/uL — AB (ref 4.0–10.5)

## 2016-04-25 LAB — AMYLASE: Amylase: 47 U/L (ref 28–100)

## 2016-04-25 LAB — CREATININE, SERUM
CREATININE: 1.02 mg/dL — AB (ref 0.44–1.00)
GFR calc Af Amer: 59 mL/min — ABNORMAL LOW (ref >60)
GFR, EST NON AFRICAN AMERICAN: 51 mL/min — AB (ref >60)

## 2016-04-25 LAB — COMPREHENSIVE METABOLIC PANEL
ALK PHOS: 68 U/L (ref 38–126)
ALT: 16 U/L (ref 14–54)
AST: 24 U/L (ref 15–41)
Albumin: 3.9 g/dL (ref 3.5–5.0)
Anion gap: 8 (ref 5–15)
BILIRUBIN TOTAL: 0.6 mg/dL (ref 0.3–1.2)
BUN: 13 mg/dL (ref 6–20)
CALCIUM: 9 mg/dL (ref 8.9–10.3)
CHLORIDE: 103 mmol/L (ref 101–111)
CO2: 27 mmol/L (ref 22–32)
CREATININE: 0.8 mg/dL (ref 0.44–1.00)
Glucose, Bld: 135 mg/dL — ABNORMAL HIGH (ref 65–99)
Potassium: 3.6 mmol/L (ref 3.5–5.1)
Sodium: 138 mmol/L (ref 135–145)
Total Protein: 7.1 g/dL (ref 6.5–8.1)

## 2016-04-25 LAB — LIPASE, BLOOD: LIPASE: 25 U/L (ref 11–51)

## 2016-04-25 LAB — URINALYSIS, ROUTINE W REFLEX MICROSCOPIC
Bilirubin Urine: NEGATIVE
Glucose, UA: NEGATIVE mg/dL
Hgb urine dipstick: NEGATIVE
Ketones, ur: 5 mg/dL — AB
Leukocytes, UA: NEGATIVE
Nitrite: NEGATIVE
PROTEIN: 30 mg/dL — AB
SPECIFIC GRAVITY, URINE: 1.018 (ref 1.005–1.030)
pH: 8 (ref 5.0–8.0)

## 2016-04-25 LAB — CBC
HCT: 38.8 % (ref 36.0–46.0)
HEMOGLOBIN: 12.3 g/dL (ref 12.0–15.0)
MCH: 27.5 pg (ref 26.0–34.0)
MCHC: 31.7 g/dL (ref 30.0–36.0)
MCV: 86.8 fL (ref 78.0–100.0)
Platelets: 268 10*3/uL (ref 150–400)
RBC: 4.47 MIL/uL (ref 3.87–5.11)
RDW: 14.3 % (ref 11.5–15.5)
WBC: 13.6 10*3/uL — ABNORMAL HIGH (ref 4.0–10.5)

## 2016-04-25 LAB — MAGNESIUM: MAGNESIUM: 1.9 mg/dL (ref 1.7–2.4)

## 2016-04-25 MED ORDER — ASPIRIN 81 MG PO CHEW
81.0000 mg | CHEWABLE_TABLET | Freq: Every day | ORAL | Status: DC
  Administered 2016-04-25 – 2016-04-29 (×5): 81 mg via ORAL
  Filled 2016-04-25 (×5): qty 1

## 2016-04-25 MED ORDER — GADOBENATE DIMEGLUMINE 529 MG/ML IV SOLN
15.0000 mL | Freq: Once | INTRAVENOUS | Status: AC | PRN
  Administered 2016-04-25: 15 mL via INTRAVENOUS

## 2016-04-25 MED ORDER — POTASSIUM CHLORIDE IN NACL 20-0.9 MEQ/L-% IV SOLN
INTRAVENOUS | Status: DC
  Administered 2016-04-25: 100 mL/h via INTRAVENOUS
  Administered 2016-04-25 – 2016-04-26 (×3): via INTRAVENOUS
  Administered 2016-04-27: 1 mL via INTRAVENOUS
  Administered 2016-04-27: 15:00:00 via INTRAVENOUS
  Filled 2016-04-25 (×7): qty 1000

## 2016-04-25 MED ORDER — RALOXIFENE HCL 60 MG PO TABS
60.0000 mg | ORAL_TABLET | Freq: Every day | ORAL | Status: DC
  Administered 2016-04-25 – 2016-04-26 (×2): 60 mg via ORAL
  Filled 2016-04-25 (×2): qty 1

## 2016-04-25 MED ORDER — MORPHINE SULFATE (PF) 2 MG/ML IV SOLN
2.0000 mg | INTRAVENOUS | Status: DC | PRN
  Administered 2016-04-25 – 2016-04-26 (×4): 2 mg via INTRAVENOUS
  Filled 2016-04-25 (×4): qty 1

## 2016-04-25 MED ORDER — ONDANSETRON 4 MG PO TBDP
4.0000 mg | ORAL_TABLET | Freq: Once | ORAL | Status: AC
  Administered 2016-04-25: 4 mg via ORAL

## 2016-04-25 MED ORDER — ENOXAPARIN SODIUM 40 MG/0.4ML ~~LOC~~ SOLN
40.0000 mg | Freq: Every day | SUBCUTANEOUS | Status: DC
  Administered 2016-04-25 – 2016-04-29 (×5): 40 mg via SUBCUTANEOUS
  Filled 2016-04-25 (×5): qty 0.4

## 2016-04-25 MED ORDER — TRIAMTERENE-HCTZ 37.5-25 MG PO TABS
1.0000 | ORAL_TABLET | Freq: Every day | ORAL | Status: DC
  Administered 2016-04-25: 1 via ORAL
  Filled 2016-04-25 (×2): qty 1

## 2016-04-25 MED ORDER — ONDANSETRON HCL 4 MG/2ML IJ SOLN
4.0000 mg | Freq: Once | INTRAMUSCULAR | Status: AC
  Administered 2016-04-25: 4 mg via INTRAVENOUS
  Filled 2016-04-25: qty 2

## 2016-04-25 MED ORDER — IOPAMIDOL (ISOVUE-300) INJECTION 61%
INTRAVENOUS | Status: AC
  Administered 2016-04-25: 100 mL
  Filled 2016-04-25: qty 100

## 2016-04-25 MED ORDER — HYDRALAZINE HCL 20 MG/ML IJ SOLN
10.0000 mg | Freq: Four times a day (QID) | INTRAMUSCULAR | Status: DC | PRN
  Administered 2016-04-26 – 2016-04-29 (×2): 10 mg via INTRAVENOUS
  Filled 2016-04-25 (×2): qty 1

## 2016-04-25 MED ORDER — VITAMIN D (ERGOCALCIFEROL) 1.25 MG (50000 UNIT) PO CAPS
50000.0000 [IU] | ORAL_CAPSULE | ORAL | Status: DC
  Filled 2016-04-25 (×2): qty 1

## 2016-04-25 MED ORDER — PREGABALIN 75 MG PO CAPS
75.0000 mg | ORAL_CAPSULE | Freq: Every day | ORAL | Status: DC
  Administered 2016-04-25 – 2016-04-26 (×2): 75 mg via ORAL
  Filled 2016-04-25: qty 1
  Filled 2016-04-25: qty 3

## 2016-04-25 MED ORDER — AMLODIPINE BESYLATE 10 MG PO TABS
10.0000 mg | ORAL_TABLET | Freq: Every day | ORAL | Status: DC
  Administered 2016-04-25 – 2016-04-26 (×2): 10 mg via ORAL
  Filled 2016-04-25: qty 1
  Filled 2016-04-25: qty 2

## 2016-04-25 MED ORDER — ORAL CARE MOUTH RINSE
15.0000 mL | Freq: Two times a day (BID) | OROMUCOSAL | Status: DC
  Administered 2016-04-25 – 2016-04-28 (×6): 15 mL via OROMUCOSAL

## 2016-04-25 MED ORDER — ATORVASTATIN CALCIUM 40 MG PO TABS
40.0000 mg | ORAL_TABLET | Freq: Every day | ORAL | Status: DC
  Administered 2016-04-25: 40 mg via ORAL
  Filled 2016-04-25: qty 1

## 2016-04-25 MED ORDER — CALCIUM CARBONATE-VITAMIN D 500-200 MG-UNIT PO TABS
1.0000 | ORAL_TABLET | Freq: Every day | ORAL | Status: DC
  Administered 2016-04-25 – 2016-04-29 (×5): 1 via ORAL
  Filled 2016-04-25 (×5): qty 1

## 2016-04-25 MED ORDER — ONDANSETRON 4 MG PO TBDP
ORAL_TABLET | ORAL | Status: AC
  Filled 2016-04-25: qty 1

## 2016-04-25 MED ORDER — MORPHINE SULFATE (PF) 4 MG/ML IV SOLN
4.0000 mg | Freq: Once | INTRAVENOUS | Status: AC
Start: 2016-04-25 — End: 2016-04-25
  Administered 2016-04-25: 4 mg via INTRAVENOUS
  Filled 2016-04-25: qty 1

## 2016-04-25 MED ORDER — POLYETHYLENE GLYCOL 3350 17 G PO PACK: 17.0000 g | PACK | Freq: Every day | ORAL | Status: DC | PRN

## 2016-04-25 MED ORDER — ROPINIROLE HCL 0.5 MG PO TABS
0.5000 mg | ORAL_TABLET | Freq: Every day | ORAL | Status: DC
  Administered 2016-04-25 – 2016-04-26 (×2): 0.5 mg via ORAL
  Filled 2016-04-25 (×2): qty 1

## 2016-04-25 MED ORDER — ONDANSETRON HCL 4 MG/2ML IJ SOLN
4.0000 mg | Freq: Four times a day (QID) | INTRAMUSCULAR | Status: DC | PRN
  Administered 2016-04-26: 4 mg via INTRAVENOUS
  Filled 2016-04-25 (×2): qty 2

## 2016-04-25 MED ORDER — ACETAMINOPHEN 650 MG RE SUPP: 650.0000 mg | Freq: Four times a day (QID) | RECTAL | Status: DC | PRN

## 2016-04-25 MED ORDER — ORAL CARE MOUTH RINSE
15.0000 mL | Freq: Two times a day (BID) | OROMUCOSAL | Status: DC
  Administered 2016-04-27 – 2016-04-28 (×4): 15 mL via OROMUCOSAL

## 2016-04-25 MED ORDER — TRAZODONE HCL 50 MG PO TABS: 25.0000 mg | ORAL_TABLET | Freq: Every evening | ORAL | Status: DC | PRN

## 2016-04-25 MED ORDER — ONDANSETRON HCL 4 MG PO TABS
4.0000 mg | ORAL_TABLET | Freq: Four times a day (QID) | ORAL | Status: DC | PRN
Start: 2016-04-25 — End: 2016-04-29

## 2016-04-25 MED ORDER — ASPIRIN 81 MG PO TABS: 81.0000 mg | ORAL_TABLET | Freq: Every day | ORAL | Status: DC

## 2016-04-25 MED ORDER — ACETAMINOPHEN 325 MG PO TABS: 650.0000 mg | ORAL_TABLET | Freq: Four times a day (QID) | ORAL | Status: DC | PRN

## 2016-04-25 NOTE — H&P (Signed)
History and Physical    Anita Perez [id number] DOB: 1935/07/28 DOA: 04/25/2016  PCP: Claretta Fraise, MD   Patient coming from: home  Chief Complaint: Abdominal pain   HPI: Anita Perez is a 81 y.o. female with medical history significant of HTN, Hyperlipidemia, DDD of lumbar spine, vit D deficiency, GERD, restless leg syndrome and neuropathy who presented to the ED with c/o abdominal pain, nausea. Onset of symptoms around 6 pm l;ast night and pain was gradually increasing. Patient reported having similar symptoms in the past, but a definitive diagnosis has never been established except of diverticulitis requiring colostomy approximately 12 years.  ED Course: on arrival to the ED patient had one episode of nausea, her VS were stable except intermittent episodes of elevated BP. Blood work showed normal electrolytes, but elevated WBC count to 13,6000. UA was not suggestive of UTI Abdominal CT showed SBO with transition point just below umbilicus possibly associated with adhesions and also an incidental finding of potential clinical significance was 11 mm low-density lesion in the pancreatic head, new from 2006 and indeterminate. Recommend follow-up contrast-enhanced abdominal MRI or pancreatic protocol CT in 2 years   Review of Systems: As per HPI otherwise 10 point review of systems negative.   Ambulatory Status: Ambulates independently, exerciases  Past Medical History:  Diagnosis Date  . Colon polyps   . DDD (degenerative disc disease), lumbar   . GERD (gastroesophageal reflux disease)   . Hiatal hernia   . Hyperlipidemia   . Hypertension   . Osteopenia   . Rectal carcinoid tumor     Past Surgical History:  Procedure Laterality Date  . APPENDECTOMY    . BREAST SURGERY     cyst removed  . COLON SURGERY     polyp/ colostomy (removed now)  . COSMETIC SURGERY Left    hand  . fatty tumor removed left side    . intestinal  tear    . KNEE SURGERY Right    arthroscopic    . LUMBAR LAMINECTOMY/DECOMPRESSION MICRODISCECTOMY Left 06/14/2013   Procedure: LUMBAR LAMINECTOMY/DECOMPRESSION MICRODISCECTOMY LEFT LUMBAR FOUR-FIVE;  Surgeon: Faythe Ghee, MD;  Location: [facility];  Service: Neurosurgery;  Laterality: Left;    Social History   Social History  . Marital status: Widowed    Spouse name: N/A  . Number of children: N/A  . Years of education: N/A   Occupational History  . Not on file.   Social History Main Topics  . Smoking status: Former Smoker    Types: Cigarettes    Quit date: 06/25/1982  . Smokeless tobacco: Never Used  . Alcohol use Yes     Comment: socially - wine  . Drug use: No  . Sexual activity: Yes   Other Topics Concern  . Not on file   Social History Narrative  . No narrative on file    Allergies  Allergen Reactions  . Pepto-Bismol [Bismuth] Other (See Comments)    Turned mouth black inside    Family History  Problem Relation Age of Onset  . Kidney disease Mother   . Hypertension Mother   . Kidney disease Father   . Hypertension Father   . Cancer Sister   . Cancer Sister   . Early death Brother     AT 70 MONTHS OLD    Prior to Admission medications   Medication Sig Start Date End Date Taking? Authorizing Provider  amLODipine (NORVASC) 10 MG tablet Take 1 tablet (10 mg total) by mouth daily. 12/24/15  Yes Claretta Fraise, MD  aspirin 81 MG tablet Take 81 mg by mouth daily.   Yes Historical Provider, MD  atorvastatin (LIPITOR) 40 MG tablet Take 1 tablet (40 mg total) by mouth daily. 12/24/15  Yes Claretta Fraise, MD  Calcium Carbonate-Vitamin D (CALCIUM + D PO) Take 600 mg by mouth daily.   Yes Historical Provider, MD  pregabalin (LYRICA) 75 MG capsule Take 1 capsule (75 mg total) by mouth daily. For leg pain 06/19/15  Yes Claretta Fraise, MD  raloxifene (EVISTA) 60 MG tablet Take 1 tablet (60 mg total) by mouth daily. For bone health and breast cancer prevention 12/24/15  Yes Claretta Fraise, MD  rOPINIRole (REQUIP) 0.5 MG  tablet Take 1 tablet (0.5 mg total) by mouth at bedtime. 12/24/15  Yes Claretta Fraise, MD  triamterene-hydrochlorothiazide (MAXZIDE-25) 37.5-25 MG per tablet Take 1 tablet by mouth daily. 12/19/14  Yes Claretta Fraise, MD  Vitamin D, Ergocalciferol, (DRISDOL) 50000 units CAPS capsule Take 1 capsule (50,000 Units total) by mouth 2 (two) times a week. 12/27/15  Yes Claretta Fraise, MD  meloxicam (MOBIC) 15 MG tablet Take 1 tablet (15 mg total) by mouth daily. Patient not taking: Reported on 04/25/2016 12/24/15   Claretta Fraise, MD    Physical Exam: Vitals:   04/25/16 0800 04/25/16 0815 04/25/16 0830 04/25/16 0845  BP: 185/78 164/72 (!) 160/131 143/81  Pulse: 77 75 78 75  Resp:      Temp:      TempSrc:      SpO2: 98% 98% 100% 99%  Weight:      Height:         General: Appears calm and comfortable Eyes: PERRLA, EOMI, normal lids, iris ENT:  grossly normal hearing, lips & tongue, mucous membranes moist and intact Neck: no lymphoadenopathy, masses or thyromegaly Cardiovascular: RRR, no m/r/g. No JVD, carotid bruits. No LE edema.  Respiratory: bilateral no wheezes, rales, rhonchi or cracles. Normal respiratory effort. No accessory muscle use observed Abdomen: soft, non-tender, non-distended, no organomegaly or masses appreciated. BS present in all quadrants Skin: no rash, ulcers or induration seen on limited exam Musculoskeletal: grossly normal tone BUE/BLE, good ROM, no bony abnormality or joint deformities observed Psychiatric: grossly normal mood and affect, speech fluent and appropriate, alert and oriented x3 Neurologic: CN II-XII grossly intact, moves all extremities in coordinated fashion, sensation intact  Labs on Admission: I have personally reviewed following labs and imaging studies  CBC, CMP  GFR: Estimated Creatinine Clearance: 54.9 mL/min (by C-G formula based on SCr of 0.8 mg/dL).  Creatinine Clearance: Estimated Creatinine Clearance: 54.9 mL/min (by C-G formula based on SCr of  0.8 mg/dL).  Radiological Exams on Admission: Ct Abdomen Pelvis W Contrast  Result Date: 04/25/2016 CLINICAL DATA:  Mid upper abdominal pain beginning today with nausea. Prior appendectomy. EXAM: CT ABDOMEN AND PELVIS WITH CONTRAST TECHNIQUE: Multidetector CT imaging of the abdomen and pelvis was performed using the standard protocol following bolus administration of intravenous contrast. CONTRAST:  154mL ISOVUE-300 IOPAMIDOL (ISOVUE-300) INJECTION 61% COMPARISON:  12/13/2004 FINDINGS: Lower chest: Subsegmental atelectasis in the lung bases. No pleural effusion. Hepatobiliary: Hypoattenuating/ hypoenhancing liver lesions measures 3 mm in the posterior right hepatic lobe (series 301, image 9), 1.6 cm in the medial segment left hepatic lobe (series 301, image 20), and 10 mm more inferiorly in the right hepatic lobe (series 301, image 22), not significantly changed in size and compatible with a benign etiology. 12 mm low-density lesion in segment II on the prior CT is no  longer clearly identified. The gallbladder is unremarkable. There is no biliary dilatation. Pancreas: 11 mm hypoattenuating lesion in the pancreatic head which appears new. No significant pancreatic ductal dilatation or atrophy. Spleen: Unremarkable. Adrenals/Urinary Tract: Unremarkable adrenal glands. No evidence of renal mass, calculi, or hydronephrosis. Decompressed bladder. Stomach/Bowel: The stomach is within normal limits. There is mild diffuse dilatation of proximal small bowel loops measuring up to approximately 3.2 cm in diameter with fecalized contents in the central abdomen. There is a transition to decompressed small bowel in the midline the central abdomen just below the level of the umbilicus (series [id number], image 47). Scattered surgical clips are noted in this general region. There is prominent mucosal enhancement involving the transitional segment of small bowel at this location, without a discrete mass identified. The remainder of  the more distal small bowel is decompressed. The colon is also largely decompressed, containing only a small amount of gas and stool. Colonic diverticulosis is noted without evidence of diverticulitis. Prior appendectomy. Vascular/Lymphatic: Abdominal aortic atherosclerosis without aneurysm. No enlarged lymph nodes. Reproductive: Uterus and bilateral adnexa are unremarkable. Other: No intraperitoneal free fluid. No abdominal wall mass or hernia. Musculoskeletal: Moderate lumbar disc and facet degeneration. IMPRESSION: 1. Small bowel obstruction with transition point in the central abdomen just below the umbilicus, possibly secondary to adhesions. 2. **An incidental finding of potential clinical significance has been found. 11 mm low-density lesion in the pancreatic head, new from 2006 and indeterminate. Recommend follow-up contrast-enhanced abdominal MRI or pancreatic protocol CT in 2 years.** Study reviewed in person with Dr. Roxanne Mins on [id number] at 7:37 a.m. Electronically Signed   By: Logan Bores M.D.   On: 04/25/2016 08:12    EKG: Independently reviewed - pending  Assessment/Plan Principal Problem:   SBO (small bowel obstruction) Active Problems:   Essential hypertension, benign   Hyperlipidemia with target LDL less than 100   Neuropathy (HCC)   Vitamin D deficiency   Restless leg syndrome    Abdominal ap in associated most likely with SBO, although there is a new lesion found in pancreatic head on CT Proceed with MRI for definitive diagnosis Continue to keep NPO, provide pain and nausea control, check Mag level If remains nausea and pain free - proceed with po challenge Contrast enhanced abdominal MRI ordered  HTN  Continue home meds and follow BP readings, adjust doses if needed Hydralazine IV was added prn with parameters   Hyperlipidemia - most recent lipid profile on on 9/25/17demonstrated total cholesterol 197, triglycerides 138, HDL 44 and LDL 125 Continue statin therapy and  adjust the doses if needed  Restless leg syndrome , neuropathy Continue Requip and Lyrica  Vit D deficiency Continue vit D3 twice a week as before  DVT prophylaxis: Lovenox  Code Status: Full Family Communication: at bedside Disposition Plan: Med Surg Consults called:  none  Admission status: observation   York Grice, [place] Pager: [phone] [facility]  If 7PM-7AM, please contact night-coverage [email or web address] Password [facility]  04/25/2016, 8:55 AM

## 2016-04-25 NOTE — ED Provider Notes (Signed)
[facility] DEPT Provider Note   CSN: [id number] Arrival date & time: 04/24/16  2341     History   Chief Complaint Chief Complaint  Patient presents with  . Abdominal Pain    HPI Anita Perez is a 81 y.o. female.  She had onset at about 6 PM of crampy periumbilical pain which has persisted through the night. There was initial nausea, but that has resolved. She rates pain at 10/10 at its worst, 6/10 currently. There is no radiation of pain to the back, chest, shoulder. She has had similar pains in the past but has never had an actual diagnosis. She states she does have a history of an umbilical hernia and she thinks that is what is causing her pain. There's been no fever, chills, sweats. She has had normal bowel movements. Nothing makes the pain better nothing makes it worse.   The history is provided by the patient.  Abdominal Pain      Past Medical History:  Diagnosis Date  . Colon polyps   . DDD (degenerative disc disease), lumbar   . GERD (gastroesophageal reflux disease)   . Hiatal hernia   . Hyperlipidemia   . Hypertension   . Osteopenia   . Rectal carcinoid tumor     Patient Active Problem List   Diagnosis Date Noted  . Vitamin D deficiency 12/24/2015  . Osteopenia 06/21/2014  . Essential hypertension, benign 12/02/2013  . Hyperlipidemia with target LDL less than 100 12/02/2013  . Neuropathy ([place]) 12/02/2013  . Lumbar disc herniation 06/14/2013    Past Surgical History:  Procedure Laterality Date  . APPENDECTOMY    . BREAST SURGERY     cyst removed  . COLON SURGERY     polyp/ colostomy (removed now)  . COSMETIC SURGERY Left    hand  . fatty tumor removed left side    . intestinal  tear    . KNEE SURGERY Right    arthroscopic  . LUMBAR LAMINECTOMY/DECOMPRESSION MICRODISCECTOMY Left 06/14/2013   Procedure: LUMBAR LAMINECTOMY/DECOMPRESSION MICRODISCECTOMY LEFT LUMBAR FOUR-FIVE;  Surgeon: Faythe Ghee, MD;  Location: [facility];  Service:  Neurosurgery;  Laterality: Left;    OB History    No data available       Home Medications    Prior to Admission medications   Medication Sig Start Date End Date Taking? Authorizing Provider  amLODipine (NORVASC) 10 MG tablet Take 1 tablet (10 mg total) by mouth daily. 12/24/15  Yes Claretta Fraise, MD  aspirin 81 MG tablet Take 81 mg by mouth daily.   Yes Historical Provider, MD  atorvastatin (LIPITOR) 40 MG tablet Take 1 tablet (40 mg total) by mouth daily. 12/24/15  Yes Claretta Fraise, MD  Calcium Carbonate-Vitamin D (CALCIUM + D PO) Take 600 mg by mouth daily.   Yes Historical Provider, MD  pregabalin (LYRICA) 75 MG capsule Take 1 capsule (75 mg total) by mouth daily. For leg pain 06/19/15  Yes Claretta Fraise, MD  raloxifene (EVISTA) 60 MG tablet Take 1 tablet (60 mg total) by mouth daily. For bone health and breast cancer prevention 12/24/15  Yes Claretta Fraise, MD  rOPINIRole (REQUIP) 0.5 MG tablet Take 1 tablet (0.5 mg total) by mouth at bedtime. 12/24/15  Yes Claretta Fraise, MD  triamterene-hydrochlorothiazide (MAXZIDE-25) 37.5-25 MG per tablet Take 1 tablet by mouth daily. 12/19/14  Yes Claretta Fraise, MD  Vitamin D, Ergocalciferol, (DRISDOL) 50000 units CAPS capsule Take 1 capsule (50,000 Units total) by mouth 2 (two) times a  week. 12/27/15  Yes Claretta Fraise, MD  meloxicam (MOBIC) 15 MG tablet Take 1 tablet (15 mg total) by mouth daily. Patient not taking: Reported on 04/25/2016 12/24/15   Claretta Fraise, MD    Family History Family History  Problem Relation Age of Onset  . Kidney disease Mother   . Hypertension Mother   . Kidney disease Father   . Hypertension Father   . Cancer Sister   . Cancer Sister   . Early death Brother     AT 49 MONTHS OLD    Social History Social History  Substance Use Topics  . Smoking status: Former Smoker    Types: Cigarettes    Quit date: 06/25/1982  . Smokeless tobacco: Never Used  . Alcohol use Yes     Comment: socially - wine     Allergies     Pepto-bismol [bismuth]   Review of Systems Review of Systems  Gastrointestinal: Positive for abdominal pain.  All other systems reviewed and are negative.    Physical Exam Updated Vital Signs BP 168/87   Pulse 73   Temp 97.7 F (36.5 C) (Oral)   Resp 16   Ht 5\' 4"  (1.626 m)   Wt 150 lb (68 kg)   SpO2 98%   BMI 25.75 kg/m   Physical Exam  Nursing note and vitals reviewed.  81 year old female, resting comfortably and in no acute distress. Vital signs are significant for hypertension. Oxygen saturation is 98%, which is normal. Head is normocephalic and atraumatic. PERRLA, EOMI. Oropharynx is clear. Neck is nontender and supple without adenopathy or JVD. Back is nontender and there is no CVA tenderness. Lungs are clear without rales, wheezes, or rhonchi. Chest is nontender. Heart has regular rate and rhythm without murmur. Abdomen is soft, flat, with moderate tenderness in the periumbilical area and left lower quadrant. Worst tenderness is in the left lower quadrant. There is no rebound or guarding. There are no masses or hepatosplenomegaly and peristalsis is hypoactive. Extremities have no cyanosis or edema, full range of motion is present. Skin is warm and dry without rash. Neurologic: Mental status is normal, cranial nerves are intact, there are no motor or sensory deficits.  ED Treatments / Results  Labs (all labs ordered are listed, but only abnormal results are displayed) Labs Reviewed  CBC WITH DIFFERENTIAL/PLATELET - Abnormal; Notable for the following:       Result Value   WBC 13.6 (*)    Neutro Abs 11.2 (*)    All other components within normal limits  COMPREHENSIVE METABOLIC PANEL - Abnormal; Notable for the following:    Glucose, Bld 135 (*)    All other components within normal limits  URINALYSIS, ROUTINE W REFLEX MICROSCOPIC - Abnormal; Notable for the following:    APPearance HAZY (*)    Ketones, ur 5 (*)    Protein, ur 30 (*)    Bacteria, UA RARE  (*)    Squamous Epithelial / LPF 0-5 (*)    All other components within normal limits    Radiology No results found.  Procedures Procedures (including critical care time)  Medications Ordered in ED Medications  ondansetron (ZOFRAN-ODT) 4 MG disintegrating tablet (not administered)  ondansetron (ZOFRAN-ODT) disintegrating tablet 4 mg (4 mg Oral Given 04/25/16 0021)  morphine 4 MG/ML injection 4 mg (4 mg Intravenous Given 04/25/16 0633)  ondansetron (ZOFRAN) injection 4 mg (4 mg Intravenous Given 04/25/16 0631)  iopamidol (ISOVUE-300) 61 % injection (100 mLs  Contrast Given 04/25/16 0653)  Initial Impression / Assessment and Plan / ED Course  I have reviewed the triage vital signs and the nursing notes.  Pertinent labs & imaging results that were available during my care of the patient were reviewed by me and considered in my medical decision making (see chart for details). I have personally discussed the CT findings with radiologist.  Abdominal pain of uncertain cause. Suspect possible diverticulitis. No evidence of umbilical hernia on exam. She screening labs showed mild leukocytosis with left shift. She sent for CT of abdomen and pelvis which shows evidence of partial small bowel obstruction with transition point in the periumbilical area. She will need to be admitted. Case is discussed with Dr. Nehemiah Settle [facility], who agrees to admit the patient.  Final Clinical Impressions(s) / ED Diagnoses   Final diagnoses:  Small bowel obstruction    New Prescriptions New Prescriptions   No medications on file     Delora Fuel, MD [id number]

## 2016-04-25 NOTE — ED Notes (Signed)
Pt vomiting, Zofran 4mg  ODT given for same

## 2016-04-25 NOTE — ED Notes (Signed)
Called CT, ready w/ IV

## 2016-04-25 NOTE — ED Notes (Signed)
Patient transported to CT 

## 2016-04-26 ENCOUNTER — Observation Stay (HOSPITAL_COMMUNITY): Payer: Medicare Other

## 2016-04-26 ENCOUNTER — Inpatient Hospital Stay (HOSPITAL_COMMUNITY): Payer: Medicare Other

## 2016-04-26 DIAGNOSIS — G2581 Restless legs syndrome: Secondary | ICD-10-CM | POA: Diagnosis not present

## 2016-04-26 DIAGNOSIS — Z9049 Acquired absence of other specified parts of digestive tract: Secondary | ICD-10-CM | POA: Diagnosis not present

## 2016-04-26 DIAGNOSIS — M5136 Other intervertebral disc degeneration, lumbar region: Secondary | ICD-10-CM | POA: Diagnosis present

## 2016-04-26 DIAGNOSIS — Z86010 Personal history of colonic polyps: Secondary | ICD-10-CM | POA: Diagnosis not present

## 2016-04-26 DIAGNOSIS — I10 Essential (primary) hypertension: Secondary | ICD-10-CM | POA: Diagnosis not present

## 2016-04-26 DIAGNOSIS — G629 Polyneuropathy, unspecified: Secondary | ICD-10-CM | POA: Diagnosis not present

## 2016-04-26 DIAGNOSIS — E559 Vitamin D deficiency, unspecified: Secondary | ICD-10-CM | POA: Diagnosis present

## 2016-04-26 DIAGNOSIS — K56609 Unspecified intestinal obstruction, unspecified as to partial versus complete obstruction: Secondary | ICD-10-CM

## 2016-04-26 DIAGNOSIS — K5669 Other partial intestinal obstruction: Secondary | ICD-10-CM | POA: Diagnosis not present

## 2016-04-26 DIAGNOSIS — E785 Hyperlipidemia, unspecified: Secondary | ICD-10-CM

## 2016-04-26 DIAGNOSIS — K219 Gastro-esophageal reflux disease without esophagitis: Secondary | ICD-10-CM | POA: Diagnosis present

## 2016-04-26 DIAGNOSIS — Z8249 Family history of ischemic heart disease and other diseases of the circulatory system: Secondary | ICD-10-CM | POA: Diagnosis not present

## 2016-04-26 DIAGNOSIS — K5651 Intestinal adhesions [bands], with partial obstruction: Secondary | ICD-10-CM | POA: Diagnosis present

## 2016-04-26 DIAGNOSIS — D72829 Elevated white blood cell count, unspecified: Secondary | ICD-10-CM | POA: Diagnosis present

## 2016-04-26 DIAGNOSIS — R1013 Epigastric pain: Secondary | ICD-10-CM | POA: Diagnosis not present

## 2016-04-26 LAB — CBC
HCT: 37.3 % (ref 36.0–46.0)
Hemoglobin: 11.7 g/dL — ABNORMAL LOW (ref 12.0–15.0)
MCH: 27.4 pg (ref 26.0–34.0)
MCHC: 31.4 g/dL (ref 30.0–36.0)
MCV: 87.4 fL (ref 78.0–100.0)
PLATELETS: 264 10*3/uL (ref 150–400)
RBC: 4.27 MIL/uL (ref 3.87–5.11)
RDW: 14.6 % (ref 11.5–15.5)
WBC: 10.9 10*3/uL — ABNORMAL HIGH (ref 4.0–10.5)

## 2016-04-26 LAB — BASIC METABOLIC PANEL
Anion gap: 9 (ref 5–15)
BUN: 16 mg/dL (ref 6–20)
CALCIUM: 9 mg/dL (ref 8.9–10.3)
CO2: 27 mmol/L (ref 22–32)
Chloride: 102 mmol/L (ref 101–111)
Creatinine, Ser: 1.03 mg/dL — ABNORMAL HIGH (ref 0.44–1.00)
GFR calc Af Amer: 59 mL/min — ABNORMAL LOW (ref >60)
GFR, EST NON AFRICAN AMERICAN: 51 mL/min — AB (ref >60)
GLUCOSE: 122 mg/dL — AB (ref 65–99)
Potassium: 4.3 mmol/L (ref 3.5–5.1)
Sodium: 138 mmol/L (ref 135–145)

## 2016-04-26 MED ORDER — ALUM & MAG HYDROXIDE-SIMETH 200-200-20 MG/5ML PO SUSP
30.0000 mL | ORAL | Status: DC | PRN
  Administered 2016-04-26: 30 mL via ORAL
  Filled 2016-04-26: qty 30

## 2016-04-26 NOTE — Progress Notes (Signed)
PROGRESS NOTE        PATIENT DETAILS Name: Anita Perez Age: 81 y.o. Sex: female Date of Birth: September 01, 1935 Admit Date: 04/25/2016 Admitting Physician Truett Mainland, DO [id number], MD  Brief Narrative: Patient is a 81 y.o. female with past medical history of hypertension, dyslipidemia, restless leg syndrome presented to the ED for evaluation of abdominal pain, was found to have small bowel obstruction and admitted for further evaluation and treatment. See below for further details  Subjective: Passed a few flatus-no BM yet. One episode of vomiting earlier this morning but none since then. Continues to have intermittent but mild or abdominal pain.  Assessment/Plan: Small bowel obstruction: Probably due to adhesions from prior surgery. Abdominal exam looks benign-abdomen is nondistended-only one episode of vomiting so far- hold off on placing NG tube for now. Keep nothing by mouth, continue supportive care-have consulted general surgery for further recommendations.  Hypertension: Hold diuretics for now-cautiously continue with amlodipine-if vomiting reoccurs-then will use IV antihypertensives. Follow.  Dyslipidemia: Hold statins-resume with small bowel obstruction resolves.  Peripheral neuropathy/restless leg syndrome: Cautiously continue with Lyrica and Requip-if the vomiting reoccurs-may need to discontinue temporarily.  CT abdomen showed 11 mm low-density lesion in the pancreatic head: Subsequent MRI abdomen did not show any suspicious pancreatic findings,low-density lesion in the pancreatic head on  CT attributed to tortuosity of the main pancreatic duct.  DVT Prophylaxis: Prophylactic Lovenox   Code Status: Full code   Family Communication: None at bedside  Disposition Plan: Remain inpatient-home when SBO resolves in 2-3 days  Antimicrobial agents: Anti-infectives    None      Procedures: None  CONSULTS:  general surgery  Time  spent: 25 minutes-Greater than 50% of this time was spent in counseling, explanation of diagnosis, planning of further management, and coordination of care.  MEDICATIONS: Scheduled Meds: . amLODipine  10 mg Oral Daily  . aspirin  81 mg Oral Daily  . atorvastatin  40 mg Oral q1800  . calcium-vitamin D  1 tablet Oral Daily  . enoxaparin (LOVENOX) injection  40 mg Subcutaneous Daily  . mouth rinse  15 mL Mouth Rinse BID  . mouth rinse  15 mL Mouth Rinse q12n4p  . pregabalin  75 mg Oral Daily  . raloxifene  60 mg Oral Daily  . rOPINIRole  0.5 mg Oral QHS  . [START ON 04/28/2016] Vitamin D (Ergocalciferol)  50,000 Units Oral Once per day on Mon Thu   Continuous Infusions: . 0.9 % NaCl with KCl 20 mEq / L 100 mL/hr at 04/26/16 0854   PRN Meds:.acetaminophen **OR** acetaminophen, alum & mag hydroxide-simeth, hydrALAZINE, morphine injection, ondansetron **OR** ondansetron (ZOFRAN) IV, polyethylene glycol, traZODone   PHYSICAL EXAM: Vital signs: Vitals:   04/25/16 1543 04/25/16 2300 04/26/16 0500 04/26/16 0619  BP: 134/79 (!) 135/58 (!) 171/63 (!) 159/72  Pulse: 73 80 92   Resp:  18 17   Temp: 98.6 F (37 C) 98.3 F (36.8 C) 98.9 F (37.2 C)   TempSrc: Oral Oral    SpO2: 98% 96% 93%   Weight: 70.2 kg (154 lb 12.8 oz)  71.2 kg (156 lb 15.5 oz)   Height: 5\' 3"  (1.6 m)      Filed Weights   04/25/16 0000 04/25/16 1543 04/26/16 0500  Weight: 68 kg (150 lb) 70.2 kg (154 lb 12.8 oz) 71.2 kg (156 lb  15.5 oz)   Body mass index is 27.81 kg/m.   General appearance :Awake, alert, not in any distress. Speech Clear. Not toxic Looking Eyes:, pupils equally reactive to light and accomodation,no scleral icterus.Pink conjunctiva HEENT: Atraumatic and Normocephalic Neck: supple, no JVD. No cervical lymphadenopathy. No thyromegaly Resp:Good air entry bilaterally, no added sounds  CVS: S1 S2 regular, no murmurs.  GI: Bowel sounds present,Mildly tender in the periumbilical area-without any  peritoneal signs.  Extremities: B/L Lower Ext shows no edema, both legs are warm to touch Neurology:  speech clear,Non focal, sensation is grossly intact. Psychiatric: Normal judgment and insight. Alert and oriented x 3. Normal mood. Musculoskeletal:No digital cyanosis Skin:No Rash, warm and dry Wounds:N/A  I have personally reviewed following labs and imaging studies  LABORATORY DATA: CBC:  Recent Labs Lab 04/25/16 0003 04/25/16 1552 04/26/16 0716  WBC 13.6* 13.6* 10.9*  NEUTROABS 11.2*  --   --   HGB 12.4 12.3 11.7*  HCT 39.2 38.8 37.3  MCV 86.0 86.8 87.4  PLT 289 268 [id number]    Basic Metabolic Panel:  Recent Labs Lab 04/25/16 0003 04/25/16 0845 04/25/16 1552 04/26/16 0716  NA 138  --   --  138  K 3.6  --   --  4.3  CL 103  --   --  102  CO2 27  --   --  27  GLUCOSE 135*  --   --  122*  BUN 13  --   --  16  CREATININE 0.80  --  1.02* 1.03*  CALCIUM 9.0  --   --  9.0  MG  --  1.9  --   --     GFR: Estimated Creatinine Clearance: 42.6 mL/min (by C-G formula based on SCr of 1.03 mg/dL (H)).  Liver Function Tests:  Recent Labs Lab 04/25/16 0003  AST 24  ALT 16  ALKPHOS 68  BILITOT 0.6  PROT 7.1  ALBUMIN 3.9    Recent Labs Lab 04/25/16 0855  LIPASE 25  AMYLASE 47   No results for input(s): AMMONIA in the last 168 hours.  Coagulation Profile: No results for input(s): INR, PROTIME in the last 168 hours.  Cardiac Enzymes: No results for input(s): CKTOTAL, CKMB, CKMBINDEX, TROPONINI in the last 168 hours.  BNP (last 3 results) No results for input(s): PROBNP in the last 8760 hours.  HbA1C: No results for input(s): HGBA1C in the last 72 hours.  CBG: No results for input(s): GLUCAP in the last 168 hours.  Lipid Profile: No results for input(s): CHOL, HDL, LDLCALC, TRIG, CHOLHDL, LDLDIRECT in the last 72 hours.  Thyroid Function Tests: No results for input(s): TSH, T4TOTAL, FREET4, T3FREE, THYROIDAB in the last 72 hours.  Anemia Panel: No  results for input(s): VITAMINB12, FOLATE, FERRITIN, TIBC, IRON, RETICCTPCT in the last 72 hours.  Urine analysis:    Component Value Date/Time   COLORURINE YELLOW 04/25/2016 0019   APPEARANCEUR HAZY (A) 04/25/2016 0019   LABSPEC 1.018 04/25/2016 0019   PHURINE 8.0 04/25/2016 0019   GLUCOSEU NEGATIVE 04/25/2016 0019   HGBUR NEGATIVE 04/25/2016 0019   BILIRUBINUR NEGATIVE 04/25/2016 0019   BILIRUBINUR neg 06/29/2014 1436   KETONESUR 5 (A) 04/25/2016 0019   PROTEINUR 30 (A) 04/25/2016 0019   UROBILINOGEN negative 06/29/2014 1436   NITRITE NEGATIVE 04/25/2016 0019   LEUKOCYTESUR NEGATIVE 04/25/2016 0019    Sepsis Labs: Lactic Acid, Venous No results found for: LATICACIDVEN  MICROBIOLOGY: No results found for this or any previous visit (from the  past 240 hour(s)).  RADIOLOGY STUDIES/RESULTS: Mr Abdomen W Wo Contrast  Result Date: 04/25/2016 CLINICAL DATA:  Abdominal bloating. Possible pancreatic mass on CT. History of rectal carcinoid tumor. EXAM: MRI ABDOMEN WITHOUT AND WITH CONTRAST TECHNIQUE: Multiplanar multisequence MR imaging of the abdomen was performed both before and after the administration of intravenous contrast. CONTRAST:  92mL MULTIHANCE GADOBENATE DIMEGLUMINE 529 MG/ML IV SOLN COMPARISON:  04/25/2016 and 12/13/2004 abdominopelvic CT. FINDINGS: Lower chest:  The visualized lower chest appears unremarkable. Hepatobiliary: No suspicious hepatic findings. There are scattered small cysts which are stable. The gallbladder and biliary system appear normal. No evidence of biliary dilatation or choledocholithiasis. Pancreas: No evidence of pancreatic mass, surrounding inflammation or abnormal enhancement. The low-density seen within the pancreatic head is due to mild tortuosity of the main pancreatic duct which is not significantly dilated. Spleen: Normal in size without focal abnormality. Adrenals/Urinary Tract: Both adrenal glands appear normal. Both kidneys appear unremarkable. No  hydronephrosis. Stomach/Bowel: Mildly dilated loops of small bowel are again noted within the left mid abdomen, as seen on earlier CT. There is a small amount of mesenteric fluid. No significant bowel wall thickening seen. Vascular/Lymphatic: There are no enlarged abdominal lymph nodes. Aortic and branch vessel atherosclerosis, better seen on CT. No evidence of large vessel occlusion. Other: None. Musculoskeletal: No acute or significant osseous findings. Lumbar spondylosis noted. IMPRESSION: 1. No suspicious pancreatic findings. The low-density in the pancreatic head on preceding CT attributed to tortuosity of the main pancreatic duct. 2. Persistent mid small bowel partial obstruction pattern. Electronically Signed   By: Richardean Sale M.D.   On: 04/25/2016 12:37   Ct Abdomen Pelvis W Contrast  Result Date: 04/25/2016 CLINICAL DATA:  Mid upper abdominal pain beginning today with nausea. Prior appendectomy. EXAM: CT ABDOMEN AND PELVIS WITH CONTRAST TECHNIQUE: Multidetector CT imaging of the abdomen and pelvis was performed using the standard protocol following bolus administration of intravenous contrast. CONTRAST:  137mL ISOVUE-300 IOPAMIDOL (ISOVUE-300) INJECTION 61% COMPARISON:  12/13/2004 FINDINGS: Lower chest: Subsegmental atelectasis in the lung bases. No pleural effusion. Hepatobiliary: Hypoattenuating/ hypoenhancing liver lesions measures 3 mm in the posterior right hepatic lobe (series 301, image 9), 1.6 cm in the medial segment left hepatic lobe (series 301, image 20), and 10 mm more inferiorly in the right hepatic lobe (series 301, image 22), not significantly changed in size and compatible with a benign etiology. 12 mm low-density lesion in segment II on the prior CT is no longer clearly identified. The gallbladder is unremarkable. There is no biliary dilatation. Pancreas: 11 mm hypoattenuating lesion in the pancreatic head which appears new. No significant pancreatic ductal dilatation or atrophy.  Spleen: Unremarkable. Adrenals/Urinary Tract: Unremarkable adrenal glands. No evidence of renal mass, calculi, or hydronephrosis. Decompressed bladder. Stomach/Bowel: The stomach is within normal limits. There is mild diffuse dilatation of proximal small bowel loops measuring up to approximately 3.2 cm in diameter with fecalized contents in the central abdomen. There is a transition to decompressed small bowel in the midline the central abdomen just below the level of the umbilicus (series [id number], image 47). Scattered surgical clips are noted in this general region. There is prominent mucosal enhancement involving the transitional segment of small bowel at this location, without a discrete mass identified. The remainder of the more distal small bowel is decompressed. The colon is also largely decompressed, containing only a small amount of gas and stool. Colonic diverticulosis is noted without evidence of diverticulitis. Prior appendectomy. Vascular/Lymphatic: Abdominal aortic atherosclerosis without aneurysm. No enlarged  lymph nodes. Reproductive: Uterus and bilateral adnexa are unremarkable. Other: No intraperitoneal free fluid. No abdominal wall mass or hernia. Musculoskeletal: Moderate lumbar disc and facet degeneration. IMPRESSION: 1. Small bowel obstruction with transition point in the central abdomen just below the umbilicus, possibly secondary to adhesions. 2. **An incidental finding of potential clinical significance has been found. 11 mm low-density lesion in the pancreatic head, new from 2006 and indeterminate. Recommend follow-up contrast-enhanced abdominal MRI or pancreatic protocol CT in 2 years.** Study reviewed in person with Dr. Roxanne Mins on [id number] at 7:37 a.m. Electronically Signed   By: Logan Bores M.D.   On: 04/25/2016 08:12   Mr 3d Recon At Scanner  Result Date: 04/25/2016 CLINICAL DATA:  Abdominal bloating. Possible pancreatic mass on CT. History of rectal carcinoid tumor. EXAM: MRI ABDOMEN  WITHOUT AND WITH CONTRAST TECHNIQUE: Multiplanar multisequence MR imaging of the abdomen was performed both before and after the administration of intravenous contrast. CONTRAST:  63mL MULTIHANCE GADOBENATE DIMEGLUMINE 529 MG/ML IV SOLN COMPARISON:  04/25/2016 and 12/13/2004 abdominopelvic CT. FINDINGS: Lower chest:  The visualized lower chest appears unremarkable. Hepatobiliary: No suspicious hepatic findings. There are scattered small cysts which are stable. The gallbladder and biliary system appear normal. No evidence of biliary dilatation or choledocholithiasis. Pancreas: No evidence of pancreatic mass, surrounding inflammation or abnormal enhancement. The low-density seen within the pancreatic head is due to mild tortuosity of the main pancreatic duct which is not significantly dilated. Spleen: Normal in size without focal abnormality. Adrenals/Urinary Tract: Both adrenal glands appear normal. Both kidneys appear unremarkable. No hydronephrosis. Stomach/Bowel: Mildly dilated loops of small bowel are again noted within the left mid abdomen, as seen on earlier CT. There is a small amount of mesenteric fluid. No significant bowel wall thickening seen. Vascular/Lymphatic: There are no enlarged abdominal lymph nodes. Aortic and branch vessel atherosclerosis, better seen on CT. No evidence of large vessel occlusion. Other: None. Musculoskeletal: No acute or significant osseous findings. Lumbar spondylosis noted. IMPRESSION: 1. No suspicious pancreatic findings. The low-density in the pancreatic head on preceding CT attributed to tortuosity of the main pancreatic duct. 2. Persistent mid small bowel partial obstruction pattern. Electronically Signed   By: Richardean Sale M.D.   On: 04/25/2016 12:37   Dg Abd 2 Views  Result Date: 04/26/2016 CLINICAL DATA:  Re-evaluate small bowel obstruction. EXAM: ABDOMEN - 2 VIEW COMPARISON:  CT, 04/25/2016 FINDINGS: There are mildly dilated loops small bowel in the central  abdomen, with scattered air-fluid levels on the erect view. Air is seen within a normal caliber colon and in the rectum. This is consistent with a partial small bowel obstruction, without significant change from the prior CT. No free air. Surgical vascular clips are noted across the central abdomen and in the pelvis. IMPRESSION: 1. Persistent partial small bowel obstruction without significant change from the recent prior CT scan. No free air. Electronically Signed   By: Lajean Manes M.D.   On: 04/26/2016 10:10     LOS: 0 days   Oren Binet, MD  [facility] Pager:[phone]  If 7PM-7AM, please contact night-coverage [email or web address] Password [email or web address] 04/26/2016, 11:04 AM
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
## 2016-04-27 ENCOUNTER — Inpatient Hospital Stay (HOSPITAL_COMMUNITY): Payer: Medicare Other

## 2016-04-27 DIAGNOSIS — G629 Polyneuropathy, unspecified: Secondary | ICD-10-CM

## 2016-04-27 LAB — BASIC METABOLIC PANEL
Anion gap: 11 (ref 5–15)
BUN: 24 mg/dL — AB (ref 6–20)
CO2: 27 mmol/L (ref 22–32)
CREATININE: 1.03 mg/dL — AB (ref 0.44–1.00)
Calcium: 8.7 mg/dL — ABNORMAL LOW (ref 8.9–10.3)
Chloride: 101 mmol/L (ref 101–111)
GFR calc Af Amer: 59 mL/min — ABNORMAL LOW (ref >60)
GFR, EST NON AFRICAN AMERICAN: 51 mL/min — AB (ref >60)
Glucose, Bld: 100 mg/dL — ABNORMAL HIGH (ref 65–99)
POTASSIUM: 4 mmol/L (ref 3.5–5.1)
SODIUM: 139 mmol/L (ref 135–145)

## 2016-04-27 NOTE — Progress Notes (Signed)
Subjective: Stable and alert.  No stool or flatus Feels much better, however, as pain has essentially resolved. NG output 1050 mL.  Abdominal x-ray shows no evidence of bowel obstruction but NG tube is pushed down to far and NG is cold.  We'll pull back. Potassium 4.0.  Creatinine 1.03. Objective: Vital signs in last 24 hours: Temp:  [98.4 F (36.9 C)-99.3 F (37.4 C)] 99.3 F (37.4 C) (01/28 0536) Pulse Rate:  [85-92] 92 (01/28 0536) Resp:  [17-18] 17 (01/28 0536) BP: (146-151)/(55-60) 150/55 (01/28 0536) SpO2:  [92 %-97 %] 92 % (01/28 0536) Weight:  [71.6 kg (157 lb 12.8 oz)] 71.6 kg (157 lb 12.8 oz) (01/28 0447) Last BM Date: 04/24/16  Intake/Output from previous day: 01/27 0701 - 01/28 0700 In: 2220 [P.O.:120; I.V.:2100] Out: 1290 [Urine:200; Emesis/NG output:1090] Intake/Output this shift: No intake/output data recorded.  General appearance: Alert and cooperative.  No distress. Resp: clear to auscultation bilaterally GI: Soft.  Not really distended or tender.  Rare bowel sounds.  Wounds healed without obvious hernias or mass. Extremities: no edema, redness or tenderness in the calves or thighs  Lab Results:   Recent Labs  04/25/16 1552 04/26/16 0716  WBC 13.6* 10.9*  HGB 12.3 11.7*  HCT 38.8 37.3  PLT 268 264   BMET  Recent Labs  04/26/16 0716 04/27/16 0528  NA 138 139  K 4.3 4.0  CL 102 101  CO2 27 27  GLUCOSE 122* 100*  BUN 16 24*  CREATININE 1.03* 1.03*  CALCIUM 9.0 8.7*   PT/INR No results for input(s): LABPROT, INR in the last 72 hours. ABG No results for input(s): PHART, HCO3 in the last 72 hours.  Invalid input(s): PCO2, PO2  Studies/Results: Mr Abdomen W Wo Contrast  Result Date: 04/25/2016 CLINICAL DATA:  Abdominal bloating. Possible pancreatic mass on CT. History of rectal carcinoid tumor. EXAM: MRI ABDOMEN WITHOUT AND WITH CONTRAST TECHNIQUE: Multiplanar multisequence MR imaging of the abdomen was performed both before and after  the administration of intravenous contrast. CONTRAST:  17mL MULTIHANCE GADOBENATE DIMEGLUMINE 529 MG/ML IV SOLN COMPARISON:  04/25/2016 and 12/13/2004 abdominopelvic CT. FINDINGS: Lower chest:  The visualized lower chest appears unremarkable. Hepatobiliary: No suspicious hepatic findings. There are scattered small cysts which are stable. The gallbladder and biliary system appear normal. No evidence of biliary dilatation or choledocholithiasis. Pancreas: No evidence of pancreatic mass, surrounding inflammation or abnormal enhancement. The low-density seen within the pancreatic head is due to mild tortuosity of the main pancreatic duct which is not significantly dilated. Spleen: Normal in size without focal abnormality. Adrenals/Urinary Tract: Both adrenal glands appear normal. Both kidneys appear unremarkable. No hydronephrosis. Stomach/Bowel: Mildly dilated loops of small bowel are again noted within the left mid abdomen, as seen on earlier CT. There is a small amount of mesenteric fluid. No significant bowel wall thickening seen. Vascular/Lymphatic: There are no enlarged abdominal lymph nodes. Aortic and branch vessel atherosclerosis, better seen on CT. No evidence of large vessel occlusion. Other: None. Musculoskeletal: No acute or significant osseous findings. Lumbar spondylosis noted. IMPRESSION: 1. No suspicious pancreatic findings. The low-density in the pancreatic head on preceding CT attributed to tortuosity of the main pancreatic duct. 2. Persistent mid small bowel partial obstruction pattern. Electronically Signed   By: Richardean Sale M.D.   On: 04/25/2016 12:37   Mr 3d Recon At Scanner  Result Date: 04/25/2016 CLINICAL DATA:  Abdominal bloating. Possible pancreatic mass on CT. History of rectal carcinoid tumor. EXAM: MRI ABDOMEN WITHOUT AND WITH  CONTRAST TECHNIQUE: Multiplanar multisequence MR imaging of the abdomen was performed both before and after the administration of intravenous contrast.  CONTRAST:  56mL MULTIHANCE GADOBENATE DIMEGLUMINE 529 MG/ML IV SOLN COMPARISON:  04/25/2016 and 12/13/2004 abdominopelvic CT. FINDINGS: Lower chest:  The visualized lower chest appears unremarkable. Hepatobiliary: No suspicious hepatic findings. There are scattered small cysts which are stable. The gallbladder and biliary system appear normal. No evidence of biliary dilatation or choledocholithiasis. Pancreas: No evidence of pancreatic mass, surrounding inflammation or abnormal enhancement. The low-density seen within the pancreatic head is due to mild tortuosity of the main pancreatic duct which is not significantly dilated. Spleen: Normal in size without focal abnormality. Adrenals/Urinary Tract: Both adrenal glands appear normal. Both kidneys appear unremarkable. No hydronephrosis. Stomach/Bowel: Mildly dilated loops of small bowel are again noted within the left mid abdomen, as seen on earlier CT. There is a small amount of mesenteric fluid. No significant bowel wall thickening seen. Vascular/Lymphatic: There are no enlarged abdominal lymph nodes. Aortic and branch vessel atherosclerosis, better seen on CT. No evidence of large vessel occlusion. Other: None. Musculoskeletal: No acute or significant osseous findings. Lumbar spondylosis noted. IMPRESSION: 1. No suspicious pancreatic findings. The low-density in the pancreatic head on preceding CT attributed to tortuosity of the main pancreatic duct. 2. Persistent mid small bowel partial obstruction pattern. Electronically Signed   By: Richardean Sale M.D.   On: 04/25/2016 12:37   Dg Abd 2 Views  Result Date: 04/26/2016 CLINICAL DATA:  Re-evaluate small bowel obstruction. EXAM: ABDOMEN - 2 VIEW COMPARISON:  CT, 04/25/2016 FINDINGS: There are mildly dilated loops small bowel in the central abdomen, with scattered air-fluid levels on the erect view. Air is seen within a normal caliber colon and in the rectum. This is consistent with a partial small bowel  obstruction, without significant change from the prior CT. No free air. Surgical vascular clips are noted across the central abdomen and in the pelvis. IMPRESSION: 1. Persistent partial small bowel obstruction without significant change from the recent prior CT scan. No free air. Electronically Signed   By: Lajean Manes M.D.   On: 04/26/2016 10:10   Dg Abd Portable 1v  Result Date: 04/26/2016 CLINICAL DATA:  Followup small bowel obstruction EXAM: PORTABLE ABDOMEN - 1 VIEW COMPARISON:  04/26/2016 FINDINGS: Scattered large and small bowel gas is noted. Mild prominence of the small bowel is again identified stable from the prior exam. No free air is seen. Nasogastric catheter is noted coiled within the stomach. Degenerative changes of lumbar spine are again noted. IMPRESSION: Stable small bowel prominence. Nasogastric catheter coiled within the stomach. Electronically Signed   By: Inez Catalina M.D.   On: 04/26/2016 18:06   Dg Abd Portable 2v  Result Date: 04/27/2016 CLINICAL DATA:  Small bowel obstruction EXAM: PORTABLE ABDOMEN - 2 VIEW COMPARISON:  04/26/2016 FINDINGS: NG tube coils in the stomach. Nonobstructive bowel gas pattern. No free air organomegaly. IMPRESSION: No current evidence of bowel obstruction. NG tube remains coiled in the stomach. Electronically Signed   By: Rolm Baptise M.D.   On: 04/27/2016 07:51    Anti-infectives: Anti-infectives    None      Assessment/Plan:   Partial SBO.  Radiographically and clinically improved but hasn't opened up yet. No evidence of compromised bowel Leave NG tube and another day.  Reposition NG tube. Ambulatein hall Lab and x-ray tomorrow.  History appendectomy and two-stage colon resection for diverticulitis.  CT incidental finding of 11 mm low-density lesion in pancreatic head,  not seen in 2006, indeterminate.  Follow-up imaging recommended.   LOS: 1 day    Adin Hector 04/27/2016

## 2016-04-27 NOTE — Progress Notes (Signed)
PROGRESS NOTE        PATIENT DETAILS Name: Anita Perez Age: 81 y.o. Sex: female Date of Birth: [birth year] Admit Date: 04/25/2016 Admitting Physician Truett Mainland, DO YD:[id number], MD  Brief Narrative: Patient is a 81 y.o. female with past medical history of hypertension, dyslipidemia, restless leg syndrome presented to the ED for evaluation of abdominal pain, was found to have small bowel obstruction and admitted for further evaluation and treatment. See below for further details  Subjective: No BM or flatus yet. NGT inserted yesterday. Overall feels better  Assessment/Plan: Small bowel obstruction: Probably due to adhesions from prior surgery. Although readiographically improved-no BM/Flatus yet.Hopeful for recovery with conservative management. Continue NPO/NGT/IVF and other supportive care. Gen surgery following.   Hypertension: Hold diuretics and amlodipine-as NPO and NGT in place-BP relatively well controlled with prn IV hydralazine. Follow trend-resume oral antihypertensives when able.    Dyslipidemia: Hold statins-resume with small bowel obstruction resolves.  Peripheral neuropathy/restless leg syndrome:Hold Lyrica and Requip-as NPO with NGT in place. Resume when able  CT abdomen showed 11 mm low-density lesion in the pancreatic head: Subsequent MRI abdomen did not show any suspicious pancreatic findings,low-density lesion in the pancreatic head on  CT attributed to tortuosity of the main pancreatic duct.  DVT Prophylaxis: Prophylactic Lovenox   Code Status: Full code   Family Communication: None at bedside  Disposition Plan: Remain inpatient-home when SBO resolves in 2-3 days  Antimicrobial agents: Anti-infectives    None      Procedures: None  CONSULTS:  general surgery  Time spent: 25 minutes-Greater than 50% of this time was spent in counseling, explanation of diagnosis, planning of further management, and  coordination of care.  MEDICATIONS: Scheduled Meds: . amLODipine  10 mg Oral Daily  . aspirin  81 mg Oral Daily  . calcium-vitamin D  1 tablet Oral Daily  . enoxaparin (LOVENOX) injection  40 mg Subcutaneous Daily  . mouth rinse  15 mL Mouth Rinse BID  . mouth rinse  15 mL Mouth Rinse q12n4p  . pregabalin  75 mg Oral Daily  . rOPINIRole  0.5 mg Oral QHS  . [START ON 04/28/2016] Vitamin D (Ergocalciferol)  50,000 Units Oral Once per day on Mon Thu   Continuous Infusions: . 0.9 % NaCl with KCl 20 mEq / L 1 mL (04/27/16 0444)   PRN Meds:.acetaminophen **OR** acetaminophen, alum & mag hydroxide-simeth, hydrALAZINE, morphine injection, ondansetron **OR** ondansetron (ZOFRAN) IV, polyethylene glycol, traZODone   PHYSICAL EXAM: Vital signs: Vitals:   04/26/16 1356 04/26/16 2141 04/27/16 0447 04/27/16 0536  BP: (!) 146/57 (!) 151/60  (!) 150/55  Pulse: 86 85  92  Resp:  18  17  Temp: 98.9 F (37.2 C) 98.4 F (36.9 C)  99.3 F (37.4 C)  TempSrc: Oral Oral  Oral  SpO2: 97% 95%  92%  Weight:   71.6 kg (157 lb 12.8 oz)   Height:       Filed Weights   04/25/16 1543 04/26/16 0500 04/27/16 0447  Weight: 70.2 kg (154 lb 12.8 oz) 71.2 kg (156 lb 15.5 oz) 71.6 kg (157 lb 12.8 oz)   Body mass index is 27.95 kg/m.   General appearance :Awake, alert, not in any distress. Speech Clear. Not toxic Looking. NGT in place Eyes:, pupils equally reactive to light and accomodation,no scleral icterus.Pink conjunctiva HEENT: Atraumatic and Normocephalic  Neck: supple, no JVD. No cervical lymphadenopathy. No thyromegaly Resp:Good air entry bilaterally, no added sounds  CVS: S1 S2 regular, no murmurs.  GI: Bowel sounds present,Mildly tender in the periumbilical area-without any peritoneal signs.  Extremities: B/L Lower Ext shows no edema, both legs are warm to touch Neurology:  speech clear,Non focal, sensation is grossly intact. Psychiatric: Normal judgment and insight. Alert and oriented x 3.  Normal mood. Musculoskeletal:No digital cyanosis Skin:No Rash, warm and dry Wounds:N/A  I have personally reviewed following labs and imaging studies  LABORATORY DATA: CBC:  Recent Labs Lab 04/25/16 0003 04/25/16 1552 04/26/16 0716  WBC 13.6* 13.6* 10.9*  NEUTROABS 11.2*  --   --   HGB 12.4 12.3 11.7*  HCT 39.2 38.8 37.3  MCV 86.0 86.8 87.4  PLT 289 268 [id number]    Basic Metabolic Panel:  Recent Labs Lab 04/25/16 0003 04/25/16 0845 04/25/16 1552 04/26/16 0716 04/27/16 0528  NA 138  --   --  138 139  K 3.6  --   --  4.3 4.0  CL 103  --   --  102 101  CO2 27  --   --  27 27  GLUCOSE 135*  --   --  122* 100*  BUN 13  --   --  16 24*  CREATININE 0.80  --  1.02* 1.03* 1.03*  CALCIUM 9.0  --   --  9.0 8.7*  MG  --  1.9  --   --   --     GFR: Estimated Creatinine Clearance: 42.7 mL/min (by C-G formula based on SCr of 1.03 mg/dL (H)).  Liver Function Tests:  Recent Labs Lab 04/25/16 0003  AST 24  ALT 16  ALKPHOS 68  BILITOT 0.6  PROT 7.1  ALBUMIN 3.9    Recent Labs Lab 04/25/16 0855  LIPASE 25  AMYLASE 47   No results for input(s): AMMONIA in the last 168 hours.  Coagulation Profile: No results for input(s): INR, PROTIME in the last 168 hours.  Cardiac Enzymes: No results for input(s): CKTOTAL, CKMB, CKMBINDEX, TROPONINI in the last 168 hours.  BNP (last 3 results) No results for input(s): PROBNP in the last 8760 hours.  HbA1C: No results for input(s): HGBA1C in the last 72 hours.  CBG: No results for input(s): GLUCAP in the last 168 hours.  Lipid Profile: No results for input(s): CHOL, HDL, LDLCALC, TRIG, CHOLHDL, LDLDIRECT in the last 72 hours.  Thyroid Function Tests: No results for input(s): TSH, T4TOTAL, FREET4, T3FREE, THYROIDAB in the last 72 hours.  Anemia Panel: No results for input(s): VITAMINB12, FOLATE, FERRITIN, TIBC, IRON, RETICCTPCT in the last 72 hours.  Urine analysis:    Component Value Date/Time   COLORURINE YELLOW  04/25/2016 0019   APPEARANCEUR HAZY (A) 04/25/2016 0019   LABSPEC 1.018 04/25/2016 0019   PHURINE 8.0 04/25/2016 0019   GLUCOSEU NEGATIVE 04/25/2016 0019   HGBUR NEGATIVE 04/25/2016 0019   BILIRUBINUR NEGATIVE 04/25/2016 0019   BILIRUBINUR neg 06/29/2014 1436   KETONESUR 5 (A) 04/25/2016 0019   PROTEINUR 30 (A) 04/25/2016 0019   UROBILINOGEN negative 06/29/2014 1436   NITRITE NEGATIVE 04/25/2016 0019   LEUKOCYTESUR NEGATIVE 04/25/2016 0019    Sepsis Labs: Lactic Acid, Venous No results found for: LATICACIDVEN  MICROBIOLOGY: No results found for this or any previous visit (from the past 240 hour(s)).  RADIOLOGY STUDIES/RESULTS: Mr Abdomen W Wo Contrast  Result Date: 04/25/2016 CLINICAL DATA:  Abdominal bloating. Possible pancreatic mass on CT. History of  rectal carcinoid tumor. EXAM: MRI ABDOMEN WITHOUT AND WITH CONTRAST TECHNIQUE: Multiplanar multisequence MR imaging of the abdomen was performed both before and after the administration of intravenous contrast. CONTRAST:  4mL MULTIHANCE GADOBENATE DIMEGLUMINE 529 MG/ML IV SOLN COMPARISON:  04/25/2016 and 12/13/2004 abdominopelvic CT. FINDINGS: Lower chest:  The visualized lower chest appears unremarkable. Hepatobiliary: No suspicious hepatic findings. There are scattered small cysts which are stable. The gallbladder and biliary system appear normal. No evidence of biliary dilatation or choledocholithiasis. Pancreas: No evidence of pancreatic mass, surrounding inflammation or abnormal enhancement. The low-density seen within the pancreatic head is due to mild tortuosity of the main pancreatic duct which is not significantly dilated. Spleen: Normal in size without focal abnormality. Adrenals/Urinary Tract: Both adrenal glands appear normal. Both kidneys appear unremarkable. No hydronephrosis. Stomach/Bowel: Mildly dilated loops of small bowel are again noted within the left mid abdomen, as seen on earlier CT. There is a small amount of  mesenteric fluid. No significant bowel wall thickening seen. Vascular/Lymphatic: There are no enlarged abdominal lymph nodes. Aortic and branch vessel atherosclerosis, better seen on CT. No evidence of large vessel occlusion. Other: None. Musculoskeletal: No acute or significant osseous findings. Lumbar spondylosis noted. IMPRESSION: 1. No suspicious pancreatic findings. The low-density in the pancreatic head on preceding CT attributed to tortuosity of the main pancreatic duct. 2. Persistent mid small bowel partial obstruction pattern. Electronically Signed   By: Richardean Sale M.D.   On: 04/25/2016 12:37   Ct Abdomen Pelvis W Contrast  Result Date: 04/25/2016 CLINICAL DATA:  Mid upper abdominal pain beginning today with nausea. Prior appendectomy. EXAM: CT ABDOMEN AND PELVIS WITH CONTRAST TECHNIQUE: Multidetector CT imaging of the abdomen and pelvis was performed using the standard protocol following bolus administration of intravenous contrast. CONTRAST:  179mL ISOVUE-300 IOPAMIDOL (ISOVUE-300) INJECTION 61% COMPARISON:  12/13/2004 FINDINGS: Lower chest: Subsegmental atelectasis in the lung bases. No pleural effusion. Hepatobiliary: Hypoattenuating/ hypoenhancing liver lesions measures 3 mm in the posterior right hepatic lobe (series 301, image 9), 1.6 cm in the medial segment left hepatic lobe (series 301, image 20), and 10 mm more inferiorly in the right hepatic lobe (series 301, image 22), not significantly changed in size and compatible with a benign etiology. 12 mm low-density lesion in segment II on the prior CT is no longer clearly identified. The gallbladder is unremarkable. There is no biliary dilatation. Pancreas: 11 mm hypoattenuating lesion in the pancreatic head which appears new. No significant pancreatic ductal dilatation or atrophy. Spleen: Unremarkable. Adrenals/Urinary Tract: Unremarkable adrenal glands. No evidence of renal mass, calculi, or hydronephrosis. Decompressed bladder.  Stomach/Bowel: The stomach is within normal limits. There is mild diffuse dilatation of proximal small bowel loops measuring up to approximately 3.2 cm in diameter with fecalized contents in the central abdomen. There is a transition to decompressed small bowel in the midline the central abdomen just below the level of the umbilicus (series [id number], image 47). Scattered surgical clips are noted in this general region. There is prominent mucosal enhancement involving the transitional segment of small bowel at this location, without a discrete mass identified. The remainder of the more distal small bowel is decompressed. The colon is also largely decompressed, containing only a small amount of gas and stool. Colonic diverticulosis is noted without evidence of diverticulitis. Prior appendectomy. Vascular/Lymphatic: Abdominal aortic atherosclerosis without aneurysm. No enlarged lymph nodes. Reproductive: Uterus and bilateral adnexa are unremarkable. Other: No intraperitoneal free fluid. No abdominal wall mass or hernia. Musculoskeletal: Moderate lumbar disc and facet degeneration.  IMPRESSION: 1. Small bowel obstruction with transition point in the central abdomen just below the umbilicus, possibly secondary to adhesions. 2. **An incidental finding of potential clinical significance has been found. 11 mm low-density lesion in the pancreatic head, new from 2006 and indeterminate. Recommend follow-up contrast-enhanced abdominal MRI or pancreatic protocol CT in 2 years.** Study reviewed in person with Dr. Roxanne Mins on Q000111Q at 7:37 a.m. Electronically Signed   By: Logan Bores M.D.   On: 04/25/2016 08:12   Mr 3d Recon At Scanner  Result Date: 04/25/2016 CLINICAL DATA:  Abdominal bloating. Possible pancreatic mass on CT. History of rectal carcinoid tumor. EXAM: MRI ABDOMEN WITHOUT AND WITH CONTRAST TECHNIQUE: Multiplanar multisequence MR imaging of the abdomen was performed both before and after the administration of  intravenous contrast. CONTRAST:  49mL MULTIHANCE GADOBENATE DIMEGLUMINE 529 MG/ML IV SOLN COMPARISON:  04/25/2016 and 12/13/2004 abdominopelvic CT. FINDINGS: Lower chest:  The visualized lower chest appears unremarkable. Hepatobiliary: No suspicious hepatic findings. There are scattered small cysts which are stable. The gallbladder and biliary system appear normal. No evidence of biliary dilatation or choledocholithiasis. Pancreas: No evidence of pancreatic mass, surrounding inflammation or abnormal enhancement. The low-density seen within the pancreatic head is due to mild tortuosity of the main pancreatic duct which is not significantly dilated. Spleen: Normal in size without focal abnormality. Adrenals/Urinary Tract: Both adrenal glands appear normal. Both kidneys appear unremarkable. No hydronephrosis. Stomach/Bowel: Mildly dilated loops of small bowel are again noted within the left mid abdomen, as seen on earlier CT. There is a small amount of mesenteric fluid. No significant bowel wall thickening seen. Vascular/Lymphatic: There are no enlarged abdominal lymph nodes. Aortic and branch vessel atherosclerosis, better seen on CT. No evidence of large vessel occlusion. Other: None. Musculoskeletal: No acute or significant osseous findings. Lumbar spondylosis noted. IMPRESSION: 1. No suspicious pancreatic findings. The low-density in the pancreatic head on preceding CT attributed to tortuosity of the main pancreatic duct. 2. Persistent mid small bowel partial obstruction pattern. Electronically Signed   By: Richardean Sale M.D.   On: 04/25/2016 12:37   Dg Abd 2 Views  Result Date: 04/26/2016 CLINICAL DATA:  Re-evaluate small bowel obstruction. EXAM: ABDOMEN - 2 VIEW COMPARISON:  CT, 04/25/2016 FINDINGS: There are mildly dilated loops small bowel in the central abdomen, with scattered air-fluid levels on the erect view. Air is seen within a normal caliber colon and in the rectum. This is consistent with a  partial small bowel obstruction, without significant change from the prior CT. No free air. Surgical vascular clips are noted across the central abdomen and in the pelvis. IMPRESSION: 1. Persistent partial small bowel obstruction without significant change from the recent prior CT scan. No free air. Electronically Signed   By: Lajean Manes M.D.   On: 04/26/2016 10:10   Dg Abd Portable 1v  Result Date: 04/26/2016 CLINICAL DATA:  Followup small bowel obstruction EXAM: PORTABLE ABDOMEN - 1 VIEW COMPARISON:  04/26/2016 FINDINGS: Scattered large and small bowel gas is noted. Mild prominence of the small bowel is again identified stable from the prior exam. No free air is seen. Nasogastric catheter is noted coiled within the stomach. Degenerative changes of lumbar spine are again noted. IMPRESSION: Stable small bowel prominence. Nasogastric catheter coiled within the stomach. Electronically Signed   By: Inez Catalina M.D.   On: 04/26/2016 18:06   Dg Abd Portable 2v  Result Date: 04/27/2016 CLINICAL DATA:  Small bowel obstruction EXAM: PORTABLE ABDOMEN - 2 VIEW COMPARISON:  04/26/2016  FINDINGS: NG tube coils in the stomach. Nonobstructive bowel gas pattern. No free air organomegaly. IMPRESSION: No current evidence of bowel obstruction. NG tube remains coiled in the stomach. Electronically Signed   By: Rolm Baptise M.D.   On: 04/27/2016 07:51     LOS: 1 day   Oren Binet, MD  [facility] Pager:[phone]  If 7PM-7AM, please contact night-coverage [email or web address] Password [facility] 04/27/2016, 9:34 AM

## 2016-04-28 ENCOUNTER — Inpatient Hospital Stay (HOSPITAL_COMMUNITY): Payer: Medicare Other

## 2016-04-28 LAB — BASIC METABOLIC PANEL
Anion gap: 7 (ref 5–15)
BUN: 21 mg/dL — AB (ref 6–20)
CALCIUM: 8 mg/dL — AB (ref 8.9–10.3)
CO2: 25 mmol/L (ref 22–32)
CREATININE: 0.81 mg/dL (ref 0.44–1.00)
Chloride: 107 mmol/L (ref 101–111)
GFR calc Af Amer: >60 mL/min (ref >60)
GLUCOSE: 62 mg/dL — AB (ref 65–99)
Potassium: 4.1 mmol/L (ref 3.5–5.1)
SODIUM: 139 mmol/L (ref 135–145)

## 2016-04-28 LAB — CBC
HCT: 32.8 % — ABNORMAL LOW (ref 36.0–46.0)
Hemoglobin: 9.9 g/dL — ABNORMAL LOW (ref 12.0–15.0)
MCH: 26.6 pg (ref 26.0–34.0)
MCHC: 30.2 g/dL (ref 30.0–36.0)
MCV: 88.2 fL (ref 78.0–100.0)
PLATELETS: 219 10*3/uL (ref 150–400)
RBC: 3.72 MIL/uL — ABNORMAL LOW (ref 3.87–5.11)
RDW: 14.6 % (ref 11.5–15.5)
WBC: 5.6 10*3/uL (ref 4.0–10.5)

## 2016-04-28 LAB — GLUCOSE, CAPILLARY: GLUCOSE-CAPILLARY: 63 mg/dL — AB (ref 65–99)

## 2016-04-28 MED ORDER — POTASSIUM CHLORIDE 2 MEQ/ML IV SOLN
INTRAVENOUS | Status: DC
  Administered 2016-04-28 – 2016-04-29 (×3): via INTRAVENOUS
  Filled 2016-04-28 (×5): qty 1000

## 2016-04-28 NOTE — Progress Notes (Signed)
Patient blood sugar trending down to 63. She is NPO. MD notified. See new orders. Pt does not appear to be in any acute distress. Will continue to assess

## 2016-04-28 NOTE — Progress Notes (Signed)
PROGRESS NOTE        PATIENT DETAILS Name: Anita Perez Age: 81 y.o. Sex: female Date of Birth: Mar 04, 1936 Admit Date: 04/25/2016 Admitting Physician Truett Mainland, DO YD:[id number], MD  Brief Narrative: Patient is a 81 y.o. female with PMH significant for HTN, dyslipidemia, restless leg syndrome. Presented to ED for abdominal pain, was found to have SBO and admitted for further evaluation and treatment. See below for further details.   Subjective: Patient had 2 BM yesterday, and denies abdominal pain at this time. Feels much better overall.    Assessment/Plan:  SBO (small bowel obstruction): Due to adhesions from prior surgery. Patient had 2 BM yesterday, and denies pain. Clamp NG tube today and advance to thin clear liquid diet. Continue supportive care. General surgery following.    Hypertension: Compensated, continue hydralazine IV prn. Diuretics and amlodipine held originally due to NGT and NPO status. Clamping NGT today, and starting thin clear liquid diet. Follow diet advancement - resume oral antihypertensives when able.    Hyperlipidemia: Hold statins - follow diet advancement and toleration. Resume oral statins when able.   Peripheral neuropathy / restless leg syndrome: Hold Lyrica and Requip. Resume when diet advancement is tolerated.  CT abdomen showed 11 mm low-denisty lesion in the pancreatic head: Subsequent MRI abdomen did not show any suspicious pancreatic findings,low-density lesion in the pancreatic head on  CT attributed to tortuosity of the main pancreatic duct.  DVT Prophylaxis: Prophylactic Lovenox  Code Status: Full code  Family Communication: Son at the bedside   Disposition Plan: Remain inpatient  Antimicrobial agents: Anti-infectives    None      Procedures: None at this time   CONSULTS: None at this time  Time spent: 25 minutes-Greater than 50% of this time was spent in counseling, explanation of  diagnosis, planning of further management, and coordination of care.  MEDICATIONS: Scheduled Meds: . aspirin  81 mg Oral Daily  . calcium-vitamin D  1 tablet Oral Daily  . enoxaparin (LOVENOX) injection  40 mg Subcutaneous Daily  . mouth rinse  15 mL Mouth Rinse BID  . mouth rinse  15 mL Mouth Rinse q12n4p  . Vitamin D (Ergocalciferol)  50,000 Units Oral Once per day on Mon Thu   Continuous Infusions: . dextrose 5 % and 0.9% NaCl 1,000 mL with potassium chloride 20 mEq infusion     PRN Meds:.acetaminophen **OR** acetaminophen, alum & mag hydroxide-simeth, hydrALAZINE, morphine injection, ondansetron **OR** ondansetron (ZOFRAN) IV, polyethylene glycol, traZODone   PHYSICAL EXAM: Vital signs: Vitals:   04/27/16 0447 04/27/16 0536 04/27/16 1258 04/28/16 0555  BP:  (!) 150/55 (!) 154/61 (!) 164/68  Pulse:  92 81 84  Resp:  17  17  Temp:  99.3 F (37.4 C) 98.2 F (36.8 C) 98.4 F (36.9 C)  TempSrc:  Oral Oral Oral  SpO2:  92% 91% 97%  Weight: 71.6 kg (157 lb 12.8 oz)   70.3 kg (154 lb 14.4 oz)  Height:       Filed Weights   04/26/16 0500 04/27/16 0447 04/28/16 0555  Weight: 71.2 kg (156 lb 15.5 oz) 71.6 kg (157 lb 12.8 oz) 70.3 kg (154 lb 14.4 oz)   Body mass index is 27.44 kg/m.   General appearance: Awake, alert, not in any distress. Speech Clear. Not toxic Looking Eyes: PERRLA, no scleral icterus. Pink conjunctiva HEENT:  Atraumatic and Normocephalic Neck: Supple, no JVD. No cervical lymphadenopathy. No thyromegaly Resp:Good air entry bilaterally, CTAB.   CVS: S1 S2 regular, no murmurs.  GI: Bowel sounds present, Non tender and not distended with no gaurding, rigidity or rebound.No organomegaly Extremities: B/L Lower Ext shows no edema, both legs are warm to touch Neurology:  Speech clear,Non focal, sensation is grossly intact. Psychiatric: Normal judgment and insight. AAO x 3. Normal mood. Musculoskeletal: No digital cyanosis Skin: No Rash, warm and dry  I have  personally reviewed following labs and imaging studies  LABORATORY DATA: CBC:  Recent Labs Lab 04/25/16 0003 04/25/16 1552 04/26/16 0716 04/28/16 0527  WBC 13.6* 13.6* 10.9* 5.6  NEUTROABS 11.2*  --   --   --   HGB 12.4 12.3 11.7* 9.9*  HCT 39.2 38.8 37.3 32.8*  MCV 86.0 86.8 87.4 88.2  PLT 289 268 264 [id number]    Basic Metabolic Panel:  Recent Labs Lab 04/25/16 0003 04/25/16 0845 04/25/16 1552 04/26/16 0716 04/27/16 0528 04/28/16 0527  NA 138  --   --  138 139 139  K 3.6  --   --  4.3 4.0 4.1  CL 103  --   --  102 101 107  CO2 27  --   --  27 27 25   GLUCOSE 135*  --   --  122* 100* 62*  BUN 13  --   --  16 24* 21*  CREATININE 0.80  --  1.02* 1.03* 1.03* 0.81  CALCIUM 9.0  --   --  9.0 8.7* 8.0*  MG  --  1.9  --   --   --   --     GFR: Estimated Creatinine Clearance: 53.9 mL/min (by C-G formula based on SCr of 0.81 mg/dL).  Liver Function Tests:  Recent Labs Lab 04/25/16 0003  AST 24  ALT 16  ALKPHOS 68  BILITOT 0.6  PROT 7.1  ALBUMIN 3.9    Recent Labs Lab 04/25/16 0855  LIPASE 25  AMYLASE 47   No results for input(s): AMMONIA in the last 168 hours.  Coagulation Profile: No results for input(s): INR, PROTIME in the last 168 hours.  Cardiac Enzymes: No results for input(s): CKTOTAL, CKMB, CKMBINDEX, TROPONINI in the last 168 hours.  BNP (last 3 results) No results for input(s): PROBNP in the last 8760 hours.  HbA1C: No results for input(s): HGBA1C in the last 72 hours.  CBG:  Recent Labs Lab 04/28/16 0703  GLUCAP 63*    Lipid Profile: No results for input(s): CHOL, HDL, LDLCALC, TRIG, CHOLHDL, LDLDIRECT in the last 72 hours.  Thyroid Function Tests: No results for input(s): TSH, T4TOTAL, FREET4, T3FREE, THYROIDAB in the last 72 hours.  Anemia Panel: No results for input(s): VITAMINB12, FOLATE, FERRITIN, TIBC, IRON, RETICCTPCT in the last 72 hours.  Urine analysis:    Component Value Date/Time   COLORURINE YELLOW 04/25/2016  0019   APPEARANCEUR HAZY (A) 04/25/2016 0019   LABSPEC 1.018 04/25/2016 0019   PHURINE 8.0 04/25/2016 0019   GLUCOSEU NEGATIVE 04/25/2016 0019   HGBUR NEGATIVE 04/25/2016 0019   BILIRUBINUR NEGATIVE 04/25/2016 0019   BILIRUBINUR neg 06/29/2014 1436   KETONESUR 5 (A) 04/25/2016 0019   PROTEINUR 30 (A) 04/25/2016 0019   UROBILINOGEN negative 06/29/2014 1436   NITRITE NEGATIVE 04/25/2016 0019   LEUKOCYTESUR NEGATIVE 04/25/2016 0019    Sepsis Labs: Lactic Acid, Venous No results found for: LATICACIDVEN  MICROBIOLOGY: No results found for this or any previous visit (from the  past 240 hour(s)).  RADIOLOGY STUDIES/RESULTS: Dg Abd 1 View  Result Date: 04/28/2016 CLINICAL DATA:  Abdominal pain.  Bowel obstruction. EXAM: ABDOMEN - 1 VIEW COMPARISON:  04/27/2016 FINDINGS: Dilated loop of bowel within the left upper quadrant of the abdomen measures 3.3 cm. Previously 4.1 cm. There is gas and stool noted throughout the colon up to the level of the rectum. NG tube is folded upon itself within the stomach. The tip of the NG tube is at the level of the GE junction. IMPRESSION: 1. Persistent dilated loop of small bowel in the left upper quadrant measuring 3.3 cm. This is improved from previous exam and compatible with improving small bowel obstruction. Electronically Signed   By: Kerby Moors M.D.   On: 04/28/2016 09:03   Mr Abdomen W Wo Contrast  Result Date: 04/25/2016 CLINICAL DATA:  Abdominal bloating. Possible pancreatic mass on CT. History of rectal carcinoid tumor. EXAM: MRI ABDOMEN WITHOUT AND WITH CONTRAST TECHNIQUE: Multiplanar multisequence MR imaging of the abdomen was performed both before and after the administration of intravenous contrast. CONTRAST:  72mL MULTIHANCE GADOBENATE DIMEGLUMINE 529 MG/ML IV SOLN COMPARISON:  04/25/2016 and 12/13/2004 abdominopelvic CT. FINDINGS: Lower chest:  The visualized lower chest appears unremarkable. Hepatobiliary: No suspicious hepatic findings. There  are scattered small cysts which are stable. The gallbladder and biliary system appear normal. No evidence of biliary dilatation or choledocholithiasis. Pancreas: No evidence of pancreatic mass, surrounding inflammation or abnormal enhancement. The low-density seen within the pancreatic head is due to mild tortuosity of the main pancreatic duct which is not significantly dilated. Spleen: Normal in size without focal abnormality. Adrenals/Urinary Tract: Both adrenal glands appear normal. Both kidneys appear unremarkable. No hydronephrosis. Stomach/Bowel: Mildly dilated loops of small bowel are again noted within the left mid abdomen, as seen on earlier CT. There is a small amount of mesenteric fluid. No significant bowel wall thickening seen. Vascular/Lymphatic: There are no enlarged abdominal lymph nodes. Aortic and branch vessel atherosclerosis, better seen on CT. No evidence of large vessel occlusion. Other: None. Musculoskeletal: No acute or significant osseous findings. Lumbar spondylosis noted. IMPRESSION: 1. No suspicious pancreatic findings. The low-density in the pancreatic head on preceding CT attributed to tortuosity of the main pancreatic duct. 2. Persistent mid small bowel partial obstruction pattern. Electronically Signed   By: Richardean Sale M.D.   On: 04/25/2016 12:37   Ct Abdomen Pelvis W Contrast  Result Date: 04/25/2016 CLINICAL DATA:  Mid upper abdominal pain beginning today with nausea. Prior appendectomy. EXAM: CT ABDOMEN AND PELVIS WITH CONTRAST TECHNIQUE: Multidetector CT imaging of the abdomen and pelvis was performed using the standard protocol following bolus administration of intravenous contrast. CONTRAST:  162mL ISOVUE-300 IOPAMIDOL (ISOVUE-300) INJECTION 61% COMPARISON:  12/13/2004 FINDINGS: Lower chest: Subsegmental atelectasis in the lung bases. No pleural effusion. Hepatobiliary: Hypoattenuating/ hypoenhancing liver lesions measures 3 mm in the posterior right hepatic lobe  (series 301, image 9), 1.6 cm in the medial segment left hepatic lobe (series 301, image 20), and 10 mm more inferiorly in the right hepatic lobe (series 301, image 22), not significantly changed in size and compatible with a benign etiology. 12 mm low-density lesion in segment II on the prior CT is no longer clearly identified. The gallbladder is unremarkable. There is no biliary dilatation. Pancreas: 11 mm hypoattenuating lesion in the pancreatic head which appears new. No significant pancreatic ductal dilatation or atrophy. Spleen: Unremarkable. Adrenals/Urinary Tract: Unremarkable adrenal glands. No evidence of renal mass, calculi, or hydronephrosis. Decompressed bladder. Stomach/Bowel: The  stomach is within normal limits. There is mild diffuse dilatation of proximal small bowel loops measuring up to approximately 3.2 cm in diameter with fecalized contents in the central abdomen. There is a transition to decompressed small bowel in the midline the central abdomen just below the level of the umbilicus (series [id number], image 47). Scattered surgical clips are noted in this general region. There is prominent mucosal enhancement involving the transitional segment of small bowel at this location, without a discrete mass identified. The remainder of the more distal small bowel is decompressed. The colon is also largely decompressed, containing only a small amount of gas and stool. Colonic diverticulosis is noted without evidence of diverticulitis. Prior appendectomy. Vascular/Lymphatic: Abdominal aortic atherosclerosis without aneurysm. No enlarged lymph nodes. Reproductive: Uterus and bilateral adnexa are unremarkable. Other: No intraperitoneal free fluid. No abdominal wall mass or hernia. Musculoskeletal: Moderate lumbar disc and facet degeneration. IMPRESSION: 1. Small bowel obstruction with transition point in the central abdomen just below the umbilicus, possibly secondary to adhesions. 2. **An incidental finding of  potential clinical significance has been found. 11 mm low-density lesion in the pancreatic head, new from 2006 and indeterminate. Recommend follow-up contrast-enhanced abdominal MRI or pancreatic protocol CT in 2 years.** Study reviewed in person with Dr. Roxanne Mins on Q000111Q at 7:37 a.m. Electronically Signed   By: Logan Bores M.D.   On: 04/25/2016 08:12   Mr 3d Recon At Scanner  Result Date: 04/25/2016 CLINICAL DATA:  Abdominal bloating. Possible pancreatic mass on CT. History of rectal carcinoid tumor. EXAM: MRI ABDOMEN WITHOUT AND WITH CONTRAST TECHNIQUE: Multiplanar multisequence MR imaging of the abdomen was performed both before and after the administration of intravenous contrast. CONTRAST:  3mL MULTIHANCE GADOBENATE DIMEGLUMINE 529 MG/ML IV SOLN COMPARISON:  04/25/2016 and 12/13/2004 abdominopelvic CT. FINDINGS: Lower chest:  The visualized lower chest appears unremarkable. Hepatobiliary: No suspicious hepatic findings. There are scattered small cysts which are stable. The gallbladder and biliary system appear normal. No evidence of biliary dilatation or choledocholithiasis. Pancreas: No evidence of pancreatic mass, surrounding inflammation or abnormal enhancement. The low-density seen within the pancreatic head is due to mild tortuosity of the main pancreatic duct which is not significantly dilated. Spleen: Normal in size without focal abnormality. Adrenals/Urinary Tract: Both adrenal glands appear normal. Both kidneys appear unremarkable. No hydronephrosis. Stomach/Bowel: Mildly dilated loops of small bowel are again noted within the left mid abdomen, as seen on earlier CT. There is a small amount of mesenteric fluid. No significant bowel wall thickening seen. Vascular/Lymphatic: There are no enlarged abdominal lymph nodes. Aortic and branch vessel atherosclerosis, better seen on CT. No evidence of large vessel occlusion. Other: None. Musculoskeletal: No acute or significant osseous findings. Lumbar  spondylosis noted. IMPRESSION: 1. No suspicious pancreatic findings. The low-density in the pancreatic head on preceding CT attributed to tortuosity of the main pancreatic duct. 2. Persistent mid small bowel partial obstruction pattern. Electronically Signed   By: Richardean Sale M.D.   On: 04/25/2016 12:37   Dg Abd 2 Views  Result Date: 04/26/2016 CLINICAL DATA:  Re-evaluate small bowel obstruction. EXAM: ABDOMEN - 2 VIEW COMPARISON:  CT, 04/25/2016 FINDINGS: There are mildly dilated loops small bowel in the central abdomen, with scattered air-fluid levels on the erect view. Air is seen within a normal caliber colon and in the rectum. This is consistent with a partial small bowel obstruction, without significant change from the prior CT. No free air. Surgical vascular clips are noted across the central abdomen and in the  pelvis. IMPRESSION: 1. Persistent partial small bowel obstruction without significant change from the recent prior CT scan. No free air. Electronically Signed   By: Lajean Manes M.D.   On: 04/26/2016 10:10   Dg Abd Portable 1v  Result Date: 04/26/2016 CLINICAL DATA:  Followup small bowel obstruction EXAM: PORTABLE ABDOMEN - 1 VIEW COMPARISON:  04/26/2016 FINDINGS: Scattered large and small bowel gas is noted. Mild prominence of the small bowel is again identified stable from the prior exam. No free air is seen. Nasogastric catheter is noted coiled within the stomach. Degenerative changes of lumbar spine are again noted. IMPRESSION: Stable small bowel prominence. Nasogastric catheter coiled within the stomach. Electronically Signed   By: Inez Catalina M.D.   On: 04/26/2016 18:06   Dg Abd Portable 2v  Result Date: 04/27/2016 CLINICAL DATA:  Small bowel obstruction EXAM: PORTABLE ABDOMEN - 2 VIEW COMPARISON:  04/26/2016 FINDINGS: NG tube coils in the stomach. Nonobstructive bowel gas pattern. No free air organomegaly. IMPRESSION: No current evidence of bowel obstruction. NG tube remains  coiled in the stomach. Electronically Signed   By: Rolm Baptise M.D.   On: 04/27/2016 07:51     LOS: 2 days   Sehar Sedano, PAS  [employer]   If 7PM-7AM, please contact night-coverage [email or web address] Password [facility] 04/28/2016, 10:21 AM

## 2016-04-28 NOTE — Progress Notes (Signed)
Subjective: She is feeling much better NG was clamped, no nausea, and having bowel movements.  Objective: Vital signs in last 24 hours: Temp:  [98.4 F (36.9 C)] 98.4 F (36.9 C) (01/29 0555) Pulse Rate:  [84] 84 (01/29 0555) Resp:  [17] 17 (01/29 0555) BP: (164)/(68) 164/68 (01/29 0555) SpO2:  [97 %] 97 % (01/29 0555) Weight:  [70.3 kg (154 lb 14.4 oz)] 70.3 kg (154 lb 14.4 oz) (01/29 0555) Last BM Date: 04/27/16 Nothing by mouth till this a.m. IV fluids 1200 Urine 1000 NG 1900 recorded BM 3. MAXIMUM TEMPERATURE 99.3, vital signs stable. Hemoglobin 9.9 hematocrit 32.8 down from admission, BMP is stable, creatinine is better. Plain film shows a persistent dilated loop of small bowel left upper quadrant measured 3.3 cm improved from previous exams. Intake/Output from previous day: 01/28 0701 - 01/29 0700 In: 1200 [I.V.:1200] Out: 2900 [Urine:1000; Emesis/NG output:1900] Intake/Output this shift: Total I/O In: -  Out: 200 [Emesis/NG output:200]  General appearance: alert, cooperative and no distress GI: soft, non-tender; bowel sounds normal; no masses,  no organomegaly  Lab Results:   Recent Labs  04/26/16 0716 04/28/16 0527  WBC 10.9* 5.6  HGB 11.7* 9.9*  HCT 37.3 32.8*  PLT 264 219    BMET  Recent Labs  04/27/16 0528 04/28/16 0527  NA 139 139  K 4.0 4.1  CL 101 107  CO2 27 25  GLUCOSE 100* 62*  BUN 24* 21*  CREATININE 1.03* 0.81  CALCIUM 8.7* 8.0*   PT/INR No results for input(s): LABPROT, INR in the last 72 hours.   Recent Labs Lab 04/25/16 0003  AST 24  ALT 16  ALKPHOS 68  BILITOT 0.6  PROT 7.1  ALBUMIN 3.9     Lipase     Component Value Date/Time   LIPASE 25 04/25/2016 0855     Studies/Results: Dg Abd 1 View  Result Date: 04/28/2016 CLINICAL DATA:  Abdominal pain.  Bowel obstruction. EXAM: ABDOMEN - 1 VIEW COMPARISON:  04/27/2016 FINDINGS: Dilated loop of bowel within the left upper quadrant of the abdomen measures 3.3 cm.  Previously 4.1 cm. There is gas and stool noted throughout the colon up to the level of the rectum. NG tube is folded upon itself within the stomach. The tip of the NG tube is at the level of the GE junction. IMPRESSION: 1. Persistent dilated loop of small bowel in the left upper quadrant measuring 3.3 cm. This is improved from previous exam and compatible with improving small bowel obstruction. Electronically Signed   By: Kerby Moors M.D.   On: 04/28/2016 09:03   Dg Abd Portable 1v  Result Date: 04/26/2016 CLINICAL DATA:  Followup small bowel obstruction EXAM: PORTABLE ABDOMEN - 1 VIEW COMPARISON:  04/26/2016 FINDINGS: Scattered large and small bowel gas is noted. Mild prominence of the small bowel is again identified stable from the prior exam. No free air is seen. Nasogastric catheter is noted coiled within the stomach. Degenerative changes of lumbar spine are again noted. IMPRESSION: Stable small bowel prominence. Nasogastric catheter coiled within the stomach. Electronically Signed   By: Inez Catalina M.D.   On: 04/26/2016 18:06   Dg Abd Portable 2v  Result Date: 04/27/2016 CLINICAL DATA:  Small bowel obstruction EXAM: PORTABLE ABDOMEN - 2 VIEW COMPARISON:  04/26/2016 FINDINGS: NG tube coils in the stomach. Nonobstructive bowel gas pattern. No free air organomegaly. IMPRESSION: No current evidence of bowel obstruction. NG tube remains coiled in the stomach. Electronically Signed   By: Lennette Bihari  [place] M.D.   On: 04/27/2016 07:51    Medications: . aspirin  81 mg Oral Daily  . calcium-vitamin D  1 tablet Oral Daily  . enoxaparin (LOVENOX) injection  40 mg Subcutaneous Daily  . mouth rinse  15 mL Mouth Rinse BID  . mouth rinse  15 mL Mouth Rinse q12n4p  . Vitamin D (Ergocalciferol)  50,000 Units Oral Once per day on Mon Thu   . dextrose 5 % and 0.9% NaCl 1,000 mL with potassium chloride 20 mEq infusion 75 mL/hr at 04/28/16 1336    Assessment/Plan Partial small bowel obstruction with history of  two-stage colon resection for diverticulitis. Low-density lesion pancreatic head - etiology uncertain. History of rectal carcinoid tumor. GERD Hypertension Degenerative disc disease. FEN:  IV fluids/clear liquids ID: no antibiotics DVT:  Lovenox   Plan: I would mobilize. I would check the NG drainage prior to pulling the tube. She has more than 250 I would continue to watch her with the NG in place. She does well with a clear she can upper to full liquids later after the NG was discontinued.    LOS: 2 days    Kilea Mccarey 04/28/2016 [phone]

## 2016-04-29 NOTE — Discharge Planning (Signed)
Patient discharged home in stable condition. Verbalizes understanding of all discharge instructions, including home medications and follow up appointments. 

## 2016-04-29 NOTE — Progress Notes (Signed)
Subjective: She looks good this a.m. NG is out she is taking clears well. Positive BM 2. Plans are for a soft diet at lunch.  Objective: Vital signs in last 24 hours: Temp:  [98.3 F (36.8 C)-98.5 F (36.9 C)] 98.5 F (36.9 C) (01/30 0441) Pulse Rate:  [79-82] 79 (01/30 0441) Resp:  [17-18] 17 (01/30 0441) BP: (143-170)/(63-64) 143/64 (01/30 0441) SpO2:  [97 %-98 %] 98 % (01/30 0441) Last BM Date: 04/28/16 480 PO 1100 IV 700 urine recorded NG 300 - 1900 NG recorded day prior and clamped, NG removed yesterday BM x 2 Afebrile, VSS No labs, No film this Am Intake/Output from previous day: 01/29 0701 - 01/30 0700 In: 1607.5 [P.O.:480; I.V.:1127.5] Out: 1000 [Urine:700; Emesis/NG output:300] Intake/Output this shift: No intake/output data recorded.  General appearance: alert, cooperative and no distress GI: soft, non-tender; bowel sounds normal; no masses,  no organomegaly  Lab Results:   Recent Labs  04/28/16 0527  WBC 5.6  HGB 9.9*  HCT 32.8*  PLT 219    BMET  Recent Labs  04/27/16 0528 04/28/16 0527  NA 139 139  K 4.0 4.1  CL 101 107  CO2 27 25  GLUCOSE 100* 62*  BUN 24* 21*  CREATININE 1.03* 0.81  CALCIUM 8.7* 8.0*   PT/INR No results for input(s): LABPROT, INR in the last 72 hours.   Recent Labs Lab 04/25/16 0003  AST 24  ALT 16  ALKPHOS 68  BILITOT 0.6  PROT 7.1  ALBUMIN 3.9     Lipase     Component Value Date/Time   LIPASE 25 04/25/2016 0855     Studies/Results: Dg Abd 1 View  Result Date: 04/28/2016 CLINICAL DATA:  Abdominal pain.  Bowel obstruction. EXAM: ABDOMEN - 1 VIEW COMPARISON:  04/27/2016 FINDINGS: Dilated loop of bowel within the left upper quadrant of the abdomen measures 3.3 cm. Previously 4.1 cm. There is gas and stool noted throughout the colon up to the level of the rectum. NG tube is folded upon itself within the stomach. The tip of the NG tube is at the level of the GE junction. IMPRESSION: 1. Persistent dilated  loop of small bowel in the left upper quadrant measuring 3.3 cm. This is improved from previous exam and compatible with improving small bowel obstruction. Electronically Signed   By: Kerby Moors M.D.   On: 04/28/2016 09:03    Medications: . aspirin  81 mg Oral Daily  . calcium-vitamin D  1 tablet Oral Daily  . enoxaparin (LOVENOX) injection  40 mg Subcutaneous Daily  . mouth rinse  15 mL Mouth Rinse BID  . mouth rinse  15 mL Mouth Rinse q12n4p  . Vitamin D (Ergocalciferol)  50,000 Units Oral Once per day on Mon Thu   . dextrose 5 % and 0.9% NaCl 1,000 mL with potassium chloride 20 mEq infusion 75 mL/hr at 04/29/16 0248   Assessment/Plan Partial small bowel obstruction with history of two-stage colon resection for diverticulitis. Low-density lesion pancreatic head - etiology uncertain. History of rectal carcinoid tumor. GERD Hypertension Degenerative disc disease. FEN:  IV fluids/soft diet this Am ID: no antibiotics DVT:  Lovenox   Plan: Partial small bowel obstruction appears to resolve. Her diet has been advanced. Please call if we can be of any further assistance.    LOS: 3 days    JENNINGS,WILLARD 04/29/2016 [phone]  Agree with above. She is doing well - and hopes to maybe go home later today.  Her son, Randall Hiss, is in  the room with her. Her colon surgery was about 20 years ago in [place].  Alphonsa Overall, MD, [facility] Pager: [phone] Office phone:  [phone]

## 2016-04-29 NOTE — Progress Notes (Signed)
PROGRESS NOTE        PATIENT DETAILS Name: Anita Perez Age: 81 y.o. Sex: female Date of Birth: Dec 23, 1935 Admit Date: 04/25/2016 Admitting Physician Truett Mainland, DO [id number], MD  Brief Narrative: Patient is a 81 y.o. female with h/o HTN, dyslipidemia, and restless leg syndrome. Presented to ED for abdominal pain, was found to have SBO and admitted for further management. See below for details.   Subjective: Patient had 1 BM last night, denies abdominal pain at this time. Feels much better overall, tolerating liquid diet.   Assessment/Plan: SBO (small bowel obstruction): Due to adhesions prior to surgery. Patient had 1 BM last night, denies current abdominal pain. NG tube was removed yesterday, and patient is tolerating clear liquid diet. Advance diet to soft solids. Continue supportive care, general surgery following.   Hypertension: Compensated, continue hydralazine IV prn. If soft diet advancement is tolerated well consider restarting oral antihypertensive regimen tomorrow.    Hyperlipidemia: Hold statins, consider resuming tomorrow if soft diet advancement is tolerated well today.   Peripheral neuropathy / restless leg syndrome: Hold Lyrica and Requip. Resume when diet advancement is tolerated.     CT abdomen showed 11 mm low-density lesion in pancreatic head: Subsequent Mri abdomen did not show any suspicious pancreatic findings. CT findings attributed to tortuosity of the main pancreatic duct.   DVT Prophylaxis: Prophylactic Lovenox  Code Status: Full code   Family Communication: Spoke with son at bedside   Disposition Plan: Remain inpatient  Antimicrobial agents: Anti-infectives    None      Procedures: None at this time   CONSULTS: None at this time  Time spent: 25 minutes-Greater than 50% of this time was spent in counseling, explanation of diagnosis, planning of further management, and coordination of  care.  MEDICATIONS: Scheduled Meds: . aspirin  81 mg Oral Daily  . calcium-vitamin D  1 tablet Oral Daily  . enoxaparin (LOVENOX) injection  40 mg Subcutaneous Daily  . Vitamin D (Ergocalciferol)  50,000 Units Oral Once per day on Mon Thu   Continuous Infusions: . dextrose 5 % and 0.9% NaCl 1,000 mL with potassium chloride 20 mEq infusion 75 mL/hr at 04/29/16 0248   PRN Meds:.acetaminophen **OR** acetaminophen, alum & mag hydroxide-simeth, hydrALAZINE, morphine injection, ondansetron **OR** ondansetron (ZOFRAN) IV, polyethylene glycol, traZODone   PHYSICAL EXAM: Vital signs: Vitals:   04/28/16 0555 04/28/16 1442 04/28/16 2009 04/29/16 0441  BP: (!) 164/68 (!) 162/64 (!) 170/63 (!) 143/64  Pulse: 84 82 81 79  Resp: 17  18 17   Temp: 98.4 F (36.9 C) 98.5 F (36.9 C) 98.3 F (36.8 C) 98.5 F (36.9 C)  TempSrc: Oral Oral Oral Oral  SpO2: 97% 97% 98% 98%  Weight: 70.3 kg (154 lb 14.4 oz)     Height:       Filed Weights   04/26/16 0500 04/27/16 0447 04/28/16 0555  Weight: 71.2 kg (156 lb 15.5 oz) 71.6 kg (157 lb 12.8 oz) 70.3 kg (154 lb 14.4 oz)   Body mass index is 27.44 kg/m.   General appearance: Awake, alert, not in any distress. Speech Clear. Not toxic Looking Eyes: PERRLA,no scleral icterus.Pink conjunctiva HEENT: Atraumatic and Normocephalic Neck: Supple, no JVD. No cervical lymphadenopathy.  Resp:Good air entry bilaterally, no added sounds  CVS: S1 S2 regular, no murmurs.  GI: Bowel sounds present, Non tender and  not distended with no gaurding, rigidity or rebound. No organomegaly Extremities: B/L Lower Ext shows no edema, both legs are warm to touch Neurology:  Speech clear,Non focal. Psychiatric: Normal judgment and insight. AAO x 3. Normal mood. Musculoskeletal:No digital cyanosis Skin:No Rash, warm and dry Wounds:N/A  I have personally reviewed following labs and imaging studies  LABORATORY DATA: CBC:  Recent Labs Lab 04/25/16 0003 04/25/16 1552  04/26/16 0716 04/28/16 0527  WBC 13.6* 13.6* 10.9* 5.6  NEUTROABS 11.2*  --   --   --   HGB 12.4 12.3 11.7* 9.9*  HCT 39.2 38.8 37.3 32.8*  MCV 86.0 86.8 87.4 88.2  PLT 289 268 264 [id number]    Basic Metabolic Panel:  Recent Labs Lab 04/25/16 0003 04/25/16 0845 04/25/16 1552 04/26/16 0716 04/27/16 0528 04/28/16 0527  NA 138  --   --  138 139 139  K 3.6  --   --  4.3 4.0 4.1  CL 103  --   --  102 101 107  CO2 27  --   --  27 27 25   GLUCOSE 135*  --   --  122* 100* 62*  BUN 13  --   --  16 24* 21*  CREATININE 0.80  --  1.02* 1.03* 1.03* 0.81  CALCIUM 9.0  --   --  9.0 8.7* 8.0*  MG  --  1.9  --   --   --   --     GFR: Estimated Creatinine Clearance: 53.9 mL/min (by C-G formula based on SCr of 0.81 mg/dL).  Liver Function Tests:  Recent Labs Lab 04/25/16 0003  AST 24  ALT 16  ALKPHOS 68  BILITOT 0.6  PROT 7.1  ALBUMIN 3.9    Recent Labs Lab 04/25/16 0855  LIPASE 25  AMYLASE 47   No results for input(s): AMMONIA in the last 168 hours.  Coagulation Profile: No results for input(s): INR, PROTIME in the last 168 hours.  Cardiac Enzymes: No results for input(s): CKTOTAL, CKMB, CKMBINDEX, TROPONINI in the last 168 hours.  BNP (last 3 results) No results for input(s): PROBNP in the last 8760 hours.  HbA1C: No results for input(s): HGBA1C in the last 72 hours.  CBG:  Recent Labs Lab 04/28/16 0703  GLUCAP 63*    Lipid Profile: No results for input(s): CHOL, HDL, LDLCALC, TRIG, CHOLHDL, LDLDIRECT in the last 72 hours.  Thyroid Function Tests: No results for input(s): TSH, T4TOTAL, FREET4, T3FREE, THYROIDAB in the last 72 hours.  Anemia Panel: No results for input(s): VITAMINB12, FOLATE, FERRITIN, TIBC, IRON, RETICCTPCT in the last 72 hours.  Urine analysis:    Component Value Date/Time   COLORURINE YELLOW 04/25/2016 0019   APPEARANCEUR HAZY (A) 04/25/2016 0019   LABSPEC 1.018 04/25/2016 0019   PHURINE 8.0 04/25/2016 0019   GLUCOSEU NEGATIVE  04/25/2016 0019   HGBUR NEGATIVE 04/25/2016 0019   BILIRUBINUR NEGATIVE 04/25/2016 0019   BILIRUBINUR neg 06/29/2014 1436   KETONESUR 5 (A) 04/25/2016 0019   PROTEINUR 30 (A) 04/25/2016 0019   UROBILINOGEN negative 06/29/2014 1436   NITRITE NEGATIVE 04/25/2016 0019   LEUKOCYTESUR NEGATIVE 04/25/2016 0019    Sepsis Labs: Lactic Acid, Venous No results found for: LATICACIDVEN  MICROBIOLOGY: No results found for this or any previous visit (from the past 240 hour(s)).  RADIOLOGY STUDIES/RESULTS: Dg Abd 1 View  Result Date: 04/28/2016 CLINICAL DATA:  Abdominal pain.  Bowel obstruction. EXAM: ABDOMEN - 1 VIEW COMPARISON:  04/27/2016 FINDINGS: Dilated loop of bowel within the  left upper quadrant of the abdomen measures 3.3 cm. Previously 4.1 cm. There is gas and stool noted throughout the colon up to the level of the rectum. NG tube is folded upon itself within the stomach. The tip of the NG tube is at the level of the GE junction. IMPRESSION: 1. Persistent dilated loop of small bowel in the left upper quadrant measuring 3.3 cm. This is improved from previous exam and compatible with improving small bowel obstruction. Electronically Signed   By: Kerby Moors M.D.   On: 04/28/2016 09:03   Mr Abdomen W Wo Contrast  Result Date: 04/25/2016 CLINICAL DATA:  Abdominal bloating. Possible pancreatic mass on CT. History of rectal carcinoid tumor. EXAM: MRI ABDOMEN WITHOUT AND WITH CONTRAST TECHNIQUE: Multiplanar multisequence MR imaging of the abdomen was performed both before and after the administration of intravenous contrast. CONTRAST:  72mL MULTIHANCE GADOBENATE DIMEGLUMINE 529 MG/ML IV SOLN COMPARISON:  04/25/2016 and 12/13/2004 abdominopelvic CT. FINDINGS: Lower chest:  The visualized lower chest appears unremarkable. Hepatobiliary: No suspicious hepatic findings. There are scattered small cysts which are stable. The gallbladder and biliary system appear normal. No evidence of biliary dilatation or  choledocholithiasis. Pancreas: No evidence of pancreatic mass, surrounding inflammation or abnormal enhancement. The low-density seen within the pancreatic head is due to mild tortuosity of the main pancreatic duct which is not significantly dilated. Spleen: Normal in size without focal abnormality. Adrenals/Urinary Tract: Both adrenal glands appear normal. Both kidneys appear unremarkable. No hydronephrosis. Stomach/Bowel: Mildly dilated loops of small bowel are again noted within the left mid abdomen, as seen on earlier CT. There is a small amount of mesenteric fluid. No significant bowel wall thickening seen. Vascular/Lymphatic: There are no enlarged abdominal lymph nodes. Aortic and branch vessel atherosclerosis, better seen on CT. No evidence of large vessel occlusion. Other: None. Musculoskeletal: No acute or significant osseous findings. Lumbar spondylosis noted. IMPRESSION: 1. No suspicious pancreatic findings. The low-density in the pancreatic head on preceding CT attributed to tortuosity of the main pancreatic duct. 2. Persistent mid small bowel partial obstruction pattern. Electronically Signed   By: Richardean Sale M.D.   On: 04/25/2016 12:37   Ct Abdomen Pelvis W Contrast  Result Date: 04/25/2016 CLINICAL DATA:  Mid upper abdominal pain beginning today with nausea. Prior appendectomy. EXAM: CT ABDOMEN AND PELVIS WITH CONTRAST TECHNIQUE: Multidetector CT imaging of the abdomen and pelvis was performed using the standard protocol following bolus administration of intravenous contrast. CONTRAST:  123mL ISOVUE-300 IOPAMIDOL (ISOVUE-300) INJECTION 61% COMPARISON:  12/13/2004 FINDINGS: Lower chest: Subsegmental atelectasis in the lung bases. No pleural effusion. Hepatobiliary: Hypoattenuating/ hypoenhancing liver lesions measures 3 mm in the posterior right hepatic lobe (series 301, image 9), 1.6 cm in the medial segment left hepatic lobe (series 301, image 20), and 10 mm more inferiorly in the right  hepatic lobe (series 301, image 22), not significantly changed in size and compatible with a benign etiology. 12 mm low-density lesion in segment II on the prior CT is no longer clearly identified. The gallbladder is unremarkable. There is no biliary dilatation. Pancreas: 11 mm hypoattenuating lesion in the pancreatic head which appears new. No significant pancreatic ductal dilatation or atrophy. Spleen: Unremarkable. Adrenals/Urinary Tract: Unremarkable adrenal glands. No evidence of renal mass, calculi, or hydronephrosis. Decompressed bladder. Stomach/Bowel: The stomach is within normal limits. There is mild diffuse dilatation of proximal small bowel loops measuring up to approximately 3.2 cm in diameter with fecalized contents in the central abdomen. There is a transition to decompressed small  bowel in the midline the central abdomen just below the level of the umbilicus (series [id number], image 47). Scattered surgical clips are noted in this general region. There is prominent mucosal enhancement involving the transitional segment of small bowel at this location, without a discrete mass identified. The remainder of the more distal small bowel is decompressed. The colon is also largely decompressed, containing only a small amount of gas and stool. Colonic diverticulosis is noted without evidence of diverticulitis. Prior appendectomy. Vascular/Lymphatic: Abdominal aortic atherosclerosis without aneurysm. No enlarged lymph nodes. Reproductive: Uterus and bilateral adnexa are unremarkable. Other: No intraperitoneal free fluid. No abdominal wall mass or hernia. Musculoskeletal: Moderate lumbar disc and facet degeneration. IMPRESSION: 1. Small bowel obstruction with transition point in the central abdomen just below the umbilicus, possibly secondary to adhesions. 2. **An incidental finding of potential clinical significance has been found. 11 mm low-density lesion in the pancreatic head, new from 2006 and indeterminate.  Recommend follow-up contrast-enhanced abdominal MRI or pancreatic protocol CT in 2 years.** Study reviewed in person with Dr. Roxanne Mins on [id number] at 7:37 a.m. Electronically Signed   By: Logan Bores M.D.   On: 04/25/2016 08:12   Mr 3d Recon At Scanner  Result Date: 04/25/2016 CLINICAL DATA:  Abdominal bloating. Possible pancreatic mass on CT. History of rectal carcinoid tumor. EXAM: MRI ABDOMEN WITHOUT AND WITH CONTRAST TECHNIQUE: Multiplanar multisequence MR imaging of the abdomen was performed both before and after the administration of intravenous contrast. CONTRAST:  39mL MULTIHANCE GADOBENATE DIMEGLUMINE 529 MG/ML IV SOLN COMPARISON:  04/25/2016 and 12/13/2004 abdominopelvic CT. FINDINGS: Lower chest:  The visualized lower chest appears unremarkable. Hepatobiliary: No suspicious hepatic findings. There are scattered small cysts which are stable. The gallbladder and biliary system appear normal. No evidence of biliary dilatation or choledocholithiasis. Pancreas: No evidence of pancreatic mass, surrounding inflammation or abnormal enhancement. The low-density seen within the pancreatic head is due to mild tortuosity of the main pancreatic duct which is not significantly dilated. Spleen: Normal in size without focal abnormality. Adrenals/Urinary Tract: Both adrenal glands appear normal. Both kidneys appear unremarkable. No hydronephrosis. Stomach/Bowel: Mildly dilated loops of small bowel are again noted within the left mid abdomen, as seen on earlier CT. There is a small amount of mesenteric fluid. No significant bowel wall thickening seen. Vascular/Lymphatic: There are no enlarged abdominal lymph nodes. Aortic and branch vessel atherosclerosis, better seen on CT. No evidence of large vessel occlusion. Other: None. Musculoskeletal: No acute or significant osseous findings. Lumbar spondylosis noted. IMPRESSION: 1. No suspicious pancreatic findings. The low-density in the pancreatic head on preceding CT  attributed to tortuosity of the main pancreatic duct. 2. Persistent mid small bowel partial obstruction pattern. Electronically Signed   By: Richardean Sale M.D.   On: 04/25/2016 12:37   Dg Abd 2 Views  Result Date: 04/26/2016 CLINICAL DATA:  Re-evaluate small bowel obstruction. EXAM: ABDOMEN - 2 VIEW COMPARISON:  CT, 04/25/2016 FINDINGS: There are mildly dilated loops small bowel in the central abdomen, with scattered air-fluid levels on the erect view. Air is seen within a normal caliber colon and in the rectum. This is consistent with a partial small bowel obstruction, without significant change from the prior CT. No free air. Surgical vascular clips are noted across the central abdomen and in the pelvis. IMPRESSION: 1. Persistent partial small bowel obstruction without significant change from the recent prior CT scan. No free air. Electronically Signed   By: Lajean Manes M.D.   On: 04/26/2016 10:10   Dg  Abd Portable 1v  Result Date: 04/26/2016 CLINICAL DATA:  Followup small bowel obstruction EXAM: PORTABLE ABDOMEN - 1 VIEW COMPARISON:  04/26/2016 FINDINGS: Scattered large and small bowel gas is noted. Mild prominence of the small bowel is again identified stable from the prior exam. No free air is seen. Nasogastric catheter is noted coiled within the stomach. Degenerative changes of lumbar spine are again noted. IMPRESSION: Stable small bowel prominence. Nasogastric catheter coiled within the stomach. Electronically Signed   By: Inez Catalina M.D.   On: 04/26/2016 18:06   Dg Abd Portable 2v  Result Date: 04/27/2016 CLINICAL DATA:  Small bowel obstruction EXAM: PORTABLE ABDOMEN - 2 VIEW COMPARISON:  04/26/2016 FINDINGS: NG tube coils in the stomach. Nonobstructive bowel gas pattern. No free air organomegaly. IMPRESSION: No current evidence of bowel obstruction. NG tube remains coiled in the stomach. Electronically Signed   By: Rolm Baptise M.D.   On: 04/27/2016 07:51     LOS: 3 days   Alaster Asfaw, PAS  [employer]   If 7PM-7AM, please contact night-coverage [email or web address] Password [email or web address] 04/29/2016, 10:23 AM

## 2016-04-29 NOTE — Discharge Summary (Signed)
PATIENT DETAILS Name: Anita Perez Age: 81 y.o. Sex: female Date of Birth: 07-07-1935 MRN: [id number]. Admitting Physician: Truett Mainland, DO [id number], MD  Admit Date: 04/25/2016 Discharge date: 04/29/2016  Recommendations for Outpatient Follow-up:  1. Follow up with PCP in 1-2 weeks.  2. Please obtain BMP/CBC in one week.   Admitted From:  Home  Disposition:  [place]: No  Equipment/Devices: None  Discharge Condition: Stable  CODE STATUS: FULL CODE  Diet recommendation:  Heart Healthy  Brief Summary: See H&P, Labs, Consult and Test reports for all details in brief. Patient is a 81 y.o. female with h/o HTN, dyslipidemia, and restless leg syndrome. Presented to ED for abdominal pain, was found to have SBO and admitted for further management. See below for details.   Brief Hospital Course:  SBO (small bowel obstruction): Due to adhesions prior to surgery. She was placed on NGT and NPO for bowel rest. Surgery was consulted, but partial small bowel obstruction resolved from simple bowel rest. NG tube was removed yesterday, she has had 3 BM since yesterday. Diet advancement to soft solids has been tolerated well. Stable for discharge home.       Hypertension: Controlled- upon discharge resume oral antihypertensive regimen.   Hyperlipidemia: Resume oral statins upon discharge.  Peripheral neuropathy / restless leg syndrome: Resume Lyrica and Requip on discharge.   CT abdomen showed 11 mm low-density lesion in pancreatic head: Subsequent MRI abdomen did not show any suspicious pancreatic findings. CT findings attributed to tortuosity of the main pancreatic duct.   Procedures/Studies: None  Discharge Diagnoses:  Principal Problem:   SBO (small bowel obstruction) Active Problems:   Essential hypertension, benign   Hyperlipidemia with target LDL less than 100   Neuropathy (HCC)   Vitamin D deficiency   Restless leg syndrome   Discharge  Instructions:  Activity:  As tolerated with Full fall precautions use walker/cane & assistance as needed  Discharge Instructions    Call MD for:  persistant nausea and vomiting    Complete by:  As directed    Call MD for:  severe uncontrolled pain    Complete by:  As directed    Call MD for:  temperature >100.4    Complete by:  As directed    Diet - low sodium heart healthy    Complete by:  As directed    Increase activity slowly    Complete by:  As directed      Allergies as of 04/29/2016      Reactions   Pepto-bismol [bismuth] Other (See Comments)   Turned mouth black inside      Medication List    TAKE these medications   amLODipine 10 MG tablet Commonly known as:  NORVASC Take 1 tablet (10 mg total) by mouth daily.   aspirin 81 MG tablet Take 81 mg by mouth daily.   atorvastatin 40 MG tablet Commonly known as:  LIPITOR Take 1 tablet (40 mg total) by mouth daily.   CALCIUM + D PO Take 600 mg by mouth daily.   pregabalin 75 MG capsule Commonly known as:  LYRICA Take 1 capsule (75 mg total) by mouth daily. For leg pain   raloxifene 60 MG tablet Commonly known as:  EVISTA Take 1 tablet (60 mg total) by mouth daily. For bone health and breast cancer prevention   rOPINIRole 0.5 MG tablet Commonly known as:  REQUIP Take 1 tablet (0.5 mg total) by mouth at bedtime.   triamterene-hydrochlorothiazide  37.5-25 MG tablet Commonly known as:  MAXZIDE-25 Take 1 tablet by mouth daily.   Vitamin D (Ergocalciferol) 50000 units Caps capsule Commonly known as:  DRISDOL Take 1 capsule (50,000 Units total) by mouth 2 (two) times a week.      Follow-up Information    STACKS,WARREN, MD. Schedule an appointment as soon as possible for a visit in 1 week(s).   Specialty:  Family Medicine Contact information: [address] [place]  [address] [phone]          Allergies  Allergen Reactions  . Pepto-Bismol [Bismuth] Other (See Comments)    Turned mouth black  inside    Consultations:  General Surgery Consult   Other Procedures/Studies: Dg Abd 1 View  Result Date: 04/28/2016 CLINICAL DATA:  Abdominal pain.  Bowel obstruction. EXAM: ABDOMEN - 1 VIEW COMPARISON:  04/27/2016 FINDINGS: Dilated loop of bowel within the left upper quadrant of the abdomen measures 3.3 cm. Previously 4.1 cm. There is gas and stool noted throughout the colon up to the level of the rectum. NG tube is folded upon itself within the stomach. The tip of the NG tube is at the level of the GE junction. IMPRESSION: 1. Persistent dilated loop of small bowel in the left upper quadrant measuring 3.3 cm. This is improved from previous exam and compatible with improving small bowel obstruction. Electronically Signed   By: Kerby Moors M.D.   On: 04/28/2016 09:03   Mr Abdomen W Wo Contrast  Result Date: 04/25/2016 CLINICAL DATA:  Abdominal bloating. Possible pancreatic mass on CT. History of rectal carcinoid tumor. EXAM: MRI ABDOMEN WITHOUT AND WITH CONTRAST TECHNIQUE: Multiplanar multisequence MR imaging of the abdomen was performed both before and after the administration of intravenous contrast. CONTRAST:  16mL MULTIHANCE GADOBENATE DIMEGLUMINE 529 MG/ML IV SOLN COMPARISON:  04/25/2016 and 12/13/2004 abdominopelvic CT. FINDINGS: Lower chest:  The visualized lower chest appears unremarkable. Hepatobiliary: No suspicious hepatic findings. There are scattered small cysts which are stable. The gallbladder and biliary system appear normal. No evidence of biliary dilatation or choledocholithiasis. Pancreas: No evidence of pancreatic mass, surrounding inflammation or abnormal enhancement. The low-density seen within the pancreatic head is due to mild tortuosity of the main pancreatic duct which is not significantly dilated. Spleen: Normal in size without focal abnormality. Adrenals/Urinary Tract: Both adrenal glands appear normal. Both kidneys appear unremarkable. No hydronephrosis. Stomach/Bowel:  Mildly dilated loops of small bowel are again noted within the left mid abdomen, as seen on earlier CT. There is a small amount of mesenteric fluid. No significant bowel wall thickening seen. Vascular/Lymphatic: There are no enlarged abdominal lymph nodes. Aortic and branch vessel atherosclerosis, better seen on CT. No evidence of large vessel occlusion. Other: None. Musculoskeletal: No acute or significant osseous findings. Lumbar spondylosis noted. IMPRESSION: 1. No suspicious pancreatic findings. The low-density in the pancreatic head on preceding CT attributed to tortuosity of the main pancreatic duct. 2. Persistent mid small bowel partial obstruction pattern. Electronically Signed   By: Richardean Sale M.D.   On: 04/25/2016 12:37   Ct Abdomen Pelvis W Contrast  Result Date: 04/25/2016 CLINICAL DATA:  Mid upper abdominal pain beginning today with nausea. Prior appendectomy. EXAM: CT ABDOMEN AND PELVIS WITH CONTRAST TECHNIQUE: Multidetector CT imaging of the abdomen and pelvis was performed using the standard protocol following bolus administration of intravenous contrast. CONTRAST:  180mL ISOVUE-300 IOPAMIDOL (ISOVUE-300) INJECTION 61% COMPARISON:  12/13/2004 FINDINGS: Lower chest: Subsegmental atelectasis in the lung bases. No pleural effusion. Hepatobiliary: Hypoattenuating/ hypoenhancing liver  lesions measures 3 mm in the posterior right hepatic lobe (series 301, image 9), 1.6 cm in the medial segment left hepatic lobe (series 301, image 20), and 10 mm more inferiorly in the right hepatic lobe (series 301, image 22), not significantly changed in size and compatible with a benign etiology. 12 mm low-density lesion in segment II on the prior CT is no longer clearly identified. The gallbladder is unremarkable. There is no biliary dilatation. Pancreas: 11 mm hypoattenuating lesion in the pancreatic head which appears new. No significant pancreatic ductal dilatation or atrophy. Spleen: Unremarkable.  Adrenals/Urinary Tract: Unremarkable adrenal glands. No evidence of renal mass, calculi, or hydronephrosis. Decompressed bladder. Stomach/Bowel: The stomach is within normal limits. There is mild diffuse dilatation of proximal small bowel loops measuring up to approximately 3.2 cm in diameter with fecalized contents in the central abdomen. There is a transition to decompressed small bowel in the midline the central abdomen just below the level of the umbilicus (series [id number], image 47). Scattered surgical clips are noted in this general region. There is prominent mucosal enhancement involving the transitional segment of small bowel at this location, without a discrete mass identified. The remainder of the more distal small bowel is decompressed. The colon is also largely decompressed, containing only a small amount of gas and stool. Colonic diverticulosis is noted without evidence of diverticulitis. Prior appendectomy. Vascular/Lymphatic: Abdominal aortic atherosclerosis without aneurysm. No enlarged lymph nodes. Reproductive: Uterus and bilateral adnexa are unremarkable. Other: No intraperitoneal free fluid. No abdominal wall mass or hernia. Musculoskeletal: Moderate lumbar disc and facet degeneration. IMPRESSION: 1. Small bowel obstruction with transition point in the central abdomen just below the umbilicus, possibly secondary to adhesions. 2. **An incidental finding of potential clinical significance has been found. 11 mm low-density lesion in the pancreatic head, new from 2006 and indeterminate. Recommend follow-up contrast-enhanced abdominal MRI or pancreatic protocol CT in 2 years.** Study reviewed in person with Dr. Roxanne Mins on Q000111Q at 7:37 a.m. Electronically Signed   By: Logan Bores M.D.   On: 04/25/2016 08:12   Mr 3d Recon At Scanner  Result Date: 04/25/2016 CLINICAL DATA:  Abdominal bloating. Possible pancreatic mass on CT. History of rectal carcinoid tumor. EXAM: MRI ABDOMEN WITHOUT AND WITH  CONTRAST TECHNIQUE: Multiplanar multisequence MR imaging of the abdomen was performed both before and after the administration of intravenous contrast. CONTRAST:  48mL MULTIHANCE GADOBENATE DIMEGLUMINE 529 MG/ML IV SOLN COMPARISON:  04/25/2016 and 12/13/2004 abdominopelvic CT. FINDINGS: Lower chest:  The visualized lower chest appears unremarkable. Hepatobiliary: No suspicious hepatic findings. There are scattered small cysts which are stable. The gallbladder and biliary system appear normal. No evidence of biliary dilatation or choledocholithiasis. Pancreas: No evidence of pancreatic mass, surrounding inflammation or abnormal enhancement. The low-density seen within the pancreatic head is due to mild tortuosity of the main pancreatic duct which is not significantly dilated. Spleen: Normal in size without focal abnormality. Adrenals/Urinary Tract: Both adrenal glands appear normal. Both kidneys appear unremarkable. No hydronephrosis. Stomach/Bowel: Mildly dilated loops of small bowel are again noted within the left mid abdomen, as seen on earlier CT. There is a small amount of mesenteric fluid. No significant bowel wall thickening seen. Vascular/Lymphatic: There are no enlarged abdominal lymph nodes. Aortic and branch vessel atherosclerosis, better seen on CT. No evidence of large vessel occlusion. Other: None. Musculoskeletal: No acute or significant osseous findings. Lumbar spondylosis noted. IMPRESSION: 1. No suspicious pancreatic findings. The low-density in the pancreatic head on preceding CT attributed to tortuosity of  the main pancreatic duct. 2. Persistent mid small bowel partial obstruction pattern. Electronically Signed   By: Richardean Sale M.D.   On: 04/25/2016 12:37   Dg Abd 2 Views  Result Date: 04/26/2016 CLINICAL DATA:  Re-evaluate small bowel obstruction. EXAM: ABDOMEN - 2 VIEW COMPARISON:  CT, 04/25/2016 FINDINGS: There are mildly dilated loops small bowel in the central abdomen, with scattered  air-fluid levels on the erect view. Air is seen within a normal caliber colon and in the rectum. This is consistent with a partial small bowel obstruction, without significant change from the prior CT. No free air. Surgical vascular clips are noted across the central abdomen and in the pelvis. IMPRESSION: 1. Persistent partial small bowel obstruction without significant change from the recent prior CT scan. No free air. Electronically Signed   By: Lajean Manes M.D.   On: 04/26/2016 10:10   Dg Abd Portable 1v  Result Date: 04/26/2016 CLINICAL DATA:  Followup small bowel obstruction EXAM: PORTABLE ABDOMEN - 1 VIEW COMPARISON:  04/26/2016 FINDINGS: Scattered large and small bowel gas is noted. Mild prominence of the small bowel is again identified stable from the prior exam. No free air is seen. Nasogastric catheter is noted coiled within the stomach. Degenerative changes of lumbar spine are again noted. IMPRESSION: Stable small bowel prominence. Nasogastric catheter coiled within the stomach. Electronically Signed   By: Inez Catalina M.D.   On: 04/26/2016 18:06   Dg Abd Portable 2v  Result Date: 04/27/2016 CLINICAL DATA:  Small bowel obstruction EXAM: PORTABLE ABDOMEN - 2 VIEW COMPARISON:  04/26/2016 FINDINGS: NG tube coils in the stomach. Nonobstructive bowel gas pattern. No free air organomegaly. IMPRESSION: No current evidence of bowel obstruction. NG tube remains coiled in the stomach. Electronically Signed   By: Rolm Baptise M.D.   On: 04/27/2016 07:51    TODAY-DAY OF DISCHARGE:  Subjective:   Anita Perez today has no headache,no chest abdominal pain,no new weakness tingling or numbness, feels much better wants to go home today.  Objective:   Blood pressure (!) 143/64, pulse 79, temperature 98.5 F (36.9 C), temperature source Oral, resp. rate 17, height 5\' 3"  (1.6 m), weight 70.3 kg (154 lb 14.4 oz), SpO2 98 %.  Intake/Output Summary (Last 24 hours) at 04/29/16 1212 Last data filed at  04/29/16 1009  Gross per 24 hour  Intake           2072.5 ml  Output              800 ml  Net           1272.5 ml   Filed Weights   04/26/16 0500 04/27/16 0447 04/28/16 0555  Weight: 71.2 kg (156 lb 15.5 oz) 71.6 kg (157 lb 12.8 oz) 70.3 kg (154 lb 14.4 oz)    Exam: Neuro: AAOX3, No new F.N deficits, Normal affect HEENT: [place].AT,PERRLA, Supple Neck,No JVD, No cervical lymphadenopathy appriciated.  Pulm: Symmetrical Chest wall movement, Good air movement bilaterally, CTAB Cardiac: RRR,No Gallops,Rubs or new Murmurs, No Parasternal Heave GI: +ve B.Sounds, Abd Soft, Non tender, No organomegaly appriciated, No rebound -guarding or rigidity. Skin: No Cyanosis, Clubbing or edema, No new Rash or bruise   PERTINENT RADIOLOGIC STUDIES: Dg Abd 1 View  Result Date: 04/28/2016 CLINICAL DATA:  Abdominal pain.  Bowel obstruction. EXAM: ABDOMEN - 1 VIEW COMPARISON:  04/27/2016 FINDINGS: Dilated loop of bowel within the left upper quadrant of the abdomen measures 3.3 cm. Previously 4.1 cm. There is gas and stool noted  throughout the colon up to the level of the rectum. NG tube is folded upon itself within the stomach. The tip of the NG tube is at the level of the GE junction. IMPRESSION: 1. Persistent dilated loop of small bowel in the left upper quadrant measuring 3.3 cm. This is improved from previous exam and compatible with improving small bowel obstruction. Electronically Signed   By: Kerby Moors M.D.   On: 04/28/2016 09:03   Mr Abdomen W Wo Contrast  Result Date: 04/25/2016 CLINICAL DATA:  Abdominal bloating. Possible pancreatic mass on CT. History of rectal carcinoid tumor. EXAM: MRI ABDOMEN WITHOUT AND WITH CONTRAST TECHNIQUE: Multiplanar multisequence MR imaging of the abdomen was performed both before and after the administration of intravenous contrast. CONTRAST:  65mL MULTIHANCE GADOBENATE DIMEGLUMINE 529 MG/ML IV SOLN COMPARISON:  04/25/2016 and 12/13/2004 abdominopelvic CT. FINDINGS: Lower  chest:  The visualized lower chest appears unremarkable. Hepatobiliary: No suspicious hepatic findings. There are scattered small cysts which are stable. The gallbladder and biliary system appear normal. No evidence of biliary dilatation or choledocholithiasis. Pancreas: No evidence of pancreatic mass, surrounding inflammation or abnormal enhancement. The low-density seen within the pancreatic head is due to mild tortuosity of the main pancreatic duct which is not significantly dilated. Spleen: Normal in size without focal abnormality. Adrenals/Urinary Tract: Both adrenal glands appear normal. Both kidneys appear unremarkable. No hydronephrosis. Stomach/Bowel: Mildly dilated loops of small bowel are again noted within the left mid abdomen, as seen on earlier CT. There is a small amount of mesenteric fluid. No significant bowel wall thickening seen. Vascular/Lymphatic: There are no enlarged abdominal lymph nodes. Aortic and branch vessel atherosclerosis, better seen on CT. No evidence of large vessel occlusion. Other: None. Musculoskeletal: No acute or significant osseous findings. Lumbar spondylosis noted. IMPRESSION: 1. No suspicious pancreatic findings. The low-density in the pancreatic head on preceding CT attributed to tortuosity of the main pancreatic duct. 2. Persistent mid small bowel partial obstruction pattern. Electronically Signed   By: Richardean Sale M.D.   On: 04/25/2016 12:37   Ct Abdomen Pelvis W Contrast  Result Date: 04/25/2016 CLINICAL DATA:  Mid upper abdominal pain beginning today with nausea. Prior appendectomy. EXAM: CT ABDOMEN AND PELVIS WITH CONTRAST TECHNIQUE: Multidetector CT imaging of the abdomen and pelvis was performed using the standard protocol following bolus administration of intravenous contrast. CONTRAST:  145mL ISOVUE-300 IOPAMIDOL (ISOVUE-300) INJECTION 61% COMPARISON:  12/13/2004 FINDINGS: Lower chest: Subsegmental atelectasis in the lung bases. No pleural effusion.  Hepatobiliary: Hypoattenuating/ hypoenhancing liver lesions measures 3 mm in the posterior right hepatic lobe (series 301, image 9), 1.6 cm in the medial segment left hepatic lobe (series 301, image 20), and 10 mm more inferiorly in the right hepatic lobe (series 301, image 22), not significantly changed in size and compatible with a benign etiology. 12 mm low-density lesion in segment II on the prior CT is no longer clearly identified. The gallbladder is unremarkable. There is no biliary dilatation. Pancreas: 11 mm hypoattenuating lesion in the pancreatic head which appears new. No significant pancreatic ductal dilatation or atrophy. Spleen: Unremarkable. Adrenals/Urinary Tract: Unremarkable adrenal glands. No evidence of renal mass, calculi, or hydronephrosis. Decompressed bladder. Stomach/Bowel: The stomach is within normal limits. There is mild diffuse dilatation of proximal small bowel loops measuring up to approximately 3.2 cm in diameter with fecalized contents in the central abdomen. There is a transition to decompressed small bowel in the midline the central abdomen just below the level of the umbilicus (series [id number], image 47).  Scattered surgical clips are noted in this general region. There is prominent mucosal enhancement involving the transitional segment of small bowel at this location, without a discrete mass identified. The remainder of the more distal small bowel is decompressed. The colon is also largely decompressed, containing only a small amount of gas and stool. Colonic diverticulosis is noted without evidence of diverticulitis. Prior appendectomy. Vascular/Lymphatic: Abdominal aortic atherosclerosis without aneurysm. No enlarged lymph nodes. Reproductive: Uterus and bilateral adnexa are unremarkable. Other: No intraperitoneal free fluid. No abdominal wall mass or hernia. Musculoskeletal: Moderate lumbar disc and facet degeneration. IMPRESSION: 1. Small bowel obstruction with transition point in  the central abdomen just below the umbilicus, possibly secondary to adhesions. 2. **An incidental finding of potential clinical significance has been found. 11 mm low-density lesion in the pancreatic head, new from 2006 and indeterminate. Recommend follow-up contrast-enhanced abdominal MRI or pancreatic protocol CT in 2 years.** Study reviewed in person with Dr. Roxanne Mins on Q000111Q at 7:37 a.m. Electronically Signed   By: Logan Bores M.D.   On: 04/25/2016 08:12   Mr 3d Recon At Scanner  Result Date: 04/25/2016 CLINICAL DATA:  Abdominal bloating. Possible pancreatic mass on CT. History of rectal carcinoid tumor. EXAM: MRI ABDOMEN WITHOUT AND WITH CONTRAST TECHNIQUE: Multiplanar multisequence MR imaging of the abdomen was performed both before and after the administration of intravenous contrast. CONTRAST:  29mL MULTIHANCE GADOBENATE DIMEGLUMINE 529 MG/ML IV SOLN COMPARISON:  04/25/2016 and 12/13/2004 abdominopelvic CT. FINDINGS: Lower chest:  The visualized lower chest appears unremarkable. Hepatobiliary: No suspicious hepatic findings. There are scattered small cysts which are stable. The gallbladder and biliary system appear normal. No evidence of biliary dilatation or choledocholithiasis. Pancreas: No evidence of pancreatic mass, surrounding inflammation or abnormal enhancement. The low-density seen within the pancreatic head is due to mild tortuosity of the main pancreatic duct which is not significantly dilated. Spleen: Normal in size without focal abnormality. Adrenals/Urinary Tract: Both adrenal glands appear normal. Both kidneys appear unremarkable. No hydronephrosis. Stomach/Bowel: Mildly dilated loops of small bowel are again noted within the left mid abdomen, as seen on earlier CT. There is a small amount of mesenteric fluid. No significant bowel wall thickening seen. Vascular/Lymphatic: There are no enlarged abdominal lymph nodes. Aortic and branch vessel atherosclerosis, better seen on CT. No  evidence of large vessel occlusion. Other: None. Musculoskeletal: No acute or significant osseous findings. Lumbar spondylosis noted. IMPRESSION: 1. No suspicious pancreatic findings. The low-density in the pancreatic head on preceding CT attributed to tortuosity of the main pancreatic duct. 2. Persistent mid small bowel partial obstruction pattern. Electronically Signed   By: Richardean Sale M.D.   On: 04/25/2016 12:37   Dg Abd 2 Views  Result Date: 04/26/2016 CLINICAL DATA:  Re-evaluate small bowel obstruction. EXAM: ABDOMEN - 2 VIEW COMPARISON:  CT, 04/25/2016 FINDINGS: There are mildly dilated loops small bowel in the central abdomen, with scattered air-fluid levels on the erect view. Air is seen within a normal caliber colon and in the rectum. This is consistent with a partial small bowel obstruction, without significant change from the prior CT. No free air. Surgical vascular clips are noted across the central abdomen and in the pelvis. IMPRESSION: 1. Persistent partial small bowel obstruction without significant change from the recent prior CT scan. No free air. Electronically Signed   By: Lajean Manes M.D.   On: 04/26/2016 10:10   Dg Abd Portable 1v  Result Date: 04/26/2016 CLINICAL DATA:  Followup small bowel obstruction EXAM: PORTABLE ABDOMEN -  1 VIEW COMPARISON:  04/26/2016 FINDINGS: Scattered large and small bowel gas is noted. Mild prominence of the small bowel is again identified stable from the prior exam. No free air is seen. Nasogastric catheter is noted coiled within the stomach. Degenerative changes of lumbar spine are again noted. IMPRESSION: Stable small bowel prominence. Nasogastric catheter coiled within the stomach. Electronically Signed   By: Inez Catalina M.D.   On: 04/26/2016 18:06   Dg Abd Portable 2v  Result Date: 04/27/2016 CLINICAL DATA:  Small bowel obstruction EXAM: PORTABLE ABDOMEN - 2 VIEW COMPARISON:  04/26/2016 FINDINGS: NG tube coils in the stomach. Nonobstructive  bowel gas pattern. No free air organomegaly. IMPRESSION: No current evidence of bowel obstruction. NG tube remains coiled in the stomach. Electronically Signed   By: Rolm Baptise M.D.   On: 04/27/2016 07:51     PERTINENT LAB RESULTS: CBC:  Recent Labs  04/28/16 0527  WBC 5.6  HGB 9.9*  HCT 32.8*  PLT 219   CMET CMP     Component Value Date/Time   NA 139 04/28/2016 0527   NA 141 12/24/2015 1452   K 4.1 04/28/2016 0527   CL 107 04/28/2016 0527   CO2 25 04/28/2016 0527   GLUCOSE 62 (L) 04/28/2016 0527   BUN 21 (H) 04/28/2016 0527   BUN 19 12/24/2015 1452   CREATININE 0.81 04/28/2016 0527   CALCIUM 8.0 (L) 04/28/2016 0527   PROT 7.1 04/25/2016 0003   PROT 6.7 12/24/2015 1452   ALBUMIN 3.9 04/25/2016 0003   ALBUMIN 4.0 12/24/2015 1452   AST 24 04/25/2016 0003   ALT 16 04/25/2016 0003   ALKPHOS 68 04/25/2016 0003   BILITOT 0.6 04/25/2016 0003   BILITOT 0.4 12/24/2015 1452   GFRNONAA >60 04/28/2016 0527   GFRAA >60 04/28/2016 0527    GFR Estimated Creatinine Clearance: 53.9 mL/min (by C-G formula based on SCr of 0.81 mg/dL). No results for input(s): LIPASE, AMYLASE in the last 72 hours. No results for input(s): CKTOTAL, CKMB, CKMBINDEX, TROPONINI in the last 72 hours. Invalid input(s): POCBNP No results for input(s): DDIMER in the last 72 hours. No results for input(s): HGBA1C in the last 72 hours. No results for input(s): CHOL, HDL, LDLCALC, TRIG, CHOLHDL, LDLDIRECT in the last 72 hours. No results for input(s): TSH, T4TOTAL, T3FREE, THYROIDAB in the last 72 hours.  Invalid input(s): FREET3 No results for input(s): VITAMINB12, FOLATE, FERRITIN, TIBC, IRON, RETICCTPCT in the last 72 hours. Coags: No results for input(s): INR in the last 72 hours.  Invalid input(s): PT Microbiology: No results found for this or any previous visit (from the past 240 hour(s)).  FURTHER DISCHARGE INSTRUCTIONS:  Get Medicines reviewed and adjusted: Please take all your medications  with you for your next visit with your Primary MD  Laboratory/radiological data: Please request your Primary MD to go over all hospital tests and procedure/radiological results at the follow up, please ask your Primary MD to get all Hospital records sent to his/her office.  In some cases, they will be blood work, cultures and biopsy results pending at the time of your discharge. Please request that your primary care M.D. goes through all the records of your hospital data and follows up on these results.  Also Note the following: If you experience worsening of your admission symptoms, develop shortness of breath, life threatening emergency, suicidal or homicidal thoughts you must seek medical attention immediately by calling 911 or calling your MD immediately  if symptoms less severe.  You must  read complete instructions/literature along with all the possible adverse reactions/side effects for all the Medicines you take and that have been prescribed to you. Take any new Medicines after you have completely understood and accpet all the possible adverse reactions/side effects.   Do not drive when taking Pain medications or sleeping medications (Benzodaizepines)  Do not take more than prescribed Pain, Sleep and Anxiety Medications. It is not advisable to combine anxiety,sleep and pain medications without talking with your primary care practitioner  Special Instructions: If you have smoked or chewed Tobacco  in the last 2 yrs please stop smoking, stop any regular Alcohol  and or any Recreational drug use.  Wear Seat belts while driving.  Please note: You were cared for by a hospitalist during your hospital stay. Once you are discharged, your primary care physician will handle any further medical issues. Please note that NO REFILLS for any discharge medications will be authorized once you are discharged, as it is imperative that you return to your primary care physician (or establish a relationship  with a primary care physician if you do not have one) for your post hospital discharge needs so that they can reassess your need for medications and monitor your lab values.  Total Time spent coordinating discharge including counseling, education and face to face time equals 45 minutes.  Signed: Bevely Palmer, [place] 04/29/2016 12:12 PM  Attending MD note  Patient was seen, examined,treatment plan was discussed with the PA-S.  I have personally reviewed the clinical findings, lab, imaging studies and management of this patient in detail. I agree with the documentation, as recorded by the PA-S.   Patient is much better-had a large BM yesterday evening. No further abdominal pain. Tolerating advancement in diet  On Exam: Gen. exam: Awake, alert, not in any distress Chest: Good air entry bilaterally, no rhonchi or rales CVS: S1-S2 regular, no murmurs Abdomen: Soft, nontender and nondistended Neurology: Non-focal Skin: No rash or lesions  Impression: Resolved small bowel obstruction  Plan Home later today if she tolerates diet  Rest as above  Total time spent coordinating discharge equals 45 minutes  [facility]

## 2016-05-13 ENCOUNTER — Encounter: Payer: Self-pay | Admitting: Family Medicine

## 2016-05-13 ENCOUNTER — Ambulatory Visit (INDEPENDENT_AMBULATORY_CARE_PROVIDER_SITE_OTHER): Payer: Medicare Other | Admitting: Family Medicine

## 2016-05-13 ENCOUNTER — Ambulatory Visit (INDEPENDENT_AMBULATORY_CARE_PROVIDER_SITE_OTHER): Payer: Medicare Other

## 2016-05-13 VITALS — BP 131/69 | HR 97 | Temp 97.1°F | Ht 63.0 in | Wt 149.0 lb

## 2016-05-13 DIAGNOSIS — E785 Hyperlipidemia, unspecified: Secondary | ICD-10-CM | POA: Diagnosis not present

## 2016-05-13 DIAGNOSIS — M79605 Pain in left leg: Secondary | ICD-10-CM

## 2016-05-13 DIAGNOSIS — M8580 Other specified disorders of bone density and structure, unspecified site: Secondary | ICD-10-CM | POA: Diagnosis not present

## 2016-05-13 DIAGNOSIS — K56609 Unspecified intestinal obstruction, unspecified as to partial versus complete obstruction: Secondary | ICD-10-CM

## 2016-05-13 DIAGNOSIS — G2581 Restless legs syndrome: Secondary | ICD-10-CM | POA: Diagnosis not present

## 2016-05-13 DIAGNOSIS — I10 Essential (primary) hypertension: Secondary | ICD-10-CM

## 2016-05-13 MED ORDER — PREGABALIN 75 MG PO CAPS: 75.0000 mg | ORAL_CAPSULE | Freq: Every day | ORAL | 5 refills | Status: DC

## 2016-05-13 MED ORDER — TRIAMTERENE-HCTZ 37.5-25 MG PO TABS: 1.0000 | ORAL_TABLET | Freq: Every day | ORAL | 3 refills | Status: DC

## 2016-05-13 MED ORDER — ATORVASTATIN CALCIUM 40 MG PO TABS: 40.0000 mg | ORAL_TABLET | Freq: Every day | ORAL | 1 refills | Status: DC

## 2016-05-13 MED ORDER — AMLODIPINE BESYLATE 10 MG PO TABS: 10.0000 mg | ORAL_TABLET | Freq: Every day | ORAL | 1 refills | Status: DC

## 2016-05-13 MED ORDER — ROPINIROLE HCL 0.5 MG PO TABS: 0.5000 mg | ORAL_TABLET | Freq: Every day | ORAL | 5 refills | Status: DC

## 2016-05-13 MED ORDER — RALOXIFENE HCL 60 MG PO TABS: 60.0000 mg | ORAL_TABLET | Freq: Every day | ORAL | 1 refills | Status: DC

## 2016-05-13 NOTE — Progress Notes (Signed)
Subjective:  Patient ID: Anita Perez, female    DOB: [birth year]  Age: 81 y.o. MRN: [id number]  CC: Hospitalization Follow-up (pt here today after being hospitalized for intestinal blockage)   HPI Anita Perez presents for Recheck of abd. Discomfort and recent hospitalization for SBO. Sx have resolved. Now taking regular diet. Bowels were normal throughout the illness. No nausea or vomiting at this time. Pain had been periumbilical. Is now resolved.  Pt. Also concerned that LLE pain & numbness has been building since before hospitalization. It is primarily the left shin and ankle. She is able to ambulate. Denies edema. Sx are mild. She has a history of restless leg syndrome. She has used were quit successfully in the past. This does not seem the same. It is not a cramping but more of an ache with numbness.   follow-up of hypertension. Patient has no history of headache chest pain or shortness of breath or recent cough. Patient also denies symptoms of TIA such as numbness weakness lateralizing. Patient checks  blood pressure at home and has not had any elevated readings recently. Patient denies side effects from his medication. States taking it regularly.  Patient in for follow-up of elevated cholesterol. Doing well without complaints on current medication. Denies side effects of statin including myalgia and arthralgia and nausea. Also in today for liver function testing. Currently no chest pain, shortness of breath or other cardiovascular related symptoms noted.    [name] has a past medical history of Arthritis; Colon polyps; DDD (degenerative disc disease), lumbar; Diverticulitis (1990s); GERD (gastroesophageal reflux disease); Hiatal hernia; Hyperlipidemia; Hypertension; Osteopenia; Rectal carcinoma ([place]); and Small bowel obstruction (04/25/2016).   She has a past surgical history that includes intestinal  tear; Appendectomy; Cosmetic surgery (Left); Lumbar  laminectomy/decompression microdiscectomy (Left, 06/14/2013); Knee arthroscopy (Right); Tumor removal (Left); Back surgery; Breast cyst excision (Left); Colostomy (1990s); Colostomy takedown (1990s); Colonoscopy w/ biopsies and polypectomy; and Colon surgery.   Her family history includes Cancer in her sister and sister; Early death in her brother; Hypertension in her father and mother; Kidney disease in her father and mother.She reports that she quit smoking about 33 years ago. Her smoking use included Cigarettes. She has a 2.88 pack-year smoking history. She quit smokeless tobacco use about 70 years ago. Her smokeless tobacco use included Snuff. She reports that she drinks alcohol. She reports that she does not use drugs.    ROS Review of Systems  Constitutional: Negative for activity change, appetite change and fever.  HENT: Negative for congestion, rhinorrhea and sore throat.   Eyes: Negative for visual disturbance.  Respiratory: Negative for cough and shortness of breath.   Cardiovascular: Negative for chest pain and palpitations.  Gastrointestinal: Negative for abdominal pain, diarrhea and nausea.  Genitourinary: Negative for dysuria.  Musculoskeletal: Positive for arthralgias and myalgias.  Neurological: Positive for numbness.    Objective:  BP 131/69   Pulse 97   Temp 97.1 F (36.2 C) (Oral)   Ht _0  (1.6 m)   Wt 149 lb (67.6 kg)   BMI 26.39 kg/m   BP Readings from Last 3 Encounters:  05/13/16 131/69  04/29/16 (!) 160/66  12/24/15 125/70    Wt Readings from Last 3 Encounters:  05/13/16 149 lb (67.6 kg)  04/28/16 154 lb 14.4 oz (70.3 kg)  12/24/15 154 lb 9.6 oz (70.1 kg)     Physical Exam  Constitutional: She is oriented to person, place, and time. She appears well-developed and well-nourished. No  distress.  HENT:  Head: Normocephalic and atraumatic.  Right Ear: External ear normal.  Left Ear: External ear normal.  Nose: Nose normal.  Mouth/Throat: Oropharynx  is clear and moist.  Eyes: Conjunctivae and EOM are normal. Pupils are equal, round, and reactive to light.  Neck: Normal range of motion. Neck supple. No thyromegaly present.  Cardiovascular: Normal rate, regular rhythm and normal heart sounds.   No murmur heard. Pulmonary/Chest: Effort normal and breath sounds normal. No respiratory distress. She has no wheezes. She has no rales.  Abdominal: Soft. Bowel sounds are normal. She exhibits no distension. There is no tenderness.  Lymphadenopathy:    She has no cervical adenopathy.  Neurological: She is alert and oriented to person, place, and time. She has normal reflexes.  Skin: Skin is warm and dry.  Psychiatric: She has a normal mood and affect. Her behavior is normal. Judgment and thought content normal.    Mr Abdomen W Wo Contrast  Result Date: 04/25/2016 CLINICAL DATA:  Abdominal bloating. Possible pancreatic mass on CT. History of rectal carcinoid tumor. EXAM: MRI ABDOMEN WITHOUT AND WITH CONTRAST TECHNIQUE: Multiplanar multisequence MR imaging of the abdomen was performed both before and after the administration of intravenous contrast. CONTRAST:  43m MULTIHANCE GADOBENATE DIMEGLUMINE 529 MG/ML IV SOLN COMPARISON:  04/25/2016 and 12/13/2004 abdominopelvic CT. FINDINGS: Lower chest:  The visualized lower chest appears unremarkable. Hepatobiliary: No suspicious hepatic findings. There are scattered small cysts which are stable. The gallbladder and biliary system appear normal. No evidence of biliary dilatation or choledocholithiasis. Pancreas: No evidence of pancreatic mass, surrounding inflammation or abnormal enhancement. The low-density seen within the pancreatic head is due to mild tortuosity of the main pancreatic duct which is not significantly dilated. Spleen: Normal in size without focal abnormality. Adrenals/Urinary Tract: Both adrenal glands appear normal. Both kidneys appear unremarkable. No hydronephrosis. Stomach/Bowel: Mildly dilated  loops of small bowel are again noted within the left mid abdomen, as seen on earlier CT. There is a small amount of mesenteric fluid. No significant bowel wall thickening seen. Vascular/Lymphatic: There are no enlarged abdominal lymph nodes. Aortic and branch vessel atherosclerosis, better seen on CT. No evidence of large vessel occlusion. Other: None. Musculoskeletal: No acute or significant osseous findings. Lumbar spondylosis noted. IMPRESSION: 1. No suspicious pancreatic findings. The low-density in the pancreatic head on preceding CT attributed to tortuosity of the main pancreatic duct. 2. Persistent mid small bowel partial obstruction pattern. Electronically Signed   By: WRichardean SaleM.D.   On: 04/25/2016 12:37   Ct Abdomen Pelvis W Contrast  Result Date: 04/25/2016 CLINICAL DATA:  Mid upper abdominal pain beginning today with nausea. Prior appendectomy. EXAM: CT ABDOMEN AND PELVIS WITH CONTRAST TECHNIQUE: Multidetector CT imaging of the abdomen and pelvis was performed using the standard protocol following bolus administration of intravenous contrast. CONTRAST:  1029mISOVUE-300 IOPAMIDOL (ISOVUE-300) INJECTION 61% COMPARISON:  12/13/2004 FINDINGS: Lower chest: Subsegmental atelectasis in the lung bases. No pleural effusion. Hepatobiliary: Hypoattenuating/ hypoenhancing liver lesions measures 3 mm in the posterior right hepatic lobe (series 301, image 9), 1.6 cm in the medial segment left hepatic lobe (series 301, image 20), and 10 mm more inferiorly in the right hepatic lobe (series 301, image 22), not significantly changed in size and compatible with a benign etiology. 12 mm low-density lesion in segment II on the prior CT is no longer clearly identified. The gallbladder is unremarkable. There is no biliary dilatation. Pancreas: 11 mm hypoattenuating lesion in the pancreatic head which appears new.  No significant pancreatic ductal dilatation or atrophy. Spleen: Unremarkable. Adrenals/Urinary Tract:  Unremarkable adrenal glands. No evidence of renal mass, calculi, or hydronephrosis. Decompressed bladder. Stomach/Bowel: The stomach is within normal limits. There is mild diffuse dilatation of proximal small bowel loops measuring up to approximately 3.2 cm in diameter with fecalized contents in the central abdomen. There is a transition to decompressed small bowel in the midline the central abdomen just below the level of the umbilicus (series 314, image 47). Scattered surgical clips are noted in this general region. There is prominent mucosal enhancement involving the transitional segment of small bowel at this location, without a discrete mass identified. The remainder of the more distal small bowel is decompressed. The colon is also largely decompressed, containing only a small amount of gas and stool. Colonic diverticulosis is noted without evidence of diverticulitis. Prior appendectomy. Vascular/Lymphatic: Abdominal aortic atherosclerosis without aneurysm. No enlarged lymph nodes. Reproductive: Uterus and bilateral adnexa are unremarkable. Other: No intraperitoneal free fluid. No abdominal wall mass or hernia. Musculoskeletal: Moderate lumbar disc and facet degeneration. IMPRESSION: 1. Small bowel obstruction with transition point in the central abdomen just below the umbilicus, possibly secondary to adhesions. 2. **An incidental finding of potential clinical significance has been found. 11 mm low-density lesion in the pancreatic head, new from 2006 and indeterminate. Recommend follow-up contrast-enhanced abdominal MRI or pancreatic protocol CT in 2 years.** Study reviewed in person with Dr. Roxanne Mins on 97/05/6376 at 7:37 a.m. Electronically Signed   By: Logan Bores M.D.   On: 04/25/2016 08:12   Mr 3d Recon At Scanner  Result Date: 04/25/2016 CLINICAL DATA:  Abdominal bloating. Possible pancreatic mass on CT. History of rectal carcinoid tumor. EXAM: MRI ABDOMEN WITHOUT AND WITH CONTRAST TECHNIQUE:  Multiplanar multisequence MR imaging of the abdomen was performed both before and after the administration of intravenous contrast. CONTRAST:  68m MULTIHANCE GADOBENATE DIMEGLUMINE 529 MG/ML IV SOLN COMPARISON:  04/25/2016 and 12/13/2004 abdominopelvic CT. FINDINGS: Lower chest:  The visualized lower chest appears unremarkable. Hepatobiliary: No suspicious hepatic findings. There are scattered small cysts which are stable. The gallbladder and biliary system appear normal. No evidence of biliary dilatation or choledocholithiasis. Pancreas: No evidence of pancreatic mass, surrounding inflammation or abnormal enhancement. The low-density seen within the pancreatic head is due to mild tortuosity of the main pancreatic duct which is not significantly dilated. Spleen: Normal in size without focal abnormality. Adrenals/Urinary Tract: Both adrenal glands appear normal. Both kidneys appear unremarkable. No hydronephrosis. Stomach/Bowel: Mildly dilated loops of small bowel are again noted within the left mid abdomen, as seen on earlier CT. There is a small amount of mesenteric fluid. No significant bowel wall thickening seen. Vascular/Lymphatic: There are no enlarged abdominal lymph nodes. Aortic and branch vessel atherosclerosis, better seen on CT. No evidence of large vessel occlusion. Other: None. Musculoskeletal: No acute or significant osseous findings. Lumbar spondylosis noted. IMPRESSION: 1. No suspicious pancreatic findings. The low-density in the pancreatic head on preceding CT attributed to tortuosity of the main pancreatic duct. 2. Persistent mid small bowel partial obstruction pattern. Electronically Signed   By: WRichardean SaleM.D.   On: 04/25/2016 12:37   Dg Abd 2 Views  Result Date: 04/26/2016 CLINICAL DATA:  Re-evaluate small bowel obstruction. EXAM: ABDOMEN - 2 VIEW COMPARISON:  CT, 04/25/2016 FINDINGS: There are mildly dilated loops small bowel in the central abdomen, with scattered air-fluid levels on  the erect view. Air is seen within a normal caliber colon and in the rectum. This is consistent with  a partial small bowel obstruction, without significant change from the prior CT. No free air. Surgical vascular clips are noted across the central abdomen and in the pelvis. IMPRESSION: 1. Persistent partial small bowel obstruction without significant change from the recent prior CT scan. No free air. Electronically Signed   By: Lajean Manes M.D.   On: 04/26/2016 10:10   Dg Abd Portable 1v  Result Date: 04/26/2016 CLINICAL DATA:  Followup small bowel obstruction EXAM: PORTABLE ABDOMEN - 1 VIEW COMPARISON:  04/26/2016 FINDINGS: Scattered large and small bowel gas is noted. Mild prominence of the small bowel is again identified stable from the prior exam. No free air is seen. Nasogastric catheter is noted coiled within the stomach. Degenerative changes of lumbar spine are again noted. IMPRESSION: Stable small bowel prominence. Nasogastric catheter coiled within the stomach. Electronically Signed   By: Inez Catalina M.D.   On: 04/26/2016 18:06    Assessment & Plan:   Anita Perez was seen today for hospitalization follow-up.  Diagnoses and all orders for this visit:  Essential hypertension, benign -     CMP14+EGFR -     CBC with Differential/Platelet  Hyperlipidemia with target LDL less than 100 -     CMP14+EGFR -     CBC with Differential/Platelet -     atorvastatin (LIPITOR) 40 MG tablet; Take 1 tablet (40 mg total) by mouth daily. -     Lipid panel  SBO (small bowel obstruction) -     CMP14+EGFR -     CBC with Differential/Platelet  Restless leg syndrome -     CMP14+EGFR -     CBC with Differential/Platelet  Osteopenia determined by x-ray -     CMP14+EGFR -     CBC with Differential/Platelet -     raloxifene (EVISTA) 60 MG tablet; Take 1 tablet (60 mg total) by mouth daily. For bone health and breast cancer prevention  Leg pain, anterior, left -     CMP14+EGFR -     CBC with  Differential/Platelet -     DG Tibia/Fibula Left; Future  Other orders -     amLODipine (NORVASC) 10 MG tablet; Take 1 tablet (10 mg total) by mouth daily. -     triamterene-hydrochlorothiazide (MAXZIDE-25) 37.5-25 MG tablet; Take 1 tablet by mouth daily. -     rOPINIRole (REQUIP) 0.5 MG tablet; Take 1 tablet (0.5 mg total) by mouth at bedtime. -     pregabalin (LYRICA) 75 MG capsule; Take 1 capsule (75 mg total) by mouth daily. For leg pain      I have changed Ms. Ebner's triamterene-hydrochlorothiazide. I am also having her maintain her aspirin, Calcium Carbonate-Vitamin D (CALCIUM + D PO), Vitamin D (Ergocalciferol), amLODipine, rOPINIRole, atorvastatin, raloxifene, and pregabalin.  Allergies as of 05/13/2016      Reactions   Pepto-bismol [bismuth] Other (See Comments)   Turned mouth black inside      Medication List       Accurate as of 05/13/16  9:30 PM. Always use your most recent med list.          amLODipine 10 MG tablet Commonly known as:  NORVASC Take 1 tablet (10 mg total) by mouth daily.   aspirin 81 MG tablet Take 81 mg by mouth daily.   atorvastatin 40 MG tablet Commonly known as:  LIPITOR Take 1 tablet (40 mg total) by mouth daily.   CALCIUM + D PO Take 600 mg by mouth daily.   pregabalin 75 MG capsule Commonly  known as:  LYRICA Take 1 capsule (75 mg total) by mouth daily. For leg pain   raloxifene 60 MG tablet Commonly known as:  EVISTA Take 1 tablet (60 mg total) by mouth daily. For bone health and breast cancer prevention   rOPINIRole 0.5 MG tablet Commonly known as:  REQUIP Take 1 tablet (0.5 mg total) by mouth at bedtime.   triamterene-hydrochlorothiazide 37.5-25 MG tablet Commonly known as:  MAXZIDE-25 Take 1 tablet by mouth daily.   Vitamin D (Ergocalciferol) 50000 units Caps capsule Commonly known as:  DRISDOL Take 1 capsule (50,000 Units total) by mouth 2 (two) times a week.        Follow-up: Return in about 6 months (around  11/10/2016).  Claretta Fraise, M.D.

## 2016-05-14 LAB — CMP14+EGFR
ALBUMIN: 4.3 g/dL (ref 3.5–4.8)
ALT: 17 IU/L (ref 0–32)
AST: 19 IU/L (ref 0–40)
Albumin/Globulin Ratio: 1.6 (ref 1.2–2.2)
Alkaline Phosphatase: 86 IU/L (ref 39–117)
BUN/Creatinine Ratio: 18 (ref 12–28)
BUN: 18 mg/dL (ref 8–27)
Bilirubin Total: 0.5 mg/dL (ref 0.0–1.2)
CO2: 27 mmol/L (ref 18–29)
CREATININE: 1.02 mg/dL — AB (ref 0.57–1.00)
Calcium: 9.6 mg/dL (ref 8.7–10.3)
Chloride: 98 mmol/L (ref 96–106)
GFR calc Af Amer: 61 mL/min/{1.73_m2} (ref >59)
GFR, EST NON AFRICAN AMERICAN: 53 mL/min/{1.73_m2} — AB (ref >59)
GLOBULIN, TOTAL: 2.7 g/dL (ref 1.5–4.5)
Glucose: 101 mg/dL — ABNORMAL HIGH (ref 65–99)
Potassium: 3.6 mmol/L (ref 3.5–5.2)
SODIUM: 141 mmol/L (ref 134–144)
TOTAL PROTEIN: 7 g/dL (ref 6.0–8.5)

## 2016-05-14 LAB — CBC WITH DIFFERENTIAL/PLATELET
BASOS: 1 %
Basophils Absolute: 0.1 10*3/uL (ref 0.0–0.2)
EOS (ABSOLUTE): 0.1 10*3/uL (ref 0.0–0.4)
EOS: 1 %
HEMATOCRIT: 35.7 % (ref 34.0–46.6)
Hemoglobin: 11.5 g/dL (ref 11.1–15.9)
IMMATURE GRANULOCYTES: 0 %
Immature Grans (Abs): 0 10*3/uL (ref 0.0–0.1)
LYMPHS ABS: 1.8 10*3/uL (ref 0.7–3.1)
Lymphs: 22 %
MCH: 27.6 pg (ref 26.6–33.0)
MCHC: 32.2 g/dL (ref 31.5–35.7)
MCV: 86 fL (ref 79–97)
MONOS ABS: 0.7 10*3/uL (ref 0.1–0.9)
Monocytes: 9 %
NEUTROS PCT: 67 %
Neutrophils Absolute: 5.5 10*3/uL (ref 1.4–7.0)
Platelets: 355 10*3/uL (ref 150–379)
RBC: 4.17 x10E6/uL (ref 3.77–5.28)
RDW: 14.9 % (ref 12.3–15.4)
WBC: 8.1 10*3/uL (ref 3.4–10.8)

## 2016-05-14 LAB — LIPID PANEL
CHOL/HDL RATIO: 3.6 ratio (ref 0.0–4.4)
Cholesterol, Total: 162 mg/dL (ref 100–199)
HDL: 45 mg/dL (ref >39)
LDL Calculated: 93 mg/dL (ref 0–99)
Triglycerides: 120 mg/dL (ref 0–149)
VLDL Cholesterol Cal: 24 mg/dL (ref 5–40)

## 2016-05-14 NOTE — Progress Notes (Signed)
Please contact the patient regarding: The x-ray of your leg turned out normal.

## 2016-08-27 ENCOUNTER — Encounter: Payer: Medicare Other | Admitting: *Deleted

## 2016-08-28 ENCOUNTER — Telehealth: Payer: Self-pay | Admitting: Family Medicine

## 2016-08-29 NOTE — Telephone Encounter (Signed)
Scheduled

## 2016-11-07 DIAGNOSIS — Z1231 Encounter for screening mammogram for malignant neoplasm of breast: Secondary | ICD-10-CM | POA: Diagnosis not present

## 2016-11-10 ENCOUNTER — Encounter: Payer: Self-pay | Admitting: Family Medicine

## 2016-11-10 ENCOUNTER — Ambulatory Visit: Payer: Medicare Other | Admitting: Family Medicine

## 2016-11-10 ENCOUNTER — Encounter (INDEPENDENT_AMBULATORY_CARE_PROVIDER_SITE_OTHER): Payer: Self-pay

## 2016-11-10 ENCOUNTER — Ambulatory Visit (INDEPENDENT_AMBULATORY_CARE_PROVIDER_SITE_OTHER): Payer: Medicare Other | Admitting: Family Medicine

## 2016-11-10 VITALS — BP 135/74 | HR 72 | Temp 97.3°F | Ht 63.0 in | Wt 150.0 lb

## 2016-11-10 DIAGNOSIS — M8589 Other specified disorders of bone density and structure, multiple sites: Secondary | ICD-10-CM | POA: Diagnosis not present

## 2016-11-10 DIAGNOSIS — M8580 Other specified disorders of bone density and structure, unspecified site: Secondary | ICD-10-CM

## 2016-11-10 DIAGNOSIS — M5126 Other intervertebral disc displacement, lumbar region: Secondary | ICD-10-CM

## 2016-11-10 DIAGNOSIS — E785 Hyperlipidemia, unspecified: Secondary | ICD-10-CM

## 2016-11-10 DIAGNOSIS — G629 Polyneuropathy, unspecified: Secondary | ICD-10-CM

## 2016-11-10 DIAGNOSIS — E559 Vitamin D deficiency, unspecified: Secondary | ICD-10-CM | POA: Diagnosis not present

## 2016-11-10 DIAGNOSIS — G2581 Restless legs syndrome: Secondary | ICD-10-CM | POA: Diagnosis not present

## 2016-11-10 DIAGNOSIS — I10 Essential (primary) hypertension: Secondary | ICD-10-CM

## 2016-11-10 MED ORDER — AMLODIPINE BESYLATE 10 MG PO TABS: 10.0000 mg | ORAL_TABLET | Freq: Every day | ORAL | 1 refills | Status: DC

## 2016-11-10 MED ORDER — RALOXIFENE HCL 60 MG PO TABS: 60.0000 mg | ORAL_TABLET | Freq: Every day | ORAL | 1 refills | Status: DC

## 2016-11-10 MED ORDER — ATORVASTATIN CALCIUM 40 MG PO TABS: 40.0000 mg | ORAL_TABLET | Freq: Every day | ORAL | 1 refills | Status: DC

## 2016-11-10 MED ORDER — ROPINIROLE HCL 1 MG PO TABS: 1.0000 mg | ORAL_TABLET | Freq: Three times a day (TID) | ORAL | 1 refills | Status: DC

## 2016-11-10 MED ORDER — PREGABALIN 50 MG PO CAPS: ORAL_CAPSULE | ORAL | 5 refills | Status: DC

## 2016-11-10 MED ORDER — ROPINIROLE HCL 0.5 MG PO TABS: 0.5000 mg | ORAL_TABLET | Freq: Every day | ORAL | 1 refills | Status: DC

## 2016-11-10 NOTE — Progress Notes (Signed)
Subjective:  Patient ID: Anita Perez, female    DOB: Dec 09, 1935  Age: 81 y.o. MRN: [id number]  CC: Hypertension (pt here today f/u on her chronic medical conditions and c/o left lower leg numbness but she couldn't take the Lyrica because it made her dizzy.)   HPI [name] presents for  follow-up of hypertension. Patient has no history of headache chest pain or shortness of breath or recent cough. Patient also denies symptoms of TIA such as numbness weakness lateralizing. Patient checks  blood pressure at home and has not had any elevated readings recently. Patient denies side effects from medication. States taking it regularly.  Patient also reports that she's having numbness and tingling in the left leg just a little bit in the right. She continues to have a lot of cramping at night and she is trying some over-the-counter remedies for that in addition to her ropinirole. Additionally she does have the herniated disc and is having some aching and soreness down the left leg as well. She's not been taking meloxicam regularly.  Patient is taking the raloxifene without any complications. She reports she is taking her calcium as recommended.  Follow-up cholesterolCurrently stable on atorvastatin without myalgia in general other than that listed above. History Abrish has a past medical history of Arthritis; Colon polyps; DDD (degenerative disc disease), lumbar; Diverticulitis (1990s); GERD (gastroesophageal reflux disease); Hiatal hernia; Hyperlipidemia; Hypertension; Osteopenia; Rectal carcinoma ([place]); and Small bowel obstruction ([place]) (04/25/2016).   She has a past surgical history that includes intestinal  tear; Appendectomy; Cosmetic surgery (Left); Lumbar laminectomy/decompression microdiscectomy (Left, 06/14/2013); Knee arthroscopy (Right); Tumor removal (Left); Back surgery; Breast cyst excision (Left); Colostomy (1990s); Colostomy takedown (1990s); Colonoscopy w/ biopsies and  polypectomy; and Colon surgery.   Her family history includes Cancer in her sister and sister; Early death in her brother; Hypertension in her father and mother; Kidney disease in her father and mother.She reports that she quit smoking about 34 years ago. Her smoking use included Cigarettes. She has a 2.88 pack-year smoking history. She quit smokeless tobacco use about 70 years ago. Her smokeless tobacco use included Snuff. She reports that she drinks alcohol. She reports that she does not use drugs.  Current Outpatient Prescriptions on File Prior to Visit  Medication Sig Dispense Refill  . aspirin 81 MG tablet Take 81 mg by mouth daily.    . Calcium Carbonate-Vitamin D (CALCIUM + D PO) Take 600 mg by mouth daily.    Marland Kitchen triamterene-hydrochlorothiazide (MAXZIDE-25) 37.5-25 MG tablet Take 1 tablet by mouth daily. 90 tablet 3  . Vitamin D, Ergocalciferol, (DRISDOL) 50000 units CAPS capsule Take 1 capsule (50,000 Units total) by mouth 2 (two) times a week. 16 capsule 0   No current facility-administered medications on file prior to visit.     ROS Review of Systems  Constitutional: Negative for activity change, appetite change and fever.  HENT: Negative for congestion, rhinorrhea and sore throat.   Eyes: Negative for visual disturbance.  Respiratory: Negative for cough and shortness of breath.   Cardiovascular: Negative for chest pain and palpitations.  Gastrointestinal: Negative for abdominal pain, diarrhea and nausea.  Genitourinary: Negative for dysuria.  Musculoskeletal: Positive for arthralgias, back pain and myalgias.    Objective:  BP 135/74   Pulse 72   Temp (!) 97.3 F (36.3 C) (Oral)   Ht _0  (1.6 m)   Wt 150 lb (68 kg)   BMI 26.57 kg/m   BP Readings from Last 3 Encounters:  11/10/16 135/74  05/13/16 131/69  04/29/16 (!) 160/66    Wt Readings from Last 3 Encounters:  11/10/16 150 lb (68 kg)  05/13/16 149 lb (67.6 kg)  04/28/16 154 lb 14.4 oz (70.3 kg)      Physical Exam  Constitutional: She is oriented to person, place, and time. She appears well-developed and well-nourished. No distress.  HENT:  Head: Normocephalic and atraumatic.  Right Ear: External ear normal.  Left Ear: External ear normal.  Nose: Nose normal.  Mouth/Throat: Oropharynx is clear and moist.  Eyes: Pupils are equal, round, and reactive to light. Conjunctivae and EOM are normal.  Neck: Normal range of motion. Neck supple. No thyromegaly present.  Cardiovascular: Normal rate, regular rhythm and normal heart sounds.   No murmur heard. Pulmonary/Chest: Effort normal and breath sounds normal. No respiratory distress. She has no wheezes. She has no rales.  Abdominal: Soft. Bowel sounds are normal. She exhibits no distension. There is no tenderness.  Lymphadenopathy:    She has no cervical adenopathy.  Neurological: She is alert and oriented to person, place, and time. She has normal reflexes.  Skin: Skin is warm and dry.  Psychiatric: She has a normal mood and affect. Her behavior is normal. Judgment and thought content normal.     Lab Results  Component Value Date   WBC 8.1 05/13/2016   HGB 11.5 05/13/2016   HCT 35.7 05/13/2016   PLT 355 05/13/2016   GLUCOSE 101 (H) 05/13/2016   CHOL 162 05/13/2016   TRIG 120 05/13/2016   HDL 45 05/13/2016   LDLCALC 93 05/13/2016   ALT 17 05/13/2016   AST 19 05/13/2016   NA 141 05/13/2016   K 3.6 05/13/2016   CL 98 05/13/2016   CREATININE 1.02 (H) 05/13/2016   BUN 18 05/13/2016   CO2 27 05/13/2016     Assessment & Plan:   Youa was seen today for hypertension.  Diagnoses and all orders for this visit:  Essential hypertension, benign -     CMP14+EGFR  Hyperlipidemia with target LDL less than 100 -     Discontinue: atorvastatin (LIPITOR) 40 MG tablet; Take 1 tablet (40 mg total) by mouth daily. -     Lipid panel -     atorvastatin (LIPITOR) 40 MG tablet; Take 1 tablet (40 mg total) by mouth  daily.  Neuropathy ([place])  Vitamin D deficiency  Restless leg syndrome  Lumbar disc herniation  Osteopenia of multiple sites  Osteopenia determined by x-ray -     Discontinue: raloxifene (EVISTA) 60 MG tablet; Take 1 tablet (60 mg total) by mouth daily. For bone health and breast cancer prevention -     raloxifene (EVISTA) 60 MG tablet; Take 1 tablet (60 mg total) by mouth daily. For bone health and breast cancer prevention  Other orders -     Discontinue: amLODipine (NORVASC) 10 MG tablet; Take 1 tablet (10 mg total) by mouth daily. -     Discontinue: rOPINIRole (REQUIP) 0.5 MG tablet; Take 1 tablet (0.5 mg total) by mouth at bedtime. -     pregabalin (LYRICA) 50 MG capsule; 1 qhs X7 days , then 2 qhs X 7d, then 3 qhs X 7d, then 4 qhs -     amLODipine (NORVASC) 10 MG tablet; Take 1 tablet (10 mg total) by mouth daily. -     rOPINIRole (REQUIP) 1 MG tablet; Take 1 tablet (1 mg total) by mouth 3 (three) times daily.   I have discontinued Ms. Preis's  rOPINIRole, pregabalin, and rOPINIRole. I am also having her start on pregabalin and rOPINIRole. Additionally, I am having her maintain her aspirin, Calcium Carbonate-Vitamin D (CALCIUM + D PO), Vitamin D (Ergocalciferol), triamterene-hydrochlorothiazide, atorvastatin, raloxifene, and amLODipine.  Meds ordered this encounter  Medications  . DISCONTD: atorvastatin (LIPITOR) 40 MG tablet    Sig: Take 1 tablet (40 mg total) by mouth daily.    Dispense:  90 tablet    Refill:  1  . DISCONTD: raloxifene (EVISTA) 60 MG tablet    Sig: Take 1 tablet (60 mg total) by mouth daily. For bone health and breast cancer prevention    Dispense:  90 tablet    Refill:  1  . DISCONTD: amLODipine (NORVASC) 10 MG tablet    Sig: Take 1 tablet (10 mg total) by mouth daily.    Dispense:  90 tablet    Refill:  1  . DISCONTD: rOPINIRole (REQUIP) 0.5 MG tablet    Sig: Take 1 tablet (0.5 mg total) by mouth at bedtime.    Dispense:  90 tablet    Refill:  1  .  pregabalin (LYRICA) 50 MG capsule    Sig: 1 qhs X7 days , then 2 qhs X 7d, then 3 qhs X 7d, then 4 qhs    Dispense:  120 capsule    Refill:  5  . atorvastatin (LIPITOR) 40 MG tablet    Sig: Take 1 tablet (40 mg total) by mouth daily.    Dispense:  90 tablet    Refill:  1  . raloxifene (EVISTA) 60 MG tablet    Sig: Take 1 tablet (60 mg total) by mouth daily. For bone health and breast cancer prevention    Dispense:  90 tablet    Refill:  1  . amLODipine (NORVASC) 10 MG tablet    Sig: Take 1 tablet (10 mg total) by mouth daily.    Dispense:  90 tablet    Refill:  1  . rOPINIRole (REQUIP) 1 MG tablet    Sig: Take 1 tablet (1 mg total) by mouth 3 (three) times daily.    Dispense:  90 tablet    Refill:  1  Lyrica titrated.  ropinirole increase authorized.   Follow-up: Return in about 6 months (around 05/13/2017).  Claretta Fraise, M.D.

## 2016-11-11 LAB — CMP14+EGFR
A/G RATIO: 1.5 (ref 1.2–2.2)
ALK PHOS: 86 IU/L (ref 39–117)
ALT: 11 IU/L (ref 0–32)
AST: 22 IU/L (ref 0–40)
Albumin: 4.2 g/dL (ref 3.5–4.8)
BILIRUBIN TOTAL: 0.5 mg/dL (ref 0.0–1.2)
BUN / CREAT RATIO: 23 (ref 12–28)
BUN: 22 mg/dL (ref 8–27)
CO2: 23 mmol/L (ref 20–29)
Calcium: 9.3 mg/dL (ref 8.7–10.3)
Chloride: 105 mmol/L (ref 96–106)
Creatinine, Ser: 0.94 mg/dL (ref 0.57–1.00)
GFR calc Af Amer: 67 mL/min/{1.73_m2} (ref >59)
GFR calc non Af Amer: 58 mL/min/{1.73_m2} — ABNORMAL LOW (ref >59)
GLUCOSE: 94 mg/dL (ref 65–99)
Globulin, Total: 2.8 g/dL (ref 1.5–4.5)
POTASSIUM: 4.3 mmol/L (ref 3.5–5.2)
Sodium: 141 mmol/L (ref 134–144)
Total Protein: 7 g/dL (ref 6.0–8.5)

## 2016-11-11 LAB — LIPID PANEL
CHOL/HDL RATIO: 3.2 ratio (ref 0.0–4.4)
Cholesterol, Total: 168 mg/dL (ref 100–199)
HDL: 52 mg/dL (ref >39)
LDL Calculated: 95 mg/dL (ref 0–99)
TRIGLYCERIDES: 105 mg/dL (ref 0–149)
VLDL CHOLESTEROL CAL: 21 mg/dL (ref 5–40)

## 2017-01-13 ENCOUNTER — Ambulatory Visit: Payer: Medicare Other | Admitting: *Deleted

## 2017-05-13 ENCOUNTER — Telehealth: Payer: Self-pay | Admitting: Family Medicine

## 2017-05-13 ENCOUNTER — Ambulatory Visit (INDEPENDENT_AMBULATORY_CARE_PROVIDER_SITE_OTHER): Payer: Medicare Other | Admitting: Family Medicine

## 2017-05-13 ENCOUNTER — Encounter: Payer: Self-pay | Admitting: Family Medicine

## 2017-05-13 VITALS — BP 153/77 | HR 73 | Temp 96.9°F | Ht 63.0 in | Wt 151.0 lb

## 2017-05-13 DIAGNOSIS — E559 Vitamin D deficiency, unspecified: Secondary | ICD-10-CM

## 2017-05-13 DIAGNOSIS — M5126 Other intervertebral disc displacement, lumbar region: Secondary | ICD-10-CM | POA: Diagnosis not present

## 2017-05-13 DIAGNOSIS — M8589 Other specified disorders of bone density and structure, multiple sites: Secondary | ICD-10-CM

## 2017-05-13 DIAGNOSIS — M8580 Other specified disorders of bone density and structure, unspecified site: Secondary | ICD-10-CM

## 2017-05-13 DIAGNOSIS — I10 Essential (primary) hypertension: Secondary | ICD-10-CM | POA: Diagnosis not present

## 2017-05-13 DIAGNOSIS — E785 Hyperlipidemia, unspecified: Secondary | ICD-10-CM

## 2017-05-13 MED ORDER — RALOXIFENE HCL 60 MG PO TABS: 60.0000 mg | ORAL_TABLET | Freq: Every day | ORAL | 1 refills | Status: DC

## 2017-05-13 MED ORDER — ROPINIROLE HCL 1 MG PO TABS: 1.0000 mg | ORAL_TABLET | Freq: Three times a day (TID) | ORAL | 1 refills | Status: DC

## 2017-05-13 MED ORDER — TRIAMTERENE-HCTZ 37.5-25 MG PO TABS: 1.0000 | ORAL_TABLET | Freq: Every day | ORAL | 3 refills | Status: DC

## 2017-05-13 MED ORDER — ATORVASTATIN CALCIUM 40 MG PO TABS: 40.0000 mg | ORAL_TABLET | Freq: Every day | ORAL | 1 refills | Status: DC

## 2017-05-13 MED ORDER — AMLODIPINE BESYLATE 10 MG PO TABS: 10.0000 mg | ORAL_TABLET | Freq: Every day | ORAL | 1 refills | Status: DC

## 2017-05-13 NOTE — Progress Notes (Signed)
Subjective:  Patient ID: Anita Perez, female    DOB: 1935-07-10  Age: 82 y.o. MRN: [id number]  CC: Hypertension (pt here today for routine follow up of her chronic medical conditions )   HPI VENDETTA PITTINGER presents for  follow-up of hypertension. Patient has no history of headache chest pain or shortness of breath or recent cough. Patient also denies symptoms of TIA such as focal numbness or weakness. Patient checks  blood pressure at home. Readings recently have been good.  It only goes up when she comes here.  Patient denies side effects from medication. States taking it regularly.  On the other hand she is not taking her Lyrica regularly.  She is only taking 1 at night every now and then.  She still has 4/10 neuropathy pain in the left leg.  It is intermittent.  It does not bother her that bad.  She says that the full dose of Lyrica made her dizzy during the day but she is just taking it at nighttime and that the care of that problem.  Patient in for follow-up of elevated cholesterol. Doing well without complaints on current medication. Denies side effects of statin including myalgia and arthralgia and nausea. Also in today for liver function testing. Currently no chest pain, shortness of breath or other cardiovascular related symptoms noted.  Patient using vitamin D 1000 units daily over-the-counter.  Additionally she is taking calcium 600 mg once daily.  She has been diagnosed with osteopenia and is due for a recheck of her DEXA scan.  She continues to take Evista as noted below.  History Thessaly has a past medical history of Arthritis, Colon polyps, DDD (degenerative disc disease), lumbar, Diverticulitis (1990s), GERD (gastroesophageal reflux disease), Hiatal hernia, Hyperlipidemia, Hypertension, Osteopenia, Rectal carcinoma ([place]), and Small bowel obstruction ([place]) (04/25/2016).   She has a past surgical history that includes intestinal  tear; Appendectomy; Cosmetic surgery (Left);  Lumbar laminectomy/decompression microdiscectomy (Left, 06/14/2013); Knee arthroscopy (Right); Tumor removal (Left); Back surgery; Breast cyst excision (Left); Colostomy (1990s); Colostomy takedown (1990s); Colonoscopy w/ biopsies and polypectomy; and Colon surgery.   Her family history includes Cancer in her sister and sister; Early death in her brother; Hypertension in her father and mother; Kidney disease in her father and mother.She reports that she quit smoking about 34 years ago. Her smoking use included cigarettes. She has a 2.88 pack-year smoking history. She quit smokeless tobacco use about 71 years ago. Her smokeless tobacco use included snuff. She reports that she drinks alcohol. She reports that she does not use drugs.  Current Outpatient Medications on File Prior to Visit  Medication Sig Dispense Refill  . aspirin 81 MG tablet Take 81 mg by mouth daily.    . Calcium Carbonate-Vitamin D (CALCIUM + D PO) Take 600 mg by mouth daily.    . pregabalin (LYRICA) 50 MG capsule 1 qhs X7 days , then 2 qhs X 7d, then 3 qhs X 7d, then 4 qhs 120 capsule 5  . Vitamin D, Ergocalciferol, (DRISDOL) 50000 units CAPS capsule Take 1 capsule (50,000 Units total) by mouth 2 (two) times a week. 16 capsule 0   No current facility-administered medications on file prior to visit.     ROS Review of Systems  Constitutional: Negative for activity change, appetite change and fever.  HENT: Negative for congestion, rhinorrhea and sore throat.   Eyes: Negative for visual disturbance.  Respiratory: Negative for cough and shortness of breath.   Cardiovascular: Negative for chest pain  and palpitations.  Gastrointestinal: Negative for abdominal pain, diarrhea and nausea.  Genitourinary: Negative for dysuria.  Musculoskeletal: Negative for arthralgias and myalgias.    Objective:  BP (!) 153/77   Pulse 73   Temp (!) 96.9 F (36.1 C) (Oral)   Ht 5' 3" (1.6 m)   Wt 151 lb (68.5 kg)   BMI 26.75 kg/m   BP  Readings from Last 3 Encounters:  05/13/17 (!) 153/77  11/10/16 135/74  05/13/16 131/69    Wt Readings from Last 3 Encounters:  05/13/17 151 lb (68.5 kg)  11/10/16 150 lb (68 kg)  05/13/16 149 lb (67.6 kg)     Physical Exam  Constitutional: She is oriented to person, place, and time. She appears well-developed and well-nourished. No distress.  HENT:  Head: Normocephalic and atraumatic.  Right Ear: External ear normal.  Left Ear: External ear normal.  Nose: Nose normal.  Mouth/Throat: Oropharynx is clear and moist.  Eyes: Conjunctivae and EOM are normal. Pupils are equal, round, and reactive to light.  Neck: Normal range of motion. Neck supple. No thyromegaly present.  Cardiovascular: Normal rate, regular rhythm and normal heart sounds.  No murmur heard. Pulmonary/Chest: Effort normal and breath sounds normal. No respiratory distress. She has no wheezes. She has no rales.  Abdominal: Soft. Bowel sounds are normal. She exhibits no distension. There is no tenderness.  Lymphadenopathy:    She has no cervical adenopathy.  Neurological: She is alert and oriented to person, place, and time. She has normal reflexes.  Skin: Skin is warm and dry.  Psychiatric: She has a normal mood and affect. Her behavior is normal. Judgment and thought content normal.      Assessment & Plan:   Robecca was seen today for hypertension.  Diagnoses and all orders for this visit:  Essential hypertension, benign -     CMP14+EGFR  Hyperlipidemia with target LDL less than 100 -     Lipid panel -     atorvastatin (LIPITOR) 40 MG tablet; Take 1 tablet (40 mg total) by mouth daily.  Vitamin D deficiency -     VITAMIN D 25 Hydroxy (Vit-D Deficiency, Fractures)  Osteopenia of multiple sites  Lumbar disc herniation  Osteopenia determined by x-ray -     DG [facility] DEXA -     raloxifene (EVISTA) 60 MG tablet; Take 1 tablet (60 mg total) by mouth daily. For bone health and breast cancer  prevention  Other orders -     amLODipine (NORVASC) 10 MG tablet; Take 1 tablet (10 mg total) by mouth daily. -     rOPINIRole (REQUIP) 1 MG tablet; Take 1 tablet (1 mg total) by mouth 3 (three) times daily. -     triamterene-hydrochlorothiazide (MAXZIDE-25) 37.5-25 MG tablet; Take 1 tablet by mouth daily.   Allergies as of 05/13/2017      Reactions   Pepto-bismol [bismuth] Other (See Comments)   Turned mouth black inside      Medication List        Accurate as of 05/13/17  1:46 PM. Always use your most recent med list.          amLODipine 10 MG tablet Commonly known as:  NORVASC Take 1 tablet (10 mg total) by mouth daily.   aspirin 81 MG tablet Take 81 mg by mouth daily.   atorvastatin 40 MG tablet Commonly known as:  LIPITOR Take 1 tablet (40 mg total) by mouth daily.   CALCIUM + D PO Take 600 mg  by mouth daily.   pregabalin 50 MG capsule Commonly known as:  LYRICA 1 qhs X7 days , then 2 qhs X 7d, then 3 qhs X 7d, then 4 qhs   raloxifene 60 MG tablet Commonly known as:  EVISTA Take 1 tablet (60 mg total) by mouth daily. For bone health and breast cancer prevention   rOPINIRole 1 MG tablet Commonly known as:  REQUIP Take 1 tablet (1 mg total) by mouth 3 (three) times daily.   triamterene-hydrochlorothiazide 37.5-25 MG tablet Commonly known as:  MAXZIDE-25 Take 1 tablet by mouth daily.   Vitamin D (Ergocalciferol) 50000 units Caps capsule Commonly known as:  DRISDOL Take 1 capsule (50,000 Units total) by mouth 2 (two) times a week.       Meds ordered this encounter  Medications  . atorvastatin (LIPITOR) 40 MG tablet    Sig: Take 1 tablet (40 mg total) by mouth daily.    Dispense:  90 tablet    Refill:  1  . raloxifene (EVISTA) 60 MG tablet    Sig: Take 1 tablet (60 mg total) by mouth daily. For bone health and breast cancer prevention    Dispense:  90 tablet    Refill:  1  . amLODipine (NORVASC) 10 MG tablet    Sig: Take 1 tablet (10 mg total) by  mouth daily.    Dispense:  90 tablet    Refill:  1  . rOPINIRole (REQUIP) 1 MG tablet    Sig: Take 1 tablet (1 mg total) by mouth 3 (three) times daily.    Dispense:  90 tablet    Refill:  1  . triamterene-hydrochlorothiazide (MAXZIDE-25) 37.5-25 MG tablet    Sig: Take 1 tablet by mouth daily.    Dispense:  90 tablet    Refill:  3     Follow-up: Return in about 6 months (around 11/10/2017).  Claretta Fraise, M.D.

## 2017-05-13 NOTE — Patient Instructions (Signed)
Check your BP 3 time, one minute apart every morning and evening for 1 week.  Increased your calcium to twice daily.  Take the Lyrica 50 mg-take 2 by mouth at bedtime until you run out.

## 2017-05-13 NOTE — Telephone Encounter (Signed)
Please advise 

## 2017-05-14 LAB — LIPID PANEL
Chol/HDL Ratio: 3.2 ratio (ref 0.0–4.4)
Cholesterol, Total: 164 mg/dL (ref 100–199)
HDL: 51 mg/dL (ref >39)
LDL Calculated: 85 mg/dL (ref 0–99)
Triglycerides: 140 mg/dL (ref 0–149)
VLDL Cholesterol Cal: 28 mg/dL (ref 5–40)

## 2017-05-14 LAB — CMP14+EGFR
ALT: 12 IU/L (ref 0–32)
AST: 20 IU/L (ref 0–40)
Albumin/Globulin Ratio: 1.7 (ref 1.2–2.2)
Albumin: 4.4 g/dL (ref 3.5–4.8)
Alkaline Phosphatase: 86 IU/L (ref 39–117)
BUN / CREAT RATIO: 16 (ref 12–28)
BUN: 14 mg/dL (ref 8–27)
Bilirubin Total: 0.4 mg/dL (ref 0.0–1.2)
CALCIUM: 9.4 mg/dL (ref 8.7–10.3)
CO2: 25 mmol/L (ref 20–29)
Chloride: 106 mmol/L (ref 96–106)
Creatinine, Ser: 0.86 mg/dL (ref 0.57–1.00)
GFR, EST AFRICAN AMERICAN: 74 mL/min/{1.73_m2} (ref >59)
GFR, EST NON AFRICAN AMERICAN: 64 mL/min/{1.73_m2} (ref >59)
GLOBULIN, TOTAL: 2.6 g/dL (ref 1.5–4.5)
Glucose: 99 mg/dL (ref 65–99)
POTASSIUM: 4.1 mmol/L (ref 3.5–5.2)
SODIUM: 145 mmol/L — AB (ref 134–144)
TOTAL PROTEIN: 7 g/dL (ref 6.0–8.5)

## 2017-05-14 LAB — VITAMIN D 25 HYDROXY (VIT D DEFICIENCY, FRACTURES): Vit D, 25-Hydroxy: 28.7 ng/mL — ABNORMAL LOW (ref 30.0–100.0)

## 2017-05-15 NOTE — Telephone Encounter (Signed)
Pt aware of appointment date/time 

## 2017-05-15 NOTE — Telephone Encounter (Signed)
[facility] to x-ray 

## 2017-05-18 ENCOUNTER — Ambulatory Visit (INDEPENDENT_AMBULATORY_CARE_PROVIDER_SITE_OTHER): Payer: Medicare Other

## 2017-05-18 DIAGNOSIS — M8588 Other specified disorders of bone density and structure, other site: Secondary | ICD-10-CM

## 2017-06-17 ENCOUNTER — Ambulatory Visit (INDEPENDENT_AMBULATORY_CARE_PROVIDER_SITE_OTHER): Payer: Medicare Other | Admitting: *Deleted

## 2017-06-17 ENCOUNTER — Encounter: Payer: Self-pay | Admitting: *Deleted

## 2017-06-17 VITALS — BP 163/66 | HR 69 | Ht 62.0 in | Wt 152.0 lb

## 2017-06-17 DIAGNOSIS — Z0000 Encounter for general adult medical examination without abnormal findings: Secondary | ICD-10-CM | POA: Diagnosis not present
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
## 2017-06-17 NOTE — Patient Instructions (Signed)
  Ms. Bebee , Thank you for taking time to come for your Medicare Wellness Visit. I appreciate your ongoing commitment to your health goals. Please review the following plan we discussed and let me know if I can assist you in the future.   These are the goals we discussed: Goals    . Exercise 150 minutes per week (moderate activity)       This is a list of the screening recommended for you and due dates:  Health Maintenance  Topic Date Due  . Mammogram  01/24/2015  . Flu Shot  10/29/2017*  . Tetanus Vaccine  10/29/2017*  . Pneumonia vaccines (2 of 2 - PPSV23) 06/18/2018*  . DEXA scan (bone density measurement)  05/19/2019  *Topic was postponed. The date shown is not the original due date.

## 2017-08-12 ENCOUNTER — Other Ambulatory Visit: Payer: Self-pay | Admitting: Family Medicine

## 2017-08-12 MED ORDER — AMLODIPINE BESYLATE 10 MG PO TABS: 10.0000 mg | ORAL_TABLET | Freq: Every day | ORAL | 0 refills | Status: DC

## 2017-08-12 NOTE — Telephone Encounter (Signed)
Pt aware refill sent to [facility] 

## 2017-09-25 ENCOUNTER — Encounter: Payer: Self-pay | Admitting: Nurse Practitioner

## 2017-09-25 ENCOUNTER — Ambulatory Visit (INDEPENDENT_AMBULATORY_CARE_PROVIDER_SITE_OTHER): Payer: Medicare Other

## 2017-09-25 ENCOUNTER — Ambulatory Visit (INDEPENDENT_AMBULATORY_CARE_PROVIDER_SITE_OTHER): Payer: Medicare Other | Admitting: Nurse Practitioner

## 2017-09-25 VITALS — BP 197/106 | HR 77 | Temp 97.7°F | Ht 62.0 in | Wt 151.0 lb

## 2017-09-25 DIAGNOSIS — M25561 Pain in right knee: Secondary | ICD-10-CM | POA: Diagnosis not present

## 2017-09-25 DIAGNOSIS — M25461 Effusion, right knee: Secondary | ICD-10-CM | POA: Diagnosis not present

## 2017-09-25 MED ORDER — CELECOXIB 200 MG PO CAPS: 200.0000 mg | ORAL_CAPSULE | Freq: Two times a day (BID) | ORAL | 2 refills | Status: DC
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
## 2017-09-25 NOTE — Patient Instructions (Signed)

## 2017-09-30 ENCOUNTER — Other Ambulatory Visit: Payer: Self-pay | Admitting: *Deleted

## 2017-09-30 DIAGNOSIS — M25561 Pain in right knee: Secondary | ICD-10-CM

## 2017-10-08 ENCOUNTER — Ambulatory Visit (INDEPENDENT_AMBULATORY_CARE_PROVIDER_SITE_OTHER): Payer: Medicare Other | Admitting: Family Medicine

## 2017-10-08 ENCOUNTER — Ambulatory Visit (INDEPENDENT_AMBULATORY_CARE_PROVIDER_SITE_OTHER): Payer: Medicare Other

## 2017-10-08 ENCOUNTER — Encounter: Payer: Self-pay | Admitting: Family Medicine

## 2017-10-08 VITALS — BP 140/71 | HR 46 | Temp 98.4°F | Wt 150.2 lb

## 2017-10-08 DIAGNOSIS — M79641 Pain in right hand: Secondary | ICD-10-CM

## 2017-10-08 DIAGNOSIS — M545 Low back pain, unspecified: Secondary | ICD-10-CM

## 2017-10-08 DIAGNOSIS — M79642 Pain in left hand: Secondary | ICD-10-CM | POA: Diagnosis not present

## 2017-10-08 DIAGNOSIS — M549 Dorsalgia, unspecified: Secondary | ICD-10-CM | POA: Diagnosis not present

## 2017-10-08 DIAGNOSIS — S6992XA Unspecified injury of left wrist, hand and finger(s), initial encounter: Secondary | ICD-10-CM | POA: Diagnosis not present

## 2017-10-08 DIAGNOSIS — S3992XA Unspecified injury of lower back, initial encounter: Secondary | ICD-10-CM | POA: Diagnosis not present

## 2017-10-08 DIAGNOSIS — S6991XA Unspecified injury of right wrist, hand and finger(s), initial encounter: Secondary | ICD-10-CM | POA: Diagnosis not present

## 2017-10-08 MED ORDER — PREDNISONE 10 MG PO TABS: ORAL_TABLET | ORAL | 0 refills | Status: DC
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
## 2017-10-13 DIAGNOSIS — M1711 Unilateral primary osteoarthritis, right knee: Secondary | ICD-10-CM | POA: Diagnosis not present

## 2017-10-13 DIAGNOSIS — M25561 Pain in right knee: Secondary | ICD-10-CM | POA: Insufficient documentation

## 2017-10-19 ENCOUNTER — Ambulatory Visit: Payer: Medicare Other | Attending: Family Medicine | Admitting: Physical Therapy

## 2017-10-19 ENCOUNTER — Encounter: Payer: Self-pay | Admitting: Physical Therapy

## 2017-10-19 DIAGNOSIS — M545 Low back pain: Secondary | ICD-10-CM | POA: Diagnosis not present
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
## 2017-10-22 ENCOUNTER — Ambulatory Visit: Payer: Medicare Other | Admitting: *Deleted

## 2017-10-22 DIAGNOSIS — M545 Low back pain: Secondary | ICD-10-CM
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
## 2017-10-26 ENCOUNTER — Ambulatory Visit: Payer: Medicare Other | Admitting: Physical Therapy

## 2017-10-26 ENCOUNTER — Encounter: Payer: Self-pay | Admitting: Physical Therapy

## 2017-10-26 DIAGNOSIS — M545 Low back pain: Secondary | ICD-10-CM | POA: Diagnosis not present
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
## 2017-10-29 ENCOUNTER — Ambulatory Visit: Payer: Medicare Other | Attending: Family Medicine | Admitting: Physical Therapy

## 2017-10-29 ENCOUNTER — Encounter: Payer: Self-pay | Admitting: Physical Therapy

## 2017-10-29 DIAGNOSIS — M545 Low back pain: Secondary | ICD-10-CM | POA: Diagnosis not present
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
## 2017-11-02 ENCOUNTER — Ambulatory Visit: Payer: Medicare Other | Admitting: Physical Therapy

## 2017-11-02 ENCOUNTER — Encounter: Payer: Self-pay | Admitting: Physical Therapy

## 2017-11-02 DIAGNOSIS — M545 Low back pain: Secondary | ICD-10-CM | POA: Diagnosis not present
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
## 2017-11-05 ENCOUNTER — Ambulatory Visit: Payer: Medicare Other | Admitting: Physical Therapy

## 2017-11-05 ENCOUNTER — Encounter: Payer: Self-pay | Admitting: Physical Therapy

## 2017-11-05 DIAGNOSIS — M545 Low back pain: Secondary | ICD-10-CM
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
## 2017-11-10 ENCOUNTER — Ambulatory Visit (INDEPENDENT_AMBULATORY_CARE_PROVIDER_SITE_OTHER): Payer: Medicare Other | Admitting: Family Medicine

## 2017-11-10 ENCOUNTER — Ambulatory Visit: Payer: Medicare Other | Admitting: *Deleted

## 2017-11-10 ENCOUNTER — Encounter: Payer: Self-pay | Admitting: Family Medicine

## 2017-11-10 VITALS — BP 135/81 | HR 81 | Temp 97.2°F | Ht 62.0 in | Wt 149.2 lb

## 2017-11-10 DIAGNOSIS — M545 Low back pain: Secondary | ICD-10-CM | POA: Diagnosis not present

## 2017-11-10 DIAGNOSIS — E785 Hyperlipidemia, unspecified: Secondary | ICD-10-CM

## 2017-11-10 DIAGNOSIS — I10 Essential (primary) hypertension: Secondary | ICD-10-CM

## 2017-11-10 DIAGNOSIS — M8580 Other specified disorders of bone density and structure, unspecified site: Secondary | ICD-10-CM | POA: Diagnosis not present

## 2017-11-10 DIAGNOSIS — E559 Vitamin D deficiency, unspecified: Secondary | ICD-10-CM | POA: Diagnosis not present

## 2017-11-10 DIAGNOSIS — G629 Polyneuropathy, unspecified: Secondary | ICD-10-CM | POA: Diagnosis not present

## 2017-11-10 DIAGNOSIS — M25561 Pain in right knee: Secondary | ICD-10-CM | POA: Diagnosis not present

## 2017-11-10 DIAGNOSIS — G2581 Restless legs syndrome: Secondary | ICD-10-CM | POA: Diagnosis not present

## 2017-11-10 DIAGNOSIS — Z23 Encounter for immunization: Secondary | ICD-10-CM | POA: Diagnosis not present

## 2017-11-10 DIAGNOSIS — M5126 Other intervertebral disc displacement, lumbar region: Secondary | ICD-10-CM

## 2017-11-10 MED ORDER — RALOXIFENE HCL 60 MG PO TABS: 60.0000 mg | ORAL_TABLET | Freq: Every day | ORAL | 1 refills | Status: DC

## 2017-11-10 MED ORDER — ROPINIROLE HCL 2 MG PO TABS: 2.0000 mg | ORAL_TABLET | Freq: Two times a day (BID) | ORAL | 1 refills | Status: DC

## 2017-11-10 MED ORDER — AMLODIPINE BESYLATE 10 MG PO TABS: 10.0000 mg | ORAL_TABLET | Freq: Every day | ORAL | 1 refills | Status: DC

## 2017-11-10 MED ORDER — CELECOXIB 200 MG PO CAPS: 200.0000 mg | ORAL_CAPSULE | Freq: Two times a day (BID) | ORAL | 1 refills | Status: DC

## 2017-11-10 MED ORDER — ATORVASTATIN CALCIUM 40 MG PO TABS: 40.0000 mg | ORAL_TABLET | Freq: Every day | ORAL | 1 refills | Status: DC

## 2017-11-10 MED ORDER — PREGABALIN 150 MG PO CAPS: 150.0000 mg | ORAL_CAPSULE | Freq: Every day | ORAL | 5 refills | Status: DC
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
## 2017-11-10 NOTE — Progress Notes (Signed)
Subjective:  Patient ID: Anita Perez,  female    DOB: 05-Mar-1936  Age: 82 y.o.    CC: Medical Management of Chronic Issues (pt here today for routine follow up and also c/o legs cramping at night keeping her up for the past week)   HPI Anita Perez presents for  follow-up of hypertension. Patient has no history of headache chest pain or shortness of breath or recent cough. Patient also denies symptoms of TIA such as numbness weakness lateralizing. Patient denies side effects from medication. States taking it regularly.  Patient also  in for follow-up of elevated cholesterol. Doing well without complaints on current medication. Denies side effects  including myalgia and arthralgia and nausea. Also in today for liver function testing. Currently no chest pain, shortness of breath or other cardiovascular related symptoms noted.  Follow-up of peripheral neuropathy . Still having dizzines with lyrica, but only taking one each evening perhaps three nights at a time, followed by two nights or so without taking it. Legs still cramp too, but the main concern today is that they are burning especially at night.   Wants to have a shot in her back. Doesn't want to go to a specialist though so she prefers to try medication.   [name] has a past medical history of Arthritis, Colon polyps, DDD (degenerative disc disease), lumbar, Diverticulitis (1990s), GERD (gastroesophageal reflux disease), Hiatal hernia, Hyperlipidemia, Hypertension, Osteopenia, Rectal carcinoma ([place]), and Small bowel obstruction ([place]) (04/25/2016).   She has a past surgical history that includes intestinal  tear; Appendectomy; Cosmetic surgery (Left); Lumbar laminectomy/decompression microdiscectomy (Left, 06/14/2013); Knee arthroscopy (Right); Tumor removal (Left); Back surgery; Breast cyst excision (Left); Colostomy (1990s); Colostomy takedown (1990s); Colonoscopy w/ biopsies and polypectomy; and Colon surgery.   Her  family history includes Cancer in her daughter, sister, and sister; Early death in her brother; Hypertension in her father and mother; Kidney disease in her father and mother.She reports that she quit smoking about 35 years ago. Her smoking use included cigarettes. She has a 2.88 pack-year smoking history. She quit smokeless tobacco use about 71 years ago.  Her smokeless tobacco use included snuff. She reports that she drinks alcohol. She reports that she does not use drugs.  Current Outpatient Medications on File Prior to Visit  Medication Sig Dispense Refill  . amLODipine (NORVASC) 10 MG tablet Take 1 tablet (10 mg total) by mouth daily. 90 tablet 0  . aspirin 81 MG tablet Take 81 mg by mouth daily.    Marland Kitchen atorvastatin (LIPITOR) 40 MG tablet Take 1 tablet (40 mg total) by mouth daily. 90 tablet 1  . Calcium Carbonate-Vitamin D (CALCIUM + D PO) Take 600 mg by mouth daily.    . celecoxib (CELEBREX) 200 MG capsule Take 1 capsule (200 mg total) by mouth 2 (two) times daily. 30 capsule 2  . Cholecalciferol (VITAMIN D) 2000 units CAPS Take 2,000 Units by mouth daily.    . pregabalin (LYRICA) 50 MG capsule 1 qhs X7 days , then 2 qhs X 7d, then 3 qhs X 7d, then 4 qhs 120 capsule 5  . raloxifene (EVISTA) 60 MG tablet Take 1 tablet (60 mg total) by mouth daily. For bone health and breast cancer prevention 90 tablet 1  . rOPINIRole (REQUIP) 1 MG tablet Take 1 tablet (1 mg total) by mouth 3 (three) times daily. 90 tablet 1  . triamterene-hydrochlorothiazide (MAXZIDE-25) 37.5-25 MG tablet Take 1 tablet by mouth daily. 90 tablet 3  No current facility-administered medications on file prior to visit.     ROS Review of Systems  Constitutional: Negative.   HENT: Negative for congestion.   Eyes: Negative for visual disturbance.  Respiratory: Negative for shortness of breath.   Cardiovascular: Negative for chest pain.  Gastrointestinal: Negative for abdominal pain, constipation, diarrhea, nausea and vomiting.   Genitourinary: Negative for difficulty urinating.  Musculoskeletal: Positive for back pain and myalgias. Negative for arthralgias.  Neurological: Positive for numbness (tips of toes. They curl under sometimes  and she has to straighten them manually). Negative for headaches.  Psychiatric/Behavioral: Negative for sleep disturbance.    Objective:  BP 135/81   Pulse 81   Temp (!) 97.2 F (36.2 C) (Oral)   Ht 5\' 2"  (1.575 m)   Wt 149 lb 4 oz (67.7 kg)   BMI 27.30 kg/m   BP Readings from Last 3 Encounters:  11/10/17 135/81  10/08/17 140/71  09/25/17 (!) 197/106    Wt Readings from Last 3 Encounters:  11/10/17 149 lb 4 oz (67.7 kg)  10/08/17 150 lb 4 oz (68.2 kg)  09/25/17 151 lb (68.5 kg)     Physical Exam  Constitutional: She is oriented to person, place, and time. She appears well-developed and well-nourished. No distress.  HENT:  Head: Normocephalic and atraumatic.  Right Ear: External ear normal.  Left Ear: External ear normal.  Eyes: Pupils are equal, round, and reactive to light. Conjunctivae and EOM are normal.  Neck: Normal range of motion. Neck supple. No thyromegaly present.  Cardiovascular: Normal rate, regular rhythm and normal heart sounds.  No murmur heard. Pulmonary/Chest: Effort normal and breath sounds normal. No respiratory distress. She has no wheezes. She has no rales.  Musculoskeletal: She exhibits no edema, tenderness or deformity.  Lymphadenopathy:    She has no cervical adenopathy.  Neurological: She is alert and oriented to person, place, and time. She has normal reflexes.  Skin: Skin is warm and dry.  Psychiatric: She has a normal mood and affect. Her behavior is normal. Judgment and thought content normal.      Assessment & Plan:   Anita Perez was seen today for medical management of chronic issues.  Diagnoses and all orders for this visit:  Essential hypertension, benign  Hyperlipidemia with target LDL less than 100  Neuropathy  (HCC)  Restless leg syndrome  Lumbar disc herniation  Acute pain of right knee   I have discontinued [employer]. Frenette's predniSONE. I am also having her maintain her aspirin, Calcium Carbonate-Vitamin D (CALCIUM + D PO), pregabalin, atorvastatin, raloxifene, rOPINIRole, triamterene-hydrochlorothiazide, Vitamin D, amLODipine, and celecoxib.  No orders of the defined types were placed in this encounter.    Follow-up: Return in about 6 months (around 05/13/2018), or if symptoms worsen or fail to improve.  Claretta Fraise, M.D.

## 2017-11-11 LAB — CMP14+EGFR
ALBUMIN: 4 g/dL (ref 3.5–4.7)
ALT: 17 IU/L (ref 0–32)
AST: 25 IU/L (ref 0–40)
Albumin/Globulin Ratio: 1.5 (ref 1.2–2.2)
Alkaline Phosphatase: 82 IU/L (ref 39–117)
BUN / CREAT RATIO: 23 (ref 12–28)
BUN: 24 mg/dL (ref 8–27)
Bilirubin Total: 0.4 mg/dL (ref 0.0–1.2)
CALCIUM: 9.5 mg/dL (ref 8.7–10.3)
CO2: 27 mmol/L (ref 20–29)
CREATININE: 1.05 mg/dL — AB (ref 0.57–1.00)
Chloride: 102 mmol/L (ref 96–106)
GFR calc Af Amer: 58 mL/min/{1.73_m2} — ABNORMAL LOW (ref >59)
GFR, EST NON AFRICAN AMERICAN: 50 mL/min/{1.73_m2} — AB (ref >59)
Globulin, Total: 2.7 g/dL (ref 1.5–4.5)
Glucose: 90 mg/dL (ref 65–99)
Potassium: 4.5 mmol/L (ref 3.5–5.2)
SODIUM: 142 mmol/L (ref 134–144)
Total Protein: 6.7 g/dL (ref 6.0–8.5)

## 2017-11-11 LAB — LIPID PANEL
CHOL/HDL RATIO: 4.5 ratio — AB (ref 0.0–4.4)
Cholesterol, Total: 254 mg/dL — ABNORMAL HIGH (ref 100–199)
HDL: 56 mg/dL (ref >39)
LDL CALC: 169 mg/dL — AB (ref 0–99)
TRIGLYCERIDES: 146 mg/dL (ref 0–149)
VLDL CHOLESTEROL CAL: 29 mg/dL (ref 5–40)

## 2017-11-11 LAB — VITAMIN D 25 HYDROXY (VIT D DEFICIENCY, FRACTURES): Vit D, 25-Hydroxy: 30.4 ng/mL (ref 30.0–100.0)

## 2017-11-12 ENCOUNTER — Ambulatory Visit: Payer: Medicare Other | Admitting: *Deleted

## 2017-11-12 DIAGNOSIS — M545 Low back pain: Secondary | ICD-10-CM | POA: Diagnosis not present
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
## 2017-11-17 ENCOUNTER — Encounter: Payer: Self-pay | Admitting: Physical Therapy

## 2017-11-17 ENCOUNTER — Ambulatory Visit: Payer: Medicare Other | Admitting: Physical Therapy

## 2017-11-17 DIAGNOSIS — M545 Low back pain: Secondary | ICD-10-CM
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
## 2017-11-19 ENCOUNTER — Encounter: Payer: 59 | Admitting: *Deleted

## 2017-11-20 ENCOUNTER — Encounter: Payer: Self-pay | Admitting: Physical Therapy

## 2017-11-20 ENCOUNTER — Ambulatory Visit: Payer: Medicare Other | Admitting: Physical Therapy

## 2017-11-20 DIAGNOSIS — M545 Low back pain: Secondary | ICD-10-CM
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
## 2017-11-24 ENCOUNTER — Ambulatory Visit: Payer: Medicare Other | Admitting: *Deleted

## 2017-11-24 DIAGNOSIS — M545 Low back pain: Secondary | ICD-10-CM
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
## 2017-11-26 ENCOUNTER — Ambulatory Visit: Payer: Medicare Other | Admitting: *Deleted

## 2017-11-26 DIAGNOSIS — M545 Low back pain: Secondary | ICD-10-CM | POA: Diagnosis not present
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
## 2017-12-08 ENCOUNTER — Encounter: Payer: Self-pay | Admitting: Family Medicine

## 2017-12-08 ENCOUNTER — Ambulatory Visit (INDEPENDENT_AMBULATORY_CARE_PROVIDER_SITE_OTHER): Payer: Medicare Other | Admitting: Family Medicine

## 2017-12-08 VITALS — BP 137/80 | HR 72 | Temp 97.8°F | Ht 62.0 in | Wt 151.0 lb

## 2017-12-08 DIAGNOSIS — M1711 Unilateral primary osteoarthritis, right knee: Secondary | ICD-10-CM

## 2017-12-08 DIAGNOSIS — G8929 Other chronic pain: Secondary | ICD-10-CM | POA: Diagnosis not present

## 2017-12-08 DIAGNOSIS — M25561 Pain in right knee: Secondary | ICD-10-CM

## 2017-12-08 MED ORDER — DICLOFENAC SODIUM 1 % TD GEL: 2.0000 g | Freq: Four times a day (QID) | TRANSDERMAL | 3 refills | Status: DC

## 2017-12-08 NOTE — Progress Notes (Signed)
Subjective: CC: Chronic right knee pain PCP: Claretta Fraise, MD Anita Perez is a 82 y.o. female presenting to clinic today for:  1.  Chronic right knee pain Patient reports acute on chronic right knee pain over the last week.  She states that it tends to be on the lateral aspect of the knee and radiates some to the thigh.  Denies any gross swelling or discoloration.  She has a history of right knee arthroscopy several years ago.  She is currently treated with Celebrex 200 mg and states that this tends to work but has not been working that well lately.  Her daughter who suffers from knee pain as well had a tubal Voltaren gel, which the patient has been using and finds to be very helpful.  She is wondering if this can be prescribed for her.  Denies any falls, gross sensation changes.  No recent injury.   ROS: Per HPI  Allergies  Allergen Reactions  . Pepto-Bismol [Bismuth] Other (See Comments)    Turned mouth black inside   Past Medical History:  Diagnosis Date  . Arthritis    "might have it in my back" (04/25/2016)  . Colon polyps   . DDD (degenerative disc disease), lumbar   . Diverticulitis 1990s   "don't have it anymore" (04/25/2016)  . GERD (gastroesophageal reflux disease)   . Hiatal hernia   . Hyperlipidemia   . Hypertension   . Osteopenia   . Rectal carcinoma ([place])    tumor; "noncancerous" (04/25/2016)  . Small bowel obstruction ([place]) 04/25/2016    Current Outpatient Medications:  .  amLODipine (NORVASC) 10 MG tablet, Take 1 tablet (10 mg total) by mouth daily., Disp: 90 tablet, Rfl: 1 .  aspirin 81 MG tablet, Take 81 mg by mouth daily., Disp: , Rfl:  .  atorvastatin (LIPITOR) 40 MG tablet, Take 1 tablet (40 mg total) by mouth daily., Disp: 90 tablet, Rfl: 1 .  Calcium Carbonate-Vitamin D (CALCIUM + D PO), Take 600 mg by mouth daily., Disp: , Rfl:  .  celecoxib (CELEBREX) 200 MG capsule, Take 1 capsule (200 mg total) by mouth 2 (two) times daily., Disp: 180  capsule, Rfl: 1 .  Cholecalciferol (VITAMIN D) 2000 units CAPS, Take 2,000 Units by mouth daily., Disp: , Rfl:  .  pregabalin (LYRICA) 150 MG capsule, Take 1 capsule (150 mg total) by mouth at bedtime., Disp: 30 capsule, Rfl: 5 .  raloxifene (EVISTA) 60 MG tablet, Take 1 tablet (60 mg total) by mouth daily. For bone health and breast cancer prevention, Disp: 90 tablet, Rfl: 1 .  rOPINIRole (REQUIP) 2 MG tablet, Take 1 tablet (2 mg total) by mouth 2 (two) times daily., Disp: 180 tablet, Rfl: 1 .  triamterene-hydrochlorothiazide (MAXZIDE-25) 37.5-25 MG tablet, Take 1 tablet by mouth daily., Disp: 90 tablet, Rfl: 3 Social History   Socioeconomic History  . Marital status: Widowed    Spouse name: Not on file  . Number of children: 5  . Years of education: 79  . Highest education level: 12th grade  Occupational History    Employer: [employer]  Social Needs  . Financial resource strain: Not hard at all  . Food insecurity:    Worry: Never true    Inability: Never true  . Transportation needs:    Medical: No    Non-medical: No  Tobacco Use  . Smoking status: Former Smoker    Packs/day: 0.12    Years: 24.00    Pack years: 2.88  Types: Cigarettes    Last attempt to quit: 06/25/1982    Years since quitting: 35.4  . Smokeless tobacco: Former Systems developer    Types: Snuff    Quit date: 1948  Substance and Sexual Activity  . Alcohol use: Yes    Comment: 04/25/2016 "glass of wine a few times/year"  . Drug use: No  . Sexual activity: Never  Lifestyle  . Physical activity:    Days per week: 3 days    Minutes per session: 30 min  . Stress: Not at all  Relationships  . Social connections:    Talks on phone: More than three times a week    Gets together: More than three times a week    Attends religious service: More than 4 times per year    Active member of club or organization: Yes    Attends meetings of clubs or organizations: More than 4 times per year    Relationship status: Widowed  .  Intimate partner violence:    Fear of current or ex partner: Not on file    Emotionally abused: Not on file    Physically abused: Not on file    Forced sexual activity: Not on file  Other Topics Concern  . Not on file  Social History Narrative  . Not on file   Family History  Problem Relation Age of Onset  . Kidney disease Mother   . Hypertension Mother   . Kidney disease Father   . Hypertension Father   . Cancer Sister   . Cancer Sister   . Early death Brother        AT 59 MONTHS OLD  . Cancer Daughter        breast    Objective: Office vital signs reviewed. BP 137/80   Pulse 72   Temp 97.8 F (36.6 C) (Oral)   Ht 5\' 2"  (1.575 m)   Wt 151 lb (68.5 kg)   BMI 27.62 kg/m   Physical Examination:  General: Awake, alert, well nourished, No acute distress Extremities: warm, well perfused, No edema, cyanosis or clubbing; +2 pulses bilaterally MSK: normal gait and normal station  Right knee: No gross swelling or discoloration.  Patient has full active range of motion.  She has no tenderness to palpation to the patella, patellar tendon, quad tendon, posterior popliteal fossa.  She has no palpable posterior popliteal masses.  No gross joint effusions.  No ligamentous laxity.  Mild tenderness to palpation to the lateral knee. Neuro: light touch grossly in tact.  Assessment/ Plan: 82 y.o. female   1. Chronic pain of right knee Responding well to topical Voltaren gel.  I have prescribed this for her.  Could consider trial off of Celebrex if she is responsive to topical so as to avoid polypharmacy.  Follow up w/ PCP prn.  2. Primary osteoarthritis of right knee As above.   Meds ordered this encounter  Medications  . diclofenac sodium (VOLTAREN) 1 % GEL    Sig: Apply 2 g topically 4 (four) times daily. To right knee for pain    Dispense:  400 g    Refill:  [place], [place] [phone]

## 2018-01-01 ENCOUNTER — Telehealth: Payer: Self-pay | Admitting: Family Medicine

## 2018-01-01 NOTE — Telephone Encounter (Signed)
Pt states that diclofenac caused SOB -- added to allergy list and d/c from med list.

## 2018-01-26 ENCOUNTER — Ambulatory Visit (INDEPENDENT_AMBULATORY_CARE_PROVIDER_SITE_OTHER): Payer: Medicare Other | Admitting: Family Medicine

## 2018-01-26 ENCOUNTER — Encounter: Payer: Self-pay | Admitting: Family Medicine

## 2018-01-26 VITALS — BP 159/84 | HR 78 | Temp 97.6°F | Ht 62.0 in | Wt 152.0 lb

## 2018-01-26 DIAGNOSIS — G609 Hereditary and idiopathic neuropathy, unspecified: Secondary | ICD-10-CM | POA: Diagnosis not present

## 2018-01-26 MED ORDER — PREGABALIN 50 MG PO CAPS: ORAL_CAPSULE | ORAL | 0 refills | Status: DC

## 2018-01-26 MED ORDER — PREDNISONE 10 MG PO TABS: ORAL_TABLET | ORAL | 0 refills | Status: DC

## 2018-01-26 NOTE — Addendum Note (Signed)
Addended by: Claretta Fraise on: 01/26/2018 02:53 PM   Modules accepted: Orders
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
## 2018-03-12 ENCOUNTER — Encounter (INDEPENDENT_AMBULATORY_CARE_PROVIDER_SITE_OTHER): Payer: Self-pay

## 2018-03-12 ENCOUNTER — Ambulatory Visit (INDEPENDENT_AMBULATORY_CARE_PROVIDER_SITE_OTHER): Payer: Medicare Other | Admitting: Nurse Practitioner

## 2018-03-12 ENCOUNTER — Encounter: Payer: Self-pay | Admitting: Nurse Practitioner

## 2018-03-12 VITALS — BP 157/80 | HR 76 | Temp 97.7°F | Ht 62.0 in | Wt 154.0 lb

## 2018-03-12 DIAGNOSIS — M25561 Pain in right knee: Secondary | ICD-10-CM

## 2018-03-12 MED ORDER — METHYLPREDNISOLONE ACETATE 40 MG/ML IJ SUSP
40.0000 mg | Freq: Once | INTRAMUSCULAR | Status: AC
  Administered 2018-03-12: 40 mg via INTRAMUSCULAR

## 2018-03-12 MED ORDER — BUPIVACAINE HCL 0.25 % IJ SOLN
1.0000 mL | Freq: Once | INTRAMUSCULAR | Status: AC
  Administered 2018-03-12: 1 mL via INTRA_ARTICULAR

## 2018-03-12 NOTE — Progress Notes (Signed)
   Subjective:    Patient ID: Anita Perez, female    DOB: 07/17/1935, 81 y.o.   MRN: [id number]   Chief Complaint: Knee Pain (Right  Wants injection)   HPI Patient comes in c/o right knee pain. It is making her ambulate funny and causing her hip to hurt. The knee pain started about 3 weeks ago and has gotten worse. She has been use a knee wrap that has helped but she would like it injected.   Review of Systems  Respiratory: Negative.   Cardiovascular: Negative.   Gastrointestinal: Negative.   Musculoskeletal: Positive for arthralgias (right knee).  Neurological: Negative.   Psychiatric/Behavioral: Negative.   All other systems reviewed and are negative.      Objective:   Physical Exam Constitutional:      Appearance: Normal appearance. She is normal weight.  Cardiovascular:     Rate and Rhythm: Normal rate and regular rhythm.  Pulmonary:     Effort: Pulmonary effort is normal.     Breath sounds: Normal breath sounds.  Musculoskeletal:     Comments: Right knee pain on flexion and extension- with crepitus Slight knee effusion.  Skin:    General: Skin is warm and dry.  Neurological:     General: No focal deficit present.     Mental Status: She is alert and oriented to person, place, and time.  Psychiatric:        Mood and Affect: Mood normal.        Behavior: Behavior normal.    BP (!) 157/80   Pulse 76   Temp 97.7 F (36.5 C) (Oral)   Ht 5\' 2"  (1.575 m)   Wt 154 lb (69.9 kg)   BMI 28.17 kg/m   Procedure  Cleaned with betadine  Marcaine 0.25 34ml with 40mg  depomedrol into right knee with 21g 1 1/2 inch needle.  Patient tolerated well.      Assessment & Plan:  Anita Perez in today with chief complaint of Knee Pain (Right  Wants injection)   1. Acute pain of right knee Right knee injection Ice Rest Wrap when up waling elevate when sitting RTO prn  - methylPREDNISolone acetate (DEPO-MEDROL) injection 40 mg - bupivacaine (MARCAINE) 0.25 % (with  pres) injection 1 mL  Mary-Margaret Hassell Done, FNP

## 2018-03-16 DIAGNOSIS — M5440 Lumbago with sciatica, unspecified side: Secondary | ICD-10-CM | POA: Diagnosis not present

## 2018-03-16 DIAGNOSIS — G629 Polyneuropathy, unspecified: Secondary | ICD-10-CM | POA: Diagnosis not present

## 2018-03-17 ENCOUNTER — Other Ambulatory Visit: Payer: Self-pay | Admitting: Neurosurgery

## 2018-03-17 DIAGNOSIS — M5440 Lumbago with sciatica, unspecified side: Secondary | ICD-10-CM

## 2018-03-30 ENCOUNTER — Ambulatory Visit
Admission: RE | Admit: 2018-03-30 | Discharge: 2018-03-30 | Disposition: A | Discharge status: Home / Self Care | Payer: Medicare Other | Source: Ambulatory Visit | Attending: Neurosurgery | Admitting: Neurosurgery

## 2018-03-30 DIAGNOSIS — M48061 Spinal stenosis, lumbar region without neurogenic claudication: Secondary | ICD-10-CM | POA: Diagnosis not present

## 2018-03-30 DIAGNOSIS — G629 Polyneuropathy, unspecified: Secondary | ICD-10-CM | POA: Insufficient documentation

## 2018-03-30 DIAGNOSIS — M47816 Spondylosis without myelopathy or radiculopathy, lumbar region: Secondary | ICD-10-CM | POA: Diagnosis not present

## 2018-03-30 DIAGNOSIS — M5127 Other intervertebral disc displacement, lumbosacral region: Secondary | ICD-10-CM | POA: Diagnosis not present

## 2018-03-30 DIAGNOSIS — M5126 Other intervertebral disc displacement, lumbar region: Secondary | ICD-10-CM | POA: Diagnosis not present

## 2018-03-30 DIAGNOSIS — M5440 Lumbago with sciatica, unspecified side: Secondary | ICD-10-CM

## 2018-04-06 DIAGNOSIS — M48061 Spinal stenosis, lumbar region without neurogenic claudication: Secondary | ICD-10-CM | POA: Diagnosis not present

## 2018-04-06 DIAGNOSIS — I10 Essential (primary) hypertension: Secondary | ICD-10-CM | POA: Diagnosis not present

## 2018-04-06 DIAGNOSIS — Z6827 Body mass index (BMI) 27.0-27.9, adult: Secondary | ICD-10-CM | POA: Diagnosis not present

## 2018-04-13 ENCOUNTER — Ambulatory Visit: Payer: Medicare Other | Attending: Student | Admitting: Physical Therapy

## 2018-04-13 DIAGNOSIS — M5442 Lumbago with sciatica, left side: Secondary | ICD-10-CM | POA: Diagnosis not present

## 2018-04-13 DIAGNOSIS — R293 Abnormal posture: Secondary | ICD-10-CM | POA: Insufficient documentation
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
## 2018-04-15 ENCOUNTER — Ambulatory Visit: Payer: Medicare Other | Admitting: Physical Therapy

## 2018-04-15 DIAGNOSIS — R293 Abnormal posture: Secondary | ICD-10-CM

## 2018-04-15 DIAGNOSIS — M5442 Lumbago with sciatica, left side: Secondary | ICD-10-CM
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
## 2018-04-20 ENCOUNTER — Ambulatory Visit: Payer: Medicare Other | Admitting: *Deleted

## 2018-04-20 DIAGNOSIS — R293 Abnormal posture: Secondary | ICD-10-CM

## 2018-04-20 DIAGNOSIS — M5442 Lumbago with sciatica, left side: Secondary | ICD-10-CM
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
## 2018-04-22 ENCOUNTER — Encounter: Payer: 59 | Admitting: *Deleted

## 2018-04-27 ENCOUNTER — Ambulatory Visit: Payer: Medicare Other | Admitting: *Deleted

## 2018-04-27 DIAGNOSIS — R293 Abnormal posture: Secondary | ICD-10-CM | POA: Diagnosis not present

## 2018-04-27 DIAGNOSIS — M5442 Lumbago with sciatica, left side: Secondary | ICD-10-CM
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
## 2018-04-29 ENCOUNTER — Ambulatory Visit: Payer: Medicare Other | Admitting: *Deleted

## 2018-04-29 DIAGNOSIS — M5442 Lumbago with sciatica, left side: Secondary | ICD-10-CM | POA: Diagnosis not present

## 2018-04-29 DIAGNOSIS — R293 Abnormal posture: Secondary | ICD-10-CM | POA: Diagnosis not present
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
## 2018-05-04 ENCOUNTER — Ambulatory Visit: Payer: Medicare Other | Attending: Student | Admitting: *Deleted

## 2018-05-04 DIAGNOSIS — M5442 Lumbago with sciatica, left side: Secondary | ICD-10-CM | POA: Insufficient documentation

## 2018-05-04 DIAGNOSIS — R293 Abnormal posture: Secondary | ICD-10-CM | POA: Diagnosis not present
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
## 2018-05-06 ENCOUNTER — Encounter: Payer: 59 | Admitting: *Deleted

## 2018-05-11 ENCOUNTER — Ambulatory Visit: Payer: Medicare Other | Admitting: *Deleted

## 2018-05-11 DIAGNOSIS — M5442 Lumbago with sciatica, left side: Secondary | ICD-10-CM | POA: Diagnosis not present

## 2018-05-11 DIAGNOSIS — R293 Abnormal posture: Secondary | ICD-10-CM | POA: Diagnosis not present
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
## 2018-05-13 ENCOUNTER — Ambulatory Visit: Payer: Medicare Other | Admitting: *Deleted

## 2018-05-13 DIAGNOSIS — M5442 Lumbago with sciatica, left side: Secondary | ICD-10-CM

## 2018-05-13 DIAGNOSIS — R293 Abnormal posture: Secondary | ICD-10-CM | POA: Diagnosis not present
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
## 2018-05-14 ENCOUNTER — Ambulatory Visit: Payer: Medicare Other | Admitting: Family Medicine

## 2018-05-18 DIAGNOSIS — M48061 Spinal stenosis, lumbar region without neurogenic claudication: Secondary | ICD-10-CM | POA: Diagnosis not present

## 2018-05-19 ENCOUNTER — Ambulatory Visit: Payer: Medicare Other | Admitting: Physical Therapy

## 2018-05-19 ENCOUNTER — Encounter: Payer: Self-pay | Admitting: Physical Therapy

## 2018-05-19 DIAGNOSIS — R293 Abnormal posture: Secondary | ICD-10-CM | POA: Diagnosis not present

## 2018-05-19 DIAGNOSIS — M5442 Lumbago with sciatica, left side: Secondary | ICD-10-CM

## 2018-05-19 NOTE — Patient Instructions (Signed)
Pelvic Tilt: Posterior - Legs Bent (Supine)   Tighten stomach and flatten back by rolling pelvis down. Hold _10___ seconds. Relax. Repeat _10-30___ times per set. Do __2__ sets per session. Do _2___ sessions per day.   . Straight Leg Raise   Tighten stomach and slowly raise locked right leg __4__ inches from floor. Repeat __10-30__ times per set. Do __2__ sets per session. Do __2__ sessions per day.    Self-Mobilization: Heel Slide (Supine)   Hold draw in and Slide left/Right heel toward buttocks until a gentle stretch is felt. Hold _5___ seconds. Relax. Repeat _10___ times per set. Do _2-3___ sets per session. Do _2___ sessions per day.  Bent Leg Lift (Hook-Lying)   Tighten stomach and slowly raise right leg _5___ inches from floor. Keep trunk rigid. Hold _3___ seconds. Repeat _10___ times per set. Do ___2-3_ sets per session. Do __2__ sessions per day.    
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
## 2018-05-20 ENCOUNTER — Encounter: Payer: 59 | Admitting: *Deleted

## 2018-05-25 ENCOUNTER — Ambulatory Visit: Payer: Medicare Other | Admitting: *Deleted

## 2018-05-25 DIAGNOSIS — R293 Abnormal posture: Secondary | ICD-10-CM | POA: Diagnosis not present

## 2018-05-25 DIAGNOSIS — M5442 Lumbago with sciatica, left side: Secondary | ICD-10-CM
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
## 2018-06-01 ENCOUNTER — Ambulatory Visit: Payer: Medicare Other | Attending: Student | Admitting: *Deleted

## 2018-06-01 DIAGNOSIS — R293 Abnormal posture: Secondary | ICD-10-CM

## 2018-06-01 DIAGNOSIS — M5442 Lumbago with sciatica, left side: Secondary | ICD-10-CM | POA: Diagnosis not present
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
## 2018-06-02 ENCOUNTER — Ambulatory Visit (INDEPENDENT_AMBULATORY_CARE_PROVIDER_SITE_OTHER): Payer: Medicare Other

## 2018-06-02 ENCOUNTER — Other Ambulatory Visit: Payer: Self-pay | Admitting: Family Medicine

## 2018-06-02 ENCOUNTER — Ambulatory Visit (INDEPENDENT_AMBULATORY_CARE_PROVIDER_SITE_OTHER): Payer: Medicare Other | Admitting: Family Medicine

## 2018-06-02 ENCOUNTER — Encounter: Payer: Self-pay | Admitting: Family Medicine

## 2018-06-02 VITALS — BP 135/72 | HR 50 | Temp 97.6°F | Ht 62.0 in | Wt 150.0 lb

## 2018-06-02 DIAGNOSIS — M25561 Pain in right knee: Secondary | ICD-10-CM

## 2018-06-02 DIAGNOSIS — E785 Hyperlipidemia, unspecified: Secondary | ICD-10-CM

## 2018-06-02 DIAGNOSIS — M25562 Pain in left knee: Secondary | ICD-10-CM | POA: Diagnosis not present

## 2018-06-02 DIAGNOSIS — M8580 Other specified disorders of bone density and structure, unspecified site: Secondary | ICD-10-CM

## 2018-06-02 DIAGNOSIS — M1711 Unilateral primary osteoarthritis, right knee: Secondary | ICD-10-CM | POA: Diagnosis not present

## 2018-06-02 MED ORDER — PREGABALIN 225 MG PO CAPS: 225.0000 mg | ORAL_CAPSULE | Freq: Every day | ORAL | 5 refills | Status: DC

## 2018-06-02 MED ORDER — TRIAMTERENE-HCTZ 37.5-25 MG PO TABS: 1.0000 | ORAL_TABLET | Freq: Every day | ORAL | 3 refills | Status: DC

## 2018-06-02 MED ORDER — RALOXIFENE HCL 60 MG PO TABS: 60.0000 mg | ORAL_TABLET | Freq: Every day | ORAL | 1 refills | Status: DC

## 2018-06-02 MED ORDER — ROPINIROLE HCL 2 MG PO TABS: 2.0000 mg | ORAL_TABLET | Freq: Two times a day (BID) | ORAL | 1 refills | Status: DC

## 2018-06-02 MED ORDER — PREGABALIN 225 MG PO CAPS: ORAL_CAPSULE | ORAL | 5 refills | Status: DC

## 2018-06-02 MED ORDER — ATORVASTATIN CALCIUM 40 MG PO TABS: 40.0000 mg | ORAL_TABLET | Freq: Every day | ORAL | 1 refills | Status: DC

## 2018-06-02 MED ORDER — AMLODIPINE BESYLATE 10 MG PO TABS: 10.0000 mg | ORAL_TABLET | Freq: Every day | ORAL | 1 refills | Status: DC

## 2018-06-02 MED ORDER — CELECOXIB 200 MG PO CAPS: 200.0000 mg | ORAL_CAPSULE | Freq: Two times a day (BID) | ORAL | 1 refills | Status: DC

## 2018-06-02 NOTE — Patient Instructions (Signed)
Drink a bottle of Tonic Water every evening to help with leg cramps at night.   Heart-Healthy Eating Plan Heart-healthy meal planning includes:  Eating less unhealthy fats.  Eating more healthy fats.  Making other changes in your diet. Talk with your doctor or a diet specialist (dietitian) to create an eating plan that is right for you. What is my plan? Your doctor may recommend an eating plan that includes:  Total fat: ______% or less of total calories a day.  Saturated fat: ______% or less of total calories a day.  Cholesterol: less than _________mg a day. What are tips for following this plan? Cooking Avoid frying your food. Try to bake, boil, grill, or broil it instead. You can also reduce fat by:  Removing the skin from poultry.  Removing all visible fats from meats.  Steaming vegetables in water or broth. Meal planning   At meals, divide your plate into four equal parts: ? Fill one-half of your plate with vegetables and green salads. ? Fill one-fourth of your plate with whole grains. ? Fill one-fourth of your plate with lean protein foods.  Eat 4-5 servings of vegetables per day. A serving of vegetables is: ? 1 cup of raw or cooked vegetables. ? 2 cups of raw leafy greens.  Eat 4-5 servings of fruit per day. A serving of fruit is: ? 1 medium whole fruit. ?  cup of dried fruit. ?  cup of fresh, frozen, or canned fruit. ?  cup of 100% fruit juice.  Eat more foods that have soluble fiber. These are apples, broccoli, carrots, beans, peas, and barley. Try to get 20-30 g of fiber per day.  Eat 4-5 servings of nuts, legumes, and seeds per week: ? 1 serving of dried beans or legumes equals  cup after being cooked. ? 1 serving of nuts is  cup. ? 1 serving of seeds equals 1 tablespoon. General information  Eat more home-cooked food. Eat less restaurant, buffet, and fast food.  Limit or avoid alcohol.  Limit foods that are high in starch and sugar.  Avoid  fried foods.  Lose weight if you are overweight.  Keep track of how much salt (sodium) you eat. This is important if you have high blood pressure. Ask your doctor to tell you more about this.  Try to add vegetarian meals each week. Fats  Choose healthy fats. These include olive oil and canola oil, flaxseeds, walnuts, almonds, and seeds.  Eat more omega-3 fats. These include salmon, mackerel, sardines, tuna, flaxseed oil, and ground flaxseeds. Try to eat fish at least 2 times each week.  Check food labels. Avoid foods with trans fats or high amounts of saturated fat.  Limit saturated fats. ? These are often found in animal products, such as meats, butter, and cream. ? These are also found in plant foods, such as palm oil, palm kernel oil, and coconut oil.  Avoid foods with partially hydrogenated oils in them. These have trans fats. Examples are stick margarine, some tub margarines, cookies, crackers, and other baked goods. What foods can I eat? Fruits All fresh, canned (in natural juice), or frozen fruits. Vegetables Fresh or frozen vegetables (raw, steamed, roasted, or grilled). Green salads. Grains Most grains. Choose whole wheat and whole grains most of the time. Rice and pasta, including brown rice and pastas made with whole wheat. Meats and other proteins Lean, well-trimmed beef, veal, pork, and lamb. Chicken and [place] without skin. All fish and shellfish. Wild duck, rabbit, pheasant,  and venison. Egg whites or low-cholesterol egg substitutes. Dried beans, peas, lentils, and tofu. Seeds and most nuts. Dairy Low-fat or nonfat cheeses, including ricotta and mozzarella. Skim or 1% milk that is liquid, powdered, or evaporated. Buttermilk that is made with low-fat milk. Nonfat or low-fat yogurt. Fats and oils Non-hydrogenated (trans-free) margarines. Vegetable oils, including soybean, sesame, sunflower, olive, peanut, safflower, corn, canola, and cottonseed. Salad dressings or  mayonnaise made with a vegetable oil. Beverages Mineral water. Coffee and tea. Diet carbonated beverages. Sweets and desserts Sherbet, gelatin, and fruit ice. Small amounts of dark chocolate. Limit all sweets and desserts. Seasonings and condiments All seasonings and condiments. The items listed above may not be a complete list of foods and drinks you can eat. Contact a dietitian for more options. What foods should I avoid? Fruits Canned fruit in heavy syrup. Fruit in cream or butter sauce. Fried fruit. Limit coconut. Vegetables Vegetables cooked in cheese, cream, or butter sauce. Fried vegetables. Grains Breads that are made with saturated or trans fats, oils, or whole milk. Croissants. Sweet rolls. Donuts. High-fat crackers, such as cheese crackers. Meats and other proteins Fatty meats, such as hot dogs, ribs, sausage, bacon, rib-eye roast or steak. High-fat deli meats, such as salami and bologna. Caviar. Domestic duck and goose. Organ meats, such as liver. Dairy Cream, sour cream, cream cheese, and creamed cottage cheese. Whole-milk cheeses. Whole or 2% milk that is liquid, evaporated, or condensed. Whole buttermilk. Cream sauce or high-fat cheese sauce. Yogurt that is made from whole milk. Fats and oils Meat fat, or shortening. Cocoa butter, hydrogenated oils, palm oil, coconut oil, palm kernel oil. Solid fats and shortenings, including bacon fat, salt pork, lard, and butter. Nondairy cream substitutes. Salad dressings with cheese or sour cream. Beverages Regular sodas and juice drinks with added sugar. Sweets and desserts Frosting. Pudding. Cookies. Cakes. Pies. Milk chocolate or white chocolate. Buttered syrups. Full-fat ice cream or ice cream drinks. The items listed above may not be a complete list of foods and drinks to avoid. Contact a dietitian for more information. Summary  Heart-healthy meal planning includes eating less unhealthy fats, eating more healthy fats, and making  other changes in your diet.  Eat a balanced diet. This includes fruits and vegetables, low-fat or nonfat dairy, lean protein, nuts and legumes, whole grains, and heart-healthy oils and fats. This information is not intended to replace advice given to you by your health care provider. Make sure you discuss any questions you have with your health care provider. Document Released: 09/16/2011 Document Revised: 04/24/2017 Document Reviewed: 04/24/2017 Elsevier Interactive Patient Education  2019 [employer].

## 2018-06-02 NOTE — Progress Notes (Signed)
Subjective:  Patient ID: Anita Perez,  female    DOB: 1935-10-23  Age: 83 y.o.    CC: Medical Management of Chronic Issues   HPI Anita Perez presents for  follow-up of hypertension. Patient has no history of headache chest pain or shortness of breath or recent cough. Patient also denies symptoms of TIA such as numbness weakness lateralizing. Patient denies side effects from medication. States taking it regularly. Readings at home are always good.   Patient also  in for follow-up of elevated cholesterol. Doing well without complaints on current medication. Denies side effects  including myalgia and arthralgia and nausea. Also in today for liver function testing. Currently no chest pain, shortness of breath or other cardiovascular related symptoms noted.   Arthritis pain better now on Lyrica.   [name] has a past medical history of Arthritis, Colon polyps, DDD (degenerative disc disease), lumbar, Diverticulitis (1990s), GERD (gastroesophageal reflux disease), Hiatal hernia, Hyperlipidemia, Hypertension, Osteopenia, Rectal carcinoma ([place]), and Small bowel obstruction ([place]) (04/25/2016).   She has a past surgical history that includes intestinal  tear; Appendectomy; Cosmetic surgery (Left); Lumbar laminectomy/decompression microdiscectomy (Left, 06/14/2013); Knee arthroscopy (Right); Tumor removal (Left); Back surgery; Breast cyst excision (Left); Colostomy (1990s); Colostomy takedown (1990s); Colonoscopy w/ biopsies and polypectomy; and Colon surgery.   Her family history includes Cancer in her daughter, sister, and sister; Early death in her brother; Hypertension in her father and mother; Kidney disease in her father and mother.She reports that she quit smoking about 35 years ago. Her smoking use included cigarettes. She has a 2.88 pack-year smoking history. She quit smokeless tobacco use about 72 years ago.  Her smokeless tobacco use included snuff. She reports current alcohol  use. She reports that she does not use drugs.  Current Outpatient Medications on File Prior to Visit  Medication Sig Dispense Refill  . aspirin 81 MG tablet Take 81 mg by mouth daily.    . Calcium Carbonate-Vitamin D (CALCIUM + D PO) Take 600 mg by mouth daily.    . Cholecalciferol (VITAMIN D) 2000 units CAPS Take 2,000 Units by mouth daily.     No current facility-administered medications on file prior to visit.     ROS Review of Systems  Constitutional: Negative.   HENT: Negative for congestion.   Eyes: Negative for visual disturbance.  Respiratory: Negative for shortness of breath.   Cardiovascular: Negative for chest pain.  Gastrointestinal: Negative for abdominal pain, constipation, diarrhea, nausea and vomiting.  Genitourinary: Negative for difficulty urinating.  Musculoskeletal: Negative for arthralgias and myalgias.  Neurological: Negative for headaches.  Psychiatric/Behavioral: Negative for sleep disturbance.    Objective:  BP 135/72   Pulse (!) 50   Temp 97.6 F (36.4 C) (Oral)   Ht _0  (1.575 m)   Wt 150 lb (68 kg)   BMI 27.44 kg/m   BP Readings from Last 3 Encounters:  06/02/18 135/72  03/12/18 (!) 157/80  01/26/18 (!) 159/84    Wt Readings from Last 3 Encounters:  06/02/18 150 lb (68 kg)  03/12/18 154 lb (69.9 kg)  01/26/18 152 lb (68.9 kg)     Physical Exam Constitutional:      General: She is not in acute distress.    Appearance: She is well-developed.  HENT:     Head: Normocephalic and atraumatic.     Right Ear: External ear normal.     Left Ear: External ear normal.     Nose: Nose normal.  Eyes:  Conjunctiva/sclera: Conjunctivae normal.     Pupils: Pupils are equal, round, and reactive to light.  Neck:     Musculoskeletal: Normal range of motion and neck supple.     Thyroid: No thyromegaly.  Cardiovascular:     Rate and Rhythm: Normal rate and regular rhythm.     Heart sounds: Normal heart sounds. No murmur.  Pulmonary:      Effort: Pulmonary effort is normal. No respiratory distress.     Breath sounds: Normal breath sounds. No wheezing or rales.  Abdominal:     General: Bowel sounds are normal. There is no distension.     Palpations: Abdomen is soft.     Tenderness: There is no abdominal tenderness.  Lymphadenopathy:     Cervical: No cervical adenopathy.  Skin:    General: Skin is warm and dry.  Neurological:     Mental Status: She is alert and oriented to person, place, and time.     Deep Tendon Reflexes: Reflexes are normal and symmetric.  Psychiatric:        Behavior: Behavior normal.        Thought Content: Thought content normal.        Judgment: Judgment normal.         Assessment & Plan:   Anita Perez was seen today for medical management of chronic issues.  Diagnoses and all orders for this visit:  Left knee pain, unspecified chronicity -     Cancel: DG Knee 1-2 Views Left; Future  Acute pain of right knee -     celecoxib (CELEBREX) 200 MG capsule; Take 1 capsule (200 mg total) by mouth 2 (two) times daily.  Hyperlipidemia with target LDL less than 100 -     atorvastatin (LIPITOR) 40 MG tablet; Take 1 tablet (40 mg total) by mouth daily. -     CBC with Differential/Platelet -     CMP14+EGFR -     Lipid panel  Osteopenia determined by x-ray -     raloxifene (EVISTA) 60 MG tablet; Take 1 tablet (60 mg total) by mouth daily. For bone health and breast cancer prevention  Other orders -     amLODipine (NORVASC) 10 MG tablet; Take 1 tablet (10 mg total) by mouth daily. -     rOPINIRole (REQUIP) 2 MG tablet; Take 1 tablet (2 mg total) by mouth 2 (two) times daily. -     triamterene-hydrochlorothiazide (MAXZIDE-25) 37.5-25 MG tablet; Take 1 tablet by mouth daily. -     Discontinue: pregabalin (LYRICA) 225 MG capsule; 1 at bedtime for 1 week, then 2 at bedtime for a week, then 3 at bedtime for a week, then 4 at bedtime. -     pregabalin (LYRICA) 225 MG capsule; Take 1 capsule (225 mg total)  by mouth daily.   I have discontinued Anita Perez. Anita Perez's pregabalin, predniSONE, and meloxicam. I have also changed her pregabalin. Additionally, I am having her maintain her aspirin, Calcium Carbonate-Vitamin D (CALCIUM + D PO), Vitamin D, celecoxib, atorvastatin, raloxifene, amLODipine, rOPINIRole, and triamterene-hydrochlorothiazide.  Meds ordered this encounter  Medications  . celecoxib (CELEBREX) 200 MG capsule    Sig: Take 1 capsule (200 mg total) by mouth 2 (two) times daily.    Dispense:  180 capsule    Refill:  1  . atorvastatin (LIPITOR) 40 MG tablet    Sig: Take 1 tablet (40 mg total) by mouth daily.    Dispense:  90 tablet    Refill:  1  . raloxifene (  EVISTA) 60 MG tablet    Sig: Take 1 tablet (60 mg total) by mouth daily. For bone health and breast cancer prevention    Dispense:  90 tablet    Refill:  1  . amLODipine (NORVASC) 10 MG tablet    Sig: Take 1 tablet (10 mg total) by mouth daily.    Dispense:  90 tablet    Refill:  1  . rOPINIRole (REQUIP) 2 MG tablet    Sig: Take 1 tablet (2 mg total) by mouth 2 (two) times daily.    Dispense:  180 tablet    Refill:  1  . triamterene-hydrochlorothiazide (MAXZIDE-25) 37.5-25 MG tablet    Sig: Take 1 tablet by mouth daily.    Dispense:  90 tablet    Refill:  3  . DISCONTD: pregabalin (LYRICA) 225 MG capsule    Sig: 1 at bedtime for 1 week, then 2 at bedtime for a week, then 3 at bedtime for a week, then 4 at bedtime.    Dispense:  30 capsule    Refill:  5  . pregabalin (LYRICA) 225 MG capsule    Sig: Take 1 capsule (225 mg total) by mouth daily.    Dispense:  30 capsule    Refill:  5    Corrected prescription. Please void previous pregabalin scrip with titration sig. Thanks, WS   Do not take meloxicam and celebrex. Choose one or the other.  Celebrex ordered.  Follow-up: Return in about 6 months (around 12/03/2018) for Arthritis, hypertension, cholesterol.  Claretta Fraise, M.D.

## 2018-06-03 ENCOUNTER — Ambulatory Visit: Payer: Medicare Other | Admitting: *Deleted

## 2018-06-03 DIAGNOSIS — R293 Abnormal posture: Secondary | ICD-10-CM

## 2018-06-03 DIAGNOSIS — M5442 Lumbago with sciatica, left side: Secondary | ICD-10-CM | POA: Diagnosis not present

## 2018-06-03 LAB — CBC WITH DIFFERENTIAL/PLATELET
BASOS ABS: 0.1 10*3/uL (ref 0.0–0.2)
Basos: 1 %
EOS (ABSOLUTE): 0.1 10*3/uL (ref 0.0–0.4)
Eos: 1 %
Hematocrit: 36.1 % (ref 34.0–46.6)
Hemoglobin: 11.8 g/dL (ref 11.1–15.9)
Immature Grans (Abs): 0 10*3/uL (ref 0.0–0.1)
Immature Granulocytes: 0 %
Lymphocytes Absolute: 1.9 10*3/uL (ref 0.7–3.1)
Lymphs: 30 %
MCH: 27.5 pg (ref 26.6–33.0)
MCHC: 32.7 g/dL (ref 31.5–35.7)
MCV: 84 fL (ref 79–97)
Monocytes Absolute: 0.6 10*3/uL (ref 0.1–0.9)
Monocytes: 9 %
Neutrophils Absolute: 3.7 10*3/uL (ref 1.4–7.0)
Neutrophils: 59 %
Platelets: 282 10*3/uL (ref 150–450)
RBC: 4.29 x10E6/uL (ref 3.77–5.28)
RDW: 13.8 % (ref 11.7–15.4)
WBC: 6.4 10*3/uL (ref 3.4–10.8)

## 2018-06-03 LAB — CMP14+EGFR
ALT: 12 IU/L (ref 0–32)
AST: 23 IU/L (ref 0–40)
Albumin/Globulin Ratio: 1.7 (ref 1.2–2.2)
Albumin: 4.2 g/dL (ref 3.7–4.7)
Alkaline Phosphatase: 77 IU/L (ref 39–117)
BUN/Creatinine Ratio: 24 (ref 12–28)
BUN: 32 mg/dL — ABNORMAL HIGH (ref 8–27)
Bilirubin Total: 0.4 mg/dL (ref 0.0–1.2)
CO2: 25 mmol/L (ref 20–29)
Calcium: 9.4 mg/dL (ref 8.7–10.3)
Chloride: 102 mmol/L (ref 96–106)
Creatinine, Ser: 1.32 mg/dL — ABNORMAL HIGH (ref 0.57–1.00)
GFR calc Af Amer: 44 mL/min/{1.73_m2} — ABNORMAL LOW (ref >59)
GFR calc non Af Amer: 38 mL/min/{1.73_m2} — ABNORMAL LOW (ref >59)
Globulin, Total: 2.5 g/dL (ref 1.5–4.5)
Glucose: 101 mg/dL — ABNORMAL HIGH (ref 65–99)
POTASSIUM: 4.5 mmol/L (ref 3.5–5.2)
Sodium: 139 mmol/L (ref 134–144)
Total Protein: 6.7 g/dL (ref 6.0–8.5)

## 2018-06-03 LAB — LIPID PANEL
Chol/HDL Ratio: 4.4 ratio (ref 0.0–4.4)
Cholesterol, Total: 205 mg/dL — ABNORMAL HIGH (ref 100–199)
HDL: 47 mg/dL (ref >39)
LDL Calculated: 130 mg/dL — ABNORMAL HIGH (ref 0–99)
Triglycerides: 138 mg/dL (ref 0–149)
VLDL Cholesterol Cal: 28 mg/dL (ref 5–40)
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
## 2018-06-12 ENCOUNTER — Encounter: Payer: Self-pay | Admitting: Family Medicine

## 2018-07-27 ENCOUNTER — Encounter: Payer: Medicare Other | Admitting: *Deleted

## 2018-08-17 ENCOUNTER — Other Ambulatory Visit: Payer: Self-pay | Admitting: Family Medicine

## 2018-08-17 DIAGNOSIS — M8580 Other specified disorders of bone density and structure, unspecified site: Secondary | ICD-10-CM

## 2018-08-17 MED ORDER — RALOXIFENE HCL 60 MG PO TABS: 60.0000 mg | ORAL_TABLET | Freq: Every day | ORAL | 1 refills | Status: DC

## 2018-08-17 NOTE — Telephone Encounter (Signed)
What is the name of the medication? raloxifene (EVISTA) 60 MG tablet  Have you contacted your pharmacy to request a refill? yes  Which pharmacy would you like this sent to? [facility] mail order   Patient notified that their request is being sent to the clinical staff for review and that they should receive a call once it is complete. If they do not receive a call within 24 hours they can check with their pharmacy or our office.

## 2018-08-17 NOTE — Telephone Encounter (Signed)
Pt aware refill sent to pharmacy 

## 2018-11-02 ENCOUNTER — Telehealth: Payer: Self-pay | Admitting: Family Medicine

## 2018-11-02 NOTE — Telephone Encounter (Signed)
Pt. Needs to be seen for this. Thanks, WS 

## 2018-11-03 NOTE — Telephone Encounter (Signed)
Patient notified and offered appt. Pt states that she has an appt on August 19th and she would be ok to wait until then. She has been having these leg pains for quite some time and its only at night. I advised that if pains worsen or if she decides she wants to come in before then to contact office and we would give her an early appt. Patient verbalized understanding.

## 2018-11-17 ENCOUNTER — Ambulatory Visit (INDEPENDENT_AMBULATORY_CARE_PROVIDER_SITE_OTHER): Payer: Medicare Other | Admitting: *Deleted

## 2018-11-17 DIAGNOSIS — Z0000 Encounter for general adult medical examination without abnormal findings: Secondary | ICD-10-CM | POA: Diagnosis not present

## 2018-11-17 NOTE — Patient Instructions (Signed)
Preventive Care 81 Years and Older, Female Preventive care refers to lifestyle choices and visits with your health care provider that can promote health and wellness. This includes:  A yearly physical exam. This is also called an annual well check.  Regular dental and eye exams.  Immunizations.  Screening for certain conditions.  Healthy lifestyle choices, such as diet and exercise. What can I expect for my preventive care visit? Physical exam Your health care provider will check:  Height and weight. These may be used to calculate body mass index (BMI), which is a measurement that tells if you are at a healthy weight.  Heart rate and blood pressure.  Your skin for abnormal spots. Counseling Your health care provider may ask you questions about:  Alcohol, tobacco, and drug use.  Emotional well-being.  Home and relationship well-being.  Sexual activity.  Eating habits.  History of falls.  Memory and ability to understand (cognition).  Work and work Statistician.  Pregnancy and menstrual history. What immunizations do I need?  Influenza (flu) vaccine  This is recommended every year. Tetanus, diphtheria, and pertussis (Tdap) vaccine  You may need a Td booster every 10 years. Varicella (chickenpox) vaccine  You may need this vaccine if you have not already been vaccinated. Zoster (shingles) vaccine  You may need this after age 33. Pneumococcal conjugate (PCV13) vaccine  One dose is recommended after age 33. Pneumococcal polysaccharide (PPSV23) vaccine  One dose is recommended after age 72. Measles, mumps, and rubella (MMR) vaccine  You may need at least one dose of MMR if you were born in 1957 or later. You may also need a second dose. Meningococcal conjugate (MenACWY) vaccine  You may need this if you have certain conditions. Hepatitis A vaccine  You may need this if you have certain conditions or if you travel or work in places where you may be exposed  to hepatitis A. Hepatitis B vaccine  You may need this if you have certain conditions or if you travel or work in places where you may be exposed to hepatitis B. Haemophilus influenzae type b (Hib) vaccine  You may need this if you have certain conditions. You may receive vaccines as individual doses or as more than one vaccine together in one shot (combination vaccines). Talk with your health care provider about the risks and benefits of combination vaccines. What tests do I need? Blood tests  Lipid and cholesterol levels. These may be checked every 5 years, or more frequently depending on your overall health.  Hepatitis C test.  Hepatitis B test. Screening  Lung cancer screening. You may have this screening every year starting at age 39 if you have a 30-pack-year history of smoking and currently smoke or have quit within the past 15 years.  Colorectal cancer screening. All adults should have this screening starting at age 36 and continuing until age 15. Your health care provider may recommend screening at age 23 if you are at increased risk. You will have tests every 1-10 years, depending on your results and the type of screening test.  Diabetes screening. This is done by checking your blood sugar (glucose) after you have not eaten for a while (fasting). You may have this done every 1-3 years.  Mammogram. This may be done every 1-2 years. Talk with your health care provider about how often you should have regular mammograms.  BRCA-related cancer screening. This may be done if you have a family history of breast, ovarian, tubal, or peritoneal cancers.  Other tests  Sexually transmitted disease (STD) testing.  Bone density scan. This is done to screen for osteoporosis. You may have this done starting at age 55. Follow these instructions at home: Eating and drinking  Eat a diet that includes fresh fruits and vegetables, whole grains, lean protein, and low-fat dairy products. Limit  your intake of foods with high amounts of sugar, saturated fats, and salt.  Take vitamin and mineral supplements as recommended by your health care provider.  Do not drink alcohol if your health care provider tells you not to drink.  If you drink alcohol: ? Limit how much you have to 0-1 drink a day. ? Be aware of how much alcohol is in your drink. In the [place] one drink equals one 12 oz bottle of beer (355 mL), one 5 oz glass of wine (148 mL), or one 1 oz glass of hard liquor (44 mL). Lifestyle  Take daily care of your teeth and gums.  Stay active. Exercise for at least 30 minutes on 5 or more days each week.  Do not use any products that contain nicotine or tobacco, such as cigarettes, e-cigarettes, and chewing tobacco. If you need help quitting, ask your health care provider.  If you are sexually active, practice safe sex. Use a condom or other form of protection in order to prevent STIs (sexually transmitted infections).  Talk with your health care provider about taking a low-dose aspirin or statin. What's next?  Go to your health care provider once a year for a well check visit.  Ask your health care provider how often you should have your eyes and teeth checked.  Stay up to date on all vaccines. This information is not intended to replace advice given to you by your health care provider. Make sure you discuss any questions you have with your health care provider. Document Released: 04/13/2015 Document Revised: 03/11/2018 Document Reviewed: 03/11/2018 Elsevier Patient Education  2020 [employer].
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
## 2018-12-07 ENCOUNTER — Encounter: Payer: Self-pay | Admitting: Family Medicine

## 2018-12-07 ENCOUNTER — Ambulatory Visit (INDEPENDENT_AMBULATORY_CARE_PROVIDER_SITE_OTHER): Payer: Medicare Other | Admitting: Family Medicine

## 2018-12-07 ENCOUNTER — Other Ambulatory Visit: Payer: Self-pay

## 2018-12-07 VITALS — BP 160/92 | HR 77 | Temp 99.5°F | Resp 18 | Ht 62.0 in | Wt 147.0 lb

## 2018-12-07 DIAGNOSIS — R202 Paresthesia of skin: Secondary | ICD-10-CM | POA: Diagnosis not present

## 2018-12-07 DIAGNOSIS — E785 Hyperlipidemia, unspecified: Secondary | ICD-10-CM | POA: Diagnosis not present

## 2018-12-07 DIAGNOSIS — Z23 Encounter for immunization: Secondary | ICD-10-CM

## 2018-12-07 DIAGNOSIS — M1710 Unilateral primary osteoarthritis, unspecified knee: Secondary | ICD-10-CM

## 2018-12-07 DIAGNOSIS — I10 Essential (primary) hypertension: Secondary | ICD-10-CM

## 2018-12-07 MED ORDER — TRIAMTERENE-HCTZ 37.5-25 MG PO TABS: 1.0000 | ORAL_TABLET | Freq: Every day | ORAL | 3 refills | Status: DC

## 2018-12-07 MED ORDER — AMLODIPINE BESYLATE 10 MG PO TABS: 10.0000 mg | ORAL_TABLET | Freq: Every day | ORAL | 1 refills | Status: DC

## 2018-12-07 MED ORDER — CELECOXIB 200 MG PO CAPS: 200.0000 mg | ORAL_CAPSULE | Freq: Two times a day (BID) | ORAL | 1 refills | Status: DC

## 2018-12-07 NOTE — Patient Instructions (Signed)
Discontinue Lyrica.

## 2018-12-07 NOTE — Progress Notes (Signed)
Subjective:  Patient ID: Anita Perez, female    DOB: 1935-10-18  Age: 83 y.o. MRN: [id number]  CC: Hypertension (6 month follow up )   HPI Anita Perez presents for  follow-up of hypertension. Patient has no history of headache chest pain or shortness of breath or recent cough. Patient also denies symptoms of TIA such as focal numbness or weakness. Patient denies side effects from medication. States taking it regularly. BP at home runs 135/70 Numbness and tingling in left leg at shin for several months. Worse at night. No relief with Lyrica. Concerned it is making her dizzy.  in for follow-up of elevated cholesterol. Doing well without complaints on current medication. Denies side effects of statin including myalgia and arthralgia and nausea. Currently no chest pain, shortness of breath or other cardiovascular related symptoms noted.   [name] has a past medical history of Arthritis, Colon polyps, DDD (degenerative disc disease), lumbar, Diverticulitis (1990s), GERD (gastroesophageal reflux disease), Hiatal hernia, Hyperlipidemia, Hypertension, Osteopenia, Rectal carcinoma ([place]), and Small bowel obstruction ([place]) (04/25/2016).   She has a past surgical history that includes intestinal  tear; Appendectomy; Cosmetic surgery (Left); Lumbar laminectomy/decompression microdiscectomy (Left, 06/14/2013); Knee arthroscopy (Right); Tumor removal (Left); Back surgery; Breast cyst excision (Left); Colostomy (1990s); Colostomy takedown (1990s); Colonoscopy w/ biopsies and polypectomy; and Colon surgery.   Her family history includes Cancer in her daughter, sister, and sister; Early death in her brother; Hypertension in her father and mother; Kidney disease in her father and mother.She reports that she quit smoking about 36 years ago. Her smoking use included cigarettes. She has a 2.88 pack-year smoking history. She quit smokeless tobacco use about 72 years ago.  Her smokeless tobacco use included  snuff. She reports previous alcohol use. She reports that she does not use drugs.  Current Outpatient Medications on File Prior to Visit  Medication Sig Dispense Refill  . aspirin 81 MG tablet Take 81 mg by mouth daily.    Marland Kitchen atorvastatin (LIPITOR) 40 MG tablet Take 1 tablet (40 mg total) by mouth daily. 90 tablet 1  . Calcium Carbonate-Vitamin D (CALCIUM + D PO) Take 600 mg by mouth daily.    . Cholecalciferol (VITAMIN D) 2000 units CAPS Take 2,000 Units by mouth daily.    . Multiple Vitamin (MULTIVITAMIN) tablet Take 1 tablet by mouth daily.    . raloxifene (EVISTA) 60 MG tablet Take 1 tablet (60 mg total) by mouth daily. For bone health and breast cancer prevention 90 tablet 1   No current facility-administered medications on file prior to visit.     ROS Review of Systems  Constitutional: Negative.   HENT: Positive for hearing loss. Negative for congestion.   Eyes: Negative for visual disturbance.  Respiratory: Negative for shortness of breath.   Cardiovascular: Negative for chest pain.  Gastrointestinal: Negative for abdominal pain, constipation, diarrhea, nausea and vomiting.  Genitourinary: Negative for difficulty urinating.  Musculoskeletal: Negative for arthralgias and myalgias.  Neurological: Positive for numbness. Negative for headaches.  Psychiatric/Behavioral: Negative for sleep disturbance.    Objective:  BP (!) 160/92   Pulse 77   Temp 99.5 F (37.5 C) (Temporal)   Resp 18   Ht '5\' 2"'  (1.575 m)   Wt 147 lb (66.7 kg)   SpO2 98%   BMI 26.89 kg/m   BP Readings from Last 3 Encounters:  12/07/18 (!) 160/92  06/02/18 135/72  03/12/18 (!) 157/80    Wt Readings from Last 3 Encounters:  12/07/18 147  lb (66.7 kg)  06/02/18 150 lb (68 kg)  03/12/18 154 lb (69.9 kg)     Physical Exam Constitutional:      General: She is not in acute distress.    Appearance: She is well-developed.  HENT:     Head: Normocephalic and atraumatic.     Right Ear: External ear  normal.     Left Ear: External ear normal.     Nose: Nose normal.  Eyes:     Conjunctiva/sclera: Conjunctivae normal.     Pupils: Pupils are equal, round, and reactive to light.  Neck:     Musculoskeletal: Normal range of motion and neck supple.     Thyroid: No thyromegaly.  Cardiovascular:     Rate and Rhythm: Normal rate and regular rhythm.     Heart sounds: Normal heart sounds. No murmur.  Pulmonary:     Effort: Pulmonary effort is normal. No respiratory distress.     Breath sounds: Normal breath sounds. No wheezing or rales.  Abdominal:     General: Bowel sounds are normal. There is no distension.     Palpations: Abdomen is soft.     Tenderness: There is no abdominal tenderness.  Lymphadenopathy:     Cervical: No cervical adenopathy.  Skin:    General: Skin is warm and dry.  Neurological:     Mental Status: She is alert and oriented to person, place, and time.     Deep Tendon Reflexes: Reflexes are normal and symmetric.  Psychiatric:        Behavior: Behavior normal.        Thought Content: Thought content normal.        Judgment: Judgment normal.       Assessment & Plan:   Anita Perez was seen today for hypertension.  Diagnoses and all orders for this visit:  Hyperlipidemia with target LDL less than 100 -     CBC with Differential/Platelet -     CMP14+EGFR -     Lipid panel  Essential hypertension, benign  Primary osteoarthritis of knee, unspecified laterality -     celecoxib (CELEBREX) 200 MG capsule; Take 1 capsule (200 mg total) by mouth 2 (two) times daily.  Need for immunization against influenza -     Flu Vaccine QUAD High Dose(Fluad)  Paresthesias -     Nerve conduction test; Future  Other orders -     amLODipine (NORVASC) 10 MG tablet; Take 1 tablet (10 mg total) by mouth daily. -     triamterene-hydrochlorothiazide (MAXZIDE-25) 37.5-25 MG tablet; Take 1 tablet by mouth daily. -     Tdap vaccine greater than or equal to 7yo IM   Allergies as of  12/07/2018      Reactions   Diclofenac    SOB, nervous   Pepto-bismol [bismuth] Other (See Comments)   Turned mouth black inside   Ropinirole Other (See Comments)   Jittery      Medication List       Accurate as of December 07, 2018  2:36 PM. If you have any questions, ask your nurse or doctor.        STOP taking these medications   pregabalin 225 MG capsule Commonly known as: LYRICA Stopped by: Claretta Fraise, MD     TAKE these medications   amLODipine 10 MG tablet Commonly known as: NORVASC Take 1 tablet (10 mg total) by mouth daily.   aspirin 81 MG tablet Take 81 mg by mouth daily.   atorvastatin 40 MG  tablet Commonly known as: LIPITOR Take 1 tablet (40 mg total) by mouth daily.   CALCIUM + D PO Take 600 mg by mouth daily.   celecoxib 200 MG capsule Commonly known as: CeleBREX Take 1 capsule (200 mg total) by mouth 2 (two) times daily.   multivitamin tablet Take 1 tablet by mouth daily.   raloxifene 60 MG tablet Commonly known as: Evista Take 1 tablet (60 mg total) by mouth daily. For bone health and breast cancer prevention   triamterene-hydrochlorothiazide 37.5-25 MG tablet Commonly known as: MAXZIDE-25 Take 1 tablet by mouth daily.   Vitamin D 50 MCG (2000 UT) Caps Take 2,000 Units by mouth daily.       Meds ordered this encounter  Medications  . celecoxib (CELEBREX) 200 MG capsule    Sig: Take 1 capsule (200 mg total) by mouth 2 (two) times daily.    Dispense:  180 capsule    Refill:  1  . amLODipine (NORVASC) 10 MG tablet    Sig: Take 1 tablet (10 mg total) by mouth daily.    Dispense:  90 tablet    Refill:  1  . triamterene-hydrochlorothiazide (MAXZIDE-25) 37.5-25 MG tablet    Sig: Take 1 tablet by mouth daily.    Dispense:  90 tablet    Refill:  3      Follow-up: Return in about 6 weeks (around 01/18/2019) for hypertension.  Claretta Fraise, M.D.

## 2018-12-08 LAB — CMP14+EGFR
ALT: 12 IU/L (ref 0–32)
AST: 19 IU/L (ref 0–40)
Albumin/Globulin Ratio: 1.6 (ref 1.2–2.2)
Albumin: 4.1 g/dL (ref 3.6–4.6)
Alkaline Phosphatase: 67 IU/L (ref 39–117)
BUN/Creatinine Ratio: 19 (ref 12–28)
BUN: 18 mg/dL (ref 8–27)
Bilirubin Total: 0.4 mg/dL (ref 0.0–1.2)
CO2: 25 mmol/L (ref 20–29)
Calcium: 9.3 mg/dL (ref 8.7–10.3)
Chloride: 103 mmol/L (ref 96–106)
Creatinine, Ser: 0.95 mg/dL (ref 0.57–1.00)
GFR calc Af Amer: 65 mL/min/{1.73_m2} (ref >59)
GFR calc non Af Amer: 56 mL/min/{1.73_m2} — ABNORMAL LOW (ref >59)
Globulin, Total: 2.6 g/dL (ref 1.5–4.5)
Glucose: 95 mg/dL (ref 65–99)
Potassium: 3.8 mmol/L (ref 3.5–5.2)
Sodium: 142 mmol/L (ref 134–144)
Total Protein: 6.7 g/dL (ref 6.0–8.5)

## 2018-12-08 LAB — CBC WITH DIFFERENTIAL/PLATELET
Basophils Absolute: 0 10*3/uL (ref 0.0–0.2)
Basos: 1 %
EOS (ABSOLUTE): 0.1 10*3/uL (ref 0.0–0.4)
Eos: 1 %
Hematocrit: 36.3 % (ref 34.0–46.6)
Hemoglobin: 11.9 g/dL (ref 11.1–15.9)
Immature Grans (Abs): 0 10*3/uL (ref 0.0–0.1)
Immature Granulocytes: 0 %
Lymphocytes Absolute: 1.7 10*3/uL (ref 0.7–3.1)
Lymphs: 29 %
MCH: 27.8 pg (ref 26.6–33.0)
MCHC: 32.8 g/dL (ref 31.5–35.7)
MCV: 85 fL (ref 79–97)
Monocytes Absolute: 0.6 10*3/uL (ref 0.1–0.9)
Monocytes: 10 %
Neutrophils Absolute: 3.4 10*3/uL (ref 1.4–7.0)
Neutrophils: 59 %
Platelets: 247 10*3/uL (ref 150–450)
RBC: 4.28 x10E6/uL (ref 3.77–5.28)
RDW: 13.9 % (ref 11.7–15.4)
WBC: 5.8 10*3/uL (ref 3.4–10.8)

## 2018-12-08 LAB — LIPID PANEL
Chol/HDL Ratio: 4 ratio (ref 0.0–4.4)
Cholesterol, Total: 202 mg/dL — ABNORMAL HIGH (ref 100–199)
HDL: 51 mg/dL (ref >39)
LDL Chol Calc (NIH): 126 mg/dL — ABNORMAL HIGH (ref 0–99)
Triglycerides: 138 mg/dL (ref 0–149)
VLDL Cholesterol Cal: 25 mg/dL (ref 5–40)

## 2018-12-08 NOTE — Progress Notes (Signed)
Hello Anita Perez,  Your lab result is normal and/or stable.Some minor variations that are not significant are commonly marked abnormal, but do not represent any medical problem for you.  Best regards, Wilburta Milbourn, M.D.

## 2018-12-15 DIAGNOSIS — Z1231 Encounter for screening mammogram for malignant neoplasm of breast: Secondary | ICD-10-CM | POA: Diagnosis not present

## 2018-12-20 ENCOUNTER — Telehealth: Payer: Self-pay | Admitting: Family Medicine

## 2018-12-20 NOTE — Telephone Encounter (Signed)
DC lyrica

## 2018-12-20 NOTE — Telephone Encounter (Signed)
Patient aware.

## 2018-12-20 NOTE — Telephone Encounter (Signed)
Patient states that pregabalin is making her feel dizzy.  Not on current medication list.

## 2019-01-04 DIAGNOSIS — I10 Essential (primary) hypertension: Secondary | ICD-10-CM | POA: Diagnosis not present

## 2019-01-04 DIAGNOSIS — Z6826 Body mass index (BMI) 26.0-26.9, adult: Secondary | ICD-10-CM | POA: Diagnosis not present

## 2019-01-04 DIAGNOSIS — M48061 Spinal stenosis, lumbar region without neurogenic claudication: Secondary | ICD-10-CM | POA: Diagnosis not present

## 2019-01-11 DIAGNOSIS — M5440 Lumbago with sciatica, unspecified side: Secondary | ICD-10-CM | POA: Diagnosis not present

## 2019-01-11 DIAGNOSIS — I10 Essential (primary) hypertension: Secondary | ICD-10-CM | POA: Diagnosis not present

## 2019-01-11 DIAGNOSIS — Z6826 Body mass index (BMI) 26.0-26.9, adult: Secondary | ICD-10-CM | POA: Diagnosis not present

## 2019-01-11 DIAGNOSIS — M961 Postlaminectomy syndrome, not elsewhere classified: Secondary | ICD-10-CM | POA: Diagnosis not present

## 2019-01-11 DIAGNOSIS — M48061 Spinal stenosis, lumbar region without neurogenic claudication: Secondary | ICD-10-CM | POA: Diagnosis not present

## 2019-01-18 ENCOUNTER — Encounter: Payer: Self-pay | Admitting: Family Medicine

## 2019-01-18 ENCOUNTER — Ambulatory Visit: Payer: Medicare Other | Attending: Neurosurgery | Admitting: Physical Therapy

## 2019-01-18 ENCOUNTER — Other Ambulatory Visit: Payer: Self-pay

## 2019-01-18 ENCOUNTER — Ambulatory Visit (INDEPENDENT_AMBULATORY_CARE_PROVIDER_SITE_OTHER): Payer: Medicare Other | Admitting: Family Medicine

## 2019-01-18 ENCOUNTER — Encounter: Payer: Self-pay | Admitting: Physical Therapy

## 2019-01-18 VITALS — BP 137/73 | HR 79 | Temp 99.0°F | Resp 20 | Ht 62.0 in | Wt 147.0 lb

## 2019-01-18 DIAGNOSIS — M5441 Lumbago with sciatica, right side: Secondary | ICD-10-CM | POA: Insufficient documentation

## 2019-01-18 DIAGNOSIS — G8929 Other chronic pain: Secondary | ICD-10-CM | POA: Diagnosis not present

## 2019-01-18 DIAGNOSIS — I10 Essential (primary) hypertension: Secondary | ICD-10-CM

## 2019-01-18 DIAGNOSIS — G2581 Restless legs syndrome: Secondary | ICD-10-CM | POA: Diagnosis not present

## 2019-01-18 DIAGNOSIS — M5126 Other intervertebral disc displacement, lumbar region: Secondary | ICD-10-CM | POA: Diagnosis not present

## 2019-01-18 DIAGNOSIS — M6281 Muscle weakness (generalized): Secondary | ICD-10-CM | POA: Diagnosis not present

## 2019-01-18 DIAGNOSIS — R293 Abnormal posture: Secondary | ICD-10-CM | POA: Insufficient documentation

## 2019-01-18 DIAGNOSIS — M5442 Lumbago with sciatica, left side: Secondary | ICD-10-CM | POA: Diagnosis not present
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
## 2019-01-18 NOTE — Patient Instructions (Signed)

## 2019-01-18 NOTE — Progress Notes (Signed)
Subjective:  Patient ID: Anita Perez, female    DOB: 14-Sep-1935  Age: 83 y.o. MRN: [id number]  CC: Medical Management of Chronic Issues (6 week ) and Hypertension   HPI Anita Perez presents for  follow-up of hypertension. Patient has no history of headache chest pain or shortness of breath or recent cough. Patient also denies symptoms of TIA such as focal numbness or weakness. Patient denies side effects from medication. States taking it regularly.Restless leg sx. Having burning, cramping in legs. She is seeing a specialist who feels she will enefit from [facility].    [name] has a past medical history of Arthritis, Colon polyps, DDD (degenerative disc disease), lumbar, Diverticulitis (1990s), GERD (gastroesophageal reflux disease), Hiatal hernia, Hyperlipidemia, Hypertension, Osteopenia, Rectal carcinoma ([place]), and Small bowel obstruction ([place]) (04/25/2016).   She has a past surgical history that includes intestinal  tear; Appendectomy; Cosmetic surgery (Left); Lumbar laminectomy/decompression microdiscectomy (Left, 06/14/2013); Knee arthroscopy (Right); Tumor removal (Left); Back surgery; Breast cyst excision (Left); Colostomy (1990s); Colostomy takedown (1990s); Colonoscopy w/ biopsies and polypectomy; and Colon surgery.   Her family history includes Cancer in her daughter, sister, and sister; Early death in her brother; Hypertension in her father and mother; Kidney disease in her father and mother.She reports that she quit smoking about 36 years ago. Her smoking use included cigarettes. She has a 2.88 pack-year smoking history. She quit smokeless tobacco use about 72 years ago.  Her smokeless tobacco use included snuff. She reports previous alcohol use. She reports that she does not use drugs.  Current Outpatient Medications on File Prior to Visit  Medication Sig Dispense Refill  . amLODipine (NORVASC) 10 MG tablet Take 1 tablet (10 mg total) by mouth daily. 90 tablet 1  . aspirin  81 MG tablet Take 81 mg by mouth daily.    Marland Kitchen atorvastatin (LIPITOR) 40 MG tablet Take 1 tablet (40 mg total) by mouth daily. 90 tablet 1  . Calcium Carbonate-Vitamin D (CALCIUM + D PO) Take 600 mg by mouth daily.    . celecoxib (CELEBREX) 200 MG capsule Take 1 capsule (200 mg total) by mouth 2 (two) times daily. 180 capsule 1  . Cholecalciferol (VITAMIN D) 2000 units CAPS Take 2,000 Units by mouth daily.    . Multiple Vitamin (MULTIVITAMIN) tablet Take 1 tablet by mouth daily.    . raloxifene (EVISTA) 60 MG tablet Take 1 tablet (60 mg total) by mouth daily. For bone health and breast cancer prevention 90 tablet 1  . triamterene-hydrochlorothiazide (MAXZIDE-25) 37.5-25 MG tablet Take 1 tablet by mouth daily. 90 tablet 3   No current facility-administered medications on file prior to visit.     ROS Review of Systems  Constitutional: Negative.   HENT: Negative.   Eyes: Negative for visual disturbance.  Respiratory: Negative for shortness of breath.   Cardiovascular: Negative for chest pain.  Gastrointestinal: Negative for abdominal pain.  Musculoskeletal: Positive for arthralgias, back pain and myalgias.    Objective:  BP 137/73 (BP Location: Right Arm, Cuff Size: Large)   Pulse 79   Temp 99 F (37.2 C)   Resp 20   Ht 5\' 2"  (1.575 m)   Wt 147 lb (66.7 kg)   SpO2 99%   BMI 26.89 kg/m   BP Readings from Last 3 Encounters:  01/18/19 137/73  12/07/18 (!) 160/92  06/02/18 135/72    Wt Readings from Last 3 Encounters:  01/18/19 147 lb (66.7 kg)  12/07/18 147 lb (66.7 kg)  06/02/18  150 lb (68 kg)     Physical Exam Constitutional:      General: She is not in acute distress.    Appearance: She is well-developed.  HENT:     Head: Normocephalic and atraumatic.     Right Ear: External ear normal.     Left Ear: External ear normal.     Nose: Nose normal.  Eyes:     Conjunctiva/sclera: Conjunctivae normal.     Pupils: Pupils are equal, round, and reactive to light.  Neck:      Musculoskeletal: Normal range of motion and neck supple.     Thyroid: No thyromegaly.  Cardiovascular:     Rate and Rhythm: Normal rate and regular rhythm.     Heart sounds: Normal heart sounds. No murmur.  Pulmonary:     Effort: Pulmonary effort is normal. No respiratory distress.     Breath sounds: Normal breath sounds. No wheezing or rales.  Abdominal:     General: Bowel sounds are normal. There is no distension.     Palpations: Abdomen is soft.     Tenderness: There is no abdominal tenderness.  Lymphadenopathy:     Cervical: No cervical adenopathy.  Skin:    General: Skin is warm and dry.  Neurological:     Mental Status: She is alert and oriented to person, place, and time.     Deep Tendon Reflexes: Reflexes are normal and symmetric.  Psychiatric:        Behavior: Behavior normal.        Thought Content: Thought content normal.        Judgment: Judgment normal.       Assessment & Plan:   Anita Perez was seen today for medical management of chronic issues and hypertension.  Diagnoses and all orders for this visit:  Essential hypertension, benign  Lumbar disc herniation  Restless legs   Allergies as of 01/18/2019      Reactions   Diclofenac    SOB, nervous   Pepto-bismol [bismuth] Other (See Comments)   Turned mouth black inside   Ropinirole Other (See Comments)   Jittery      Medication List       Accurate as of January 18, 2019 11:59 PM. If you have any questions, ask your nurse or doctor.        amLODipine 10 MG tablet Commonly known as: NORVASC Take 1 tablet (10 mg total) by mouth daily.   aspirin 81 MG tablet Take 81 mg by mouth daily.   atorvastatin 40 MG tablet Commonly known as: LIPITOR Take 1 tablet (40 mg total) by mouth daily.   CALCIUM + D PO Take 600 mg by mouth daily.   celecoxib 200 MG capsule Commonly known as: CeleBREX Take 1 capsule (200 mg total) by mouth 2 (two) times daily.   multivitamin tablet Take 1 tablet by  mouth daily.   raloxifene 60 MG tablet Commonly known as: Evista Take 1 tablet (60 mg total) by mouth daily. For bone health and breast cancer prevention   triamterene-hydrochlorothiazide 37.5-25 MG tablet Commonly known as: MAXZIDE-25 Take 1 tablet by mouth daily.   Vitamin D 50 MCG (2000 UT) Caps Take 2,000 Units by mouth daily.       No orders of the defined types were placed in this encounter.      Follow-up: Return in about 3 months (around 04/20/2019).  Claretta Fraise, M.D.

## 2019-01-25 ENCOUNTER — Other Ambulatory Visit: Payer: Self-pay

## 2019-01-25 ENCOUNTER — Ambulatory Visit: Payer: Medicare Other | Admitting: Physical Therapy

## 2019-01-25 DIAGNOSIS — G8929 Other chronic pain: Secondary | ICD-10-CM

## 2019-01-25 DIAGNOSIS — M5442 Lumbago with sciatica, left side: Secondary | ICD-10-CM

## 2019-01-25 DIAGNOSIS — R293 Abnormal posture: Secondary | ICD-10-CM | POA: Diagnosis not present

## 2019-01-25 DIAGNOSIS — M5441 Lumbago with sciatica, right side: Secondary | ICD-10-CM

## 2019-01-25 DIAGNOSIS — M6281 Muscle weakness (generalized): Secondary | ICD-10-CM | POA: Diagnosis not present
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
## 2019-02-01 ENCOUNTER — Other Ambulatory Visit: Payer: Self-pay

## 2019-02-01 ENCOUNTER — Ambulatory Visit: Payer: Medicare Other | Attending: Neurosurgery | Admitting: *Deleted

## 2019-02-01 DIAGNOSIS — M6281 Muscle weakness (generalized): Secondary | ICD-10-CM | POA: Insufficient documentation

## 2019-02-01 DIAGNOSIS — M5441 Lumbago with sciatica, right side: Secondary | ICD-10-CM | POA: Insufficient documentation

## 2019-02-01 DIAGNOSIS — G8929 Other chronic pain: Secondary | ICD-10-CM | POA: Diagnosis not present

## 2019-02-01 DIAGNOSIS — R293 Abnormal posture: Secondary | ICD-10-CM | POA: Diagnosis not present

## 2019-02-01 DIAGNOSIS — M5442 Lumbago with sciatica, left side: Secondary | ICD-10-CM | POA: Diagnosis not present
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
## 2019-02-10 ENCOUNTER — Ambulatory Visit: Payer: Medicare Other | Admitting: Physical Therapy

## 2019-02-15 ENCOUNTER — Ambulatory Visit: Payer: Medicare Other | Admitting: Physical Therapy

## 2019-02-15 DIAGNOSIS — M545 Low back pain: Secondary | ICD-10-CM | POA: Diagnosis not present

## 2019-03-08 DIAGNOSIS — I10 Essential (primary) hypertension: Secondary | ICD-10-CM | POA: Diagnosis not present

## 2019-03-08 DIAGNOSIS — M961 Postlaminectomy syndrome, not elsewhere classified: Secondary | ICD-10-CM | POA: Diagnosis not present

## 2019-03-08 DIAGNOSIS — Z6826 Body mass index (BMI) 26.0-26.9, adult: Secondary | ICD-10-CM | POA: Diagnosis not present

## 2019-04-20 ENCOUNTER — Ambulatory Visit: Payer: Medicare Other | Admitting: Family Medicine

## 2019-05-12 DIAGNOSIS — M5432 Sciatica, left side: Secondary | ICD-10-CM | POA: Diagnosis not present

## 2019-05-31 DIAGNOSIS — Z23 Encounter for immunization: Secondary | ICD-10-CM | POA: Diagnosis not present

## 2019-06-07 DIAGNOSIS — M5440 Lumbago with sciatica, unspecified side: Secondary | ICD-10-CM | POA: Diagnosis not present

## 2019-06-15 ENCOUNTER — Other Ambulatory Visit: Payer: Self-pay

## 2019-06-15 ENCOUNTER — Encounter: Payer: Self-pay | Admitting: Family Medicine

## 2019-06-15 ENCOUNTER — Ambulatory Visit (INDEPENDENT_AMBULATORY_CARE_PROVIDER_SITE_OTHER): Payer: Medicare Other | Admitting: Family Medicine

## 2019-06-15 VITALS — BP 158/74 | HR 86 | Temp 98.6°F | Ht 62.0 in | Wt 140.4 lb

## 2019-06-15 DIAGNOSIS — E785 Hyperlipidemia, unspecified: Secondary | ICD-10-CM

## 2019-06-15 DIAGNOSIS — E559 Vitamin D deficiency, unspecified: Secondary | ICD-10-CM

## 2019-06-15 DIAGNOSIS — I10 Essential (primary) hypertension: Secondary | ICD-10-CM | POA: Diagnosis not present

## 2019-06-15 DIAGNOSIS — M8580 Other specified disorders of bone density and structure, unspecified site: Secondary | ICD-10-CM

## 2019-06-15 DIAGNOSIS — M1710 Unilateral primary osteoarthritis, unspecified knee: Secondary | ICD-10-CM | POA: Diagnosis not present

## 2019-06-15 MED ORDER — TRIAMTERENE-HCTZ 37.5-25 MG PO TABS: 1.0000 | ORAL_TABLET | Freq: Every day | ORAL | 3 refills | Status: DC

## 2019-06-15 MED ORDER — CELECOXIB 200 MG PO CAPS: 200.0000 mg | ORAL_CAPSULE | Freq: Two times a day (BID) | ORAL | 1 refills | Status: DC

## 2019-06-15 MED ORDER — RALOXIFENE HCL 60 MG PO TABS: 60.0000 mg | ORAL_TABLET | Freq: Every day | ORAL | 1 refills | Status: DC

## 2019-06-15 MED ORDER — AMLODIPINE BESYLATE 10 MG PO TABS: 10.0000 mg | ORAL_TABLET | Freq: Every day | ORAL | 1 refills | Status: DC

## 2019-06-15 MED ORDER — ATORVASTATIN CALCIUM 40 MG PO TABS: 40.0000 mg | ORAL_TABLET | Freq: Every day | ORAL | 1 refills | Status: DC

## 2019-06-15 NOTE — Progress Notes (Signed)
Subjective:  Patient ID: Anita Perez, female    DOB: May 05, 1935  Age: 84 y.o. MRN: [id number]  CC: Hypertension (check up ) and Knee Pain (right- x 1 month )   HPI Anita Perez presents for  follow-up of hypertension. Patient has no history of headache chest pain or shortness of breath or recent cough. Patient also denies symptoms of TIA such as focal numbness or weakness. Patient denies side effects from medication. States taking it regularly.  Patient checks her blood pressure at home and it is in normal range below 135/85.  Patient in for follow-up of elevated cholesterol. Doing well without complaints on current medication. Denies side effects of statin including myalgia and arthralgia and nausea. Also in today for liver function testing. Currently no chest pain, shortness of breath or other cardiovascular related symptoms noted.  Patient has arthritis for which she takes Celebrex.  Primarily lately it has been affecting the right knee.  The Celebrex does seem to give her good relief.  Patient is also treated for osteopenia.  She is tolerating her medicine well continues to take it regularly.  [name] has a past medical history of Arthritis, Colon polyps, DDD (degenerative disc disease), lumbar, Diverticulitis (1990s), GERD (gastroesophageal reflux disease), Hiatal hernia, Hyperlipidemia, Hypertension, Osteopenia, Rectal carcinoma ([place]), and Small bowel obstruction ([place]) (04/25/2016).   She has a past surgical history that includes intestinal  tear; Appendectomy; Cosmetic surgery (Left); Lumbar laminectomy/decompression microdiscectomy (Left, 06/14/2013); Knee arthroscopy (Right); Tumor removal (Left); Back surgery; Breast cyst excision (Left); Colostomy (1990s); Colostomy takedown (1990s); Colonoscopy w/ biopsies and polypectomy; and Colon surgery.   Her family history includes Cancer in her daughter, sister, and sister; Early death in her brother; Hypertension in her father and  mother; Kidney disease in her father and mother.She reports that she quit smoking about 36 years ago. Her smoking use included cigarettes. She has a 2.88 pack-year smoking history. She quit smokeless tobacco use about 73 years ago.  Her smokeless tobacco use included snuff. She reports previous alcohol use. She reports that she does not use drugs.  Current Outpatient Medications on File Prior to Visit  Medication Sig Dispense Refill  . aspirin 81 MG tablet Take 81 mg by mouth daily.    . Calcium Carbonate-Vitamin D (CALCIUM + D PO) Take 600 mg by mouth daily.    . Cholecalciferol (VITAMIN D) 2000 units CAPS Take 2,000 Units by mouth daily.    . Multiple Vitamin (MULTIVITAMIN) tablet Take 1 tablet by mouth daily.     No current facility-administered medications on file prior to visit.    ROS Review of Systems  Constitutional: Negative.   HENT: Negative for congestion.   Eyes: Negative for visual disturbance.  Respiratory: Negative for shortness of breath.   Cardiovascular: Negative for chest pain.  Gastrointestinal: Negative for abdominal pain, constipation, diarrhea, nausea and vomiting.  Genitourinary: Negative for difficulty urinating.  Musculoskeletal: Positive for arthralgias. Negative for myalgias.  Neurological: Negative for headaches.  Psychiatric/Behavioral: Negative for sleep disturbance.    Objective:  BP (!) 158/74   Pulse 86   Temp 98.6 F (37 C) (Temporal)   Ht '5\' 2"'  (1.575 m)   Wt 140 lb 6.4 oz (63.7 kg)   SpO2 96%   BMI 25.68 kg/m   BP Readings from Last 3 Encounters:  06/15/19 (!) 158/74  01/18/19 137/73  12/07/18 (!) 160/92    Wt Readings from Last 3 Encounters:  06/15/19 140 lb 6.4 oz (63.7 kg)  01/18/19 147 lb (66.7 kg)  12/07/18 147 lb (66.7 kg)     Physical Exam Constitutional:      General: She is not in acute distress.    Appearance: She is well-developed.  HENT:     Head: Normocephalic and atraumatic.  Eyes:     Conjunctiva/sclera:  Conjunctivae normal.     Pupils: Pupils are equal, round, and reactive to light.  Neck:     Thyroid: No thyromegaly.  Cardiovascular:     Rate and Rhythm: Normal rate and regular rhythm.     Heart sounds: Normal heart sounds. No murmur.  Pulmonary:     Effort: Pulmonary effort is normal. No respiratory distress.     Breath sounds: Normal breath sounds. No wheezing or rales.  Abdominal:     General: Bowel sounds are normal. There is no distension.     Palpations: Abdomen is soft.     Tenderness: There is no abdominal tenderness.  Musculoskeletal:        General: Normal range of motion.     Cervical back: Normal range of motion and neck supple.  Lymphadenopathy:     Cervical: No cervical adenopathy.  Skin:    General: Skin is warm and dry.  Neurological:     Mental Status: She is alert and oriented to person, place, and time.  Psychiatric:        Behavior: Behavior normal.        Thought Content: Thought content normal.        Judgment: Judgment normal.       Assessment & Plan:   Anita Perez was seen today for hypertension and knee pain.  Diagnoses and all orders for this visit:  Essential hypertension, benign -     amLODipine (NORVASC) 10 MG tablet; Take 1 tablet (10 mg total) by mouth daily. -     triamterene-hydrochlorothiazide (MAXZIDE-25) 37.5-25 MG tablet; Take 1 tablet by mouth daily. -     CBC with Differential/Platelet -     CMP14+EGFR  Hyperlipidemia with target LDL less than 100 -     atorvastatin (LIPITOR) 40 MG tablet; Take 1 tablet (40 mg total) by mouth daily. -     CMP14+EGFR -     Lipid panel  Primary osteoarthritis of knee, unspecified laterality -     celecoxib (CELEBREX) 200 MG capsule; Take 1 capsule (200 mg total) by mouth 2 (two) times daily. -     CBC with Differential/Platelet -     CMP14+EGFR  Osteopenia determined by x-ray -     raloxifene (EVISTA) 60 MG tablet; Take 1 tablet (60 mg total) by mouth daily. For bone health and breast cancer  prevention -     CBC with Differential/Platelet -     CMP14+EGFR -     VITAMIN D 25 Hydroxy (Vit-D Deficiency, Fractures)  Vitamin D deficiency -     VITAMIN D 25 Hydroxy (Vit-D Deficiency, Fractures)   Allergies as of 06/15/2019      Reactions   Diclofenac    SOB, nervous   Pepto-bismol [bismuth] Other (See Comments)   Turned mouth black inside   Ropinirole Other (See Comments)   Jittery      Medication List       Accurate as of June 15, 2019 11:14 PM. If you have any questions, ask your nurse or doctor.        amLODipine 10 MG tablet Commonly known as: NORVASC Take 1 tablet (10 mg total) by mouth daily.  aspirin 81 MG tablet Take 81 mg by mouth daily.   atorvastatin 40 MG tablet Commonly known as: LIPITOR Take 1 tablet (40 mg total) by mouth daily.   CALCIUM + D PO Take 600 mg by mouth daily.   celecoxib 200 MG capsule Commonly known as: CeleBREX Take 1 capsule (200 mg total) by mouth 2 (two) times daily.   multivitamin tablet Take 1 tablet by mouth daily.   raloxifene 60 MG tablet Commonly known as: Evista Take 1 tablet (60 mg total) by mouth daily. For bone health and breast cancer prevention   triamterene-hydrochlorothiazide 37.5-25 MG tablet Commonly known as: MAXZIDE-25 Take 1 tablet by mouth daily.   Vitamin D 50 MCG (2000 UT) Caps Take 2,000 Units by mouth daily.       Meds ordered this encounter  Medications  . atorvastatin (LIPITOR) 40 MG tablet    Sig: Take 1 tablet (40 mg total) by mouth daily.    Dispense:  90 tablet    Refill:  1  . celecoxib (CELEBREX) 200 MG capsule    Sig: Take 1 capsule (200 mg total) by mouth 2 (two) times daily.    Dispense:  180 capsule    Refill:  1  . amLODipine (NORVASC) 10 MG tablet    Sig: Take 1 tablet (10 mg total) by mouth daily.    Dispense:  90 tablet    Refill:  1  . triamterene-hydrochlorothiazide (MAXZIDE-25) 37.5-25 MG tablet    Sig: Take 1 tablet by mouth daily.    Dispense:  90 tablet     Refill:  3  . raloxifene (EVISTA) 60 MG tablet    Sig: Take 1 tablet (60 mg total) by mouth daily. For bone health and breast cancer prevention    Dispense:  90 tablet    Refill:  1    Patient with multiple concerns all stable this includes hyperlipidemia and hypertension.  She has not had any type of endorgan disease including cerebrovascular or cardiovascular as a result of these.  She also has arthritis which does slow her down some due to pain.  However she is doing well with regard to this condition as long as she takes her Celebrex.  It does minimize her mobility issues.  With regard to osteopenia she continues to take Evista without side effects.  She is pleased that it also helps reduce her risk of breast cancer.  She realizes she does need to have her annual mammogram though.  She is also due for DEXA.  Follow-up: Return in about 6 months (around 12/16/2019).  Claretta Fraise, M.D.

## 2019-06-16 ENCOUNTER — Other Ambulatory Visit: Payer: Self-pay | Admitting: Family Medicine

## 2019-06-16 ENCOUNTER — Other Ambulatory Visit: Payer: Self-pay

## 2019-06-16 ENCOUNTER — Telehealth: Payer: Self-pay | Admitting: Family Medicine

## 2019-06-16 DIAGNOSIS — E785 Hyperlipidemia, unspecified: Secondary | ICD-10-CM

## 2019-06-16 LAB — CBC WITH DIFFERENTIAL/PLATELET
Basophils Absolute: 0.1 10*3/uL (ref 0.0–0.2)
Basos: 1 %
EOS (ABSOLUTE): 0.1 10*3/uL (ref 0.0–0.4)
Eos: 2 %
Hematocrit: 35.7 % (ref 34.0–46.6)
Hemoglobin: 11.3 g/dL (ref 11.1–15.9)
Immature Grans (Abs): 0 10*3/uL (ref 0.0–0.1)
Immature Granulocytes: 0 %
Lymphocytes Absolute: 1.9 10*3/uL (ref 0.7–3.1)
Lymphs: 28 %
MCH: 27.4 pg (ref 26.6–33.0)
MCHC: 31.7 g/dL (ref 31.5–35.7)
MCV: 86 fL (ref 79–97)
Monocytes Absolute: 0.6 10*3/uL (ref 0.1–0.9)
Monocytes: 9 %
Neutrophils Absolute: 4 10*3/uL (ref 1.4–7.0)
Neutrophils: 60 %
Platelets: 277 10*3/uL (ref 150–450)
RBC: 4.13 x10E6/uL (ref 3.77–5.28)
RDW: 13.5 % (ref 11.7–15.4)
WBC: 6.7 10*3/uL (ref 3.4–10.8)

## 2019-06-16 LAB — LIPID PANEL
Chol/HDL Ratio: 3.6 ratio (ref 0.0–4.4)
Cholesterol, Total: 201 mg/dL — ABNORMAL HIGH (ref 100–199)
HDL: 56 mg/dL (ref >39)
LDL Chol Calc (NIH): 125 mg/dL — ABNORMAL HIGH (ref 0–99)
Triglycerides: 114 mg/dL (ref 0–149)
VLDL Cholesterol Cal: 20 mg/dL (ref 5–40)

## 2019-06-16 LAB — CMP14+EGFR
ALT: 18 IU/L (ref 0–32)
AST: 21 IU/L (ref 0–40)
Albumin/Globulin Ratio: 1.6 (ref 1.2–2.2)
Albumin: 3.9 g/dL (ref 3.6–4.6)
Alkaline Phosphatase: 56 IU/L (ref 39–117)
BUN/Creatinine Ratio: 17 (ref 12–28)
BUN: 17 mg/dL (ref 8–27)
Bilirubin Total: 0.3 mg/dL (ref 0.0–1.2)
CO2: 26 mmol/L (ref 20–29)
Calcium: 9.1 mg/dL (ref 8.7–10.3)
Chloride: 103 mmol/L (ref 96–106)
Creatinine, Ser: 0.98 mg/dL (ref 0.57–1.00)
GFR calc Af Amer: 63 mL/min/{1.73_m2} (ref >59)
GFR calc non Af Amer: 54 mL/min/{1.73_m2} — ABNORMAL LOW (ref >59)
Globulin, Total: 2.4 g/dL (ref 1.5–4.5)
Glucose: 88 mg/dL (ref 65–99)
Potassium: 3.8 mmol/L (ref 3.5–5.2)
Sodium: 143 mmol/L (ref 134–144)
Total Protein: 6.3 g/dL (ref 6.0–8.5)

## 2019-06-16 LAB — VITAMIN D 25 HYDROXY (VIT D DEFICIENCY, FRACTURES): Vit D, 25-Hydroxy: 45.4 ng/mL (ref 30.0–100.0)

## 2019-06-16 MED ORDER — ATORVASTATIN CALCIUM 80 MG PO TABS: 80.0000 mg | ORAL_TABLET | Freq: Every day | ORAL | 1 refills | Status: DC

## 2019-06-16 NOTE — Chronic Care Management (AMB) (Signed)
  Chronic Care Management   Outreach Note  06/16/2019 Name: Anita Perez MRN: [id number] DOB: September 03, 1935  Anita Perez is a 84 y.o. year old female who is a primary care patient of Stacks, Cletus Gash, MD. I reached out to Anita Perez by phone today in response to a referral sent by Ms. Anita Perez's health plan.     An unsuccessful telephone outreach was attempted today. The patient was referred to the case management team for assistance with care management and care coordination.   Follow Up Plan: A HIPPA compliant phone message was left for the patient providing contact information and requesting a return call.The care management team will reach out to the patient again over the next 7 days. If patient returns call to provider office, please advise to call [facility] at [phone].  [place], [place] [address] Direct Dial: [phone] [email or web address]

## 2019-06-20 NOTE — Chronic Care Management (AMB) (Signed)
  Chronic Care Management   Note  06/20/2019 Name: Anita Perez MRN: [id number] DOB: Jan 03, 1936  Anita Perez is a 84 y.o. year old female who is a primary care patient of Stacks, Cletus Gash, MD. I reached out to [name] by phone today in response to a referral sent by Anita Perez health plan.     Anita Perez was given information about Chronic Care Management services today including:  1. CCM service includes personalized support from designated clinical staff supervised by her physician, including individualized plan of care and coordination with other care providers 2. 24/7 contact phone numbers for assistance for urgent and routine care needs. 3. Service will only be billed when office clinical staff spend 20 minutes or more in a month to coordinate care. 4. Only one practitioner may furnish and bill the service in a calendar month. 5. The patient may stop CCM services at any time (effective at the end of the month) by phone call to the office staff. 6. The patient will be responsible for cost sharing (co-pay) of up to 20% of the service fee (after annual deductible is met).  Patient agreed to services and verbal consent obtained.   Follow up plan: Telephone appointment with care management team member scheduled for: 11/25/2019  Anita Perez Management  [place], [place] [address] Direct Dial: [email or web address] Website: [email or web address]

## 2019-06-28 DIAGNOSIS — Z23 Encounter for immunization: Secondary | ICD-10-CM | POA: Diagnosis not present

## 2019-07-05 ENCOUNTER — Telehealth: Payer: Self-pay | Admitting: Family Medicine

## 2019-07-05 NOTE — Telephone Encounter (Signed)
Patient aware of what cholesterol medication she should be taking.

## 2019-08-15 DIAGNOSIS — M5440 Lumbago with sciatica, unspecified side: Secondary | ICD-10-CM | POA: Diagnosis not present

## 2019-08-15 DIAGNOSIS — M545 Low back pain: Secondary | ICD-10-CM | POA: Diagnosis not present

## 2019-09-05 ENCOUNTER — Encounter: Payer: Self-pay | Admitting: *Deleted

## 2019-10-24 ENCOUNTER — Telehealth: Payer: Self-pay | Admitting: Family Medicine

## 2019-10-24 DIAGNOSIS — I10 Essential (primary) hypertension: Secondary | ICD-10-CM

## 2019-10-24 MED ORDER — AMLODIPINE BESYLATE 10 MG PO TABS: 10.0000 mg | ORAL_TABLET | Freq: Every day | ORAL | 0 refills | Status: DC

## 2019-10-24 NOTE — Telephone Encounter (Signed)
  Prescription Request  10/24/2019  What is the name of the medication or equipment? amLODipine (NORVASC) 10 MG tablet    Have you contacted your pharmacy to request a refill? (if applicable) Yes  Which pharmacy would you like this sent to? [facility]   Patient notified that their request is being sent to the clinical staff for review and that they should receive a response within 2 business days.

## 2019-10-24 NOTE — Telephone Encounter (Signed)
Left message that rx was sent to pharmacy and to call back with any questions or concerns.

## 2019-11-23 ENCOUNTER — Other Ambulatory Visit: Payer: Self-pay

## 2019-11-23 ENCOUNTER — Ambulatory Visit (INDEPENDENT_AMBULATORY_CARE_PROVIDER_SITE_OTHER): Payer: Medicare HMO | Admitting: *Deleted

## 2019-11-23 DIAGNOSIS — Z0000 Encounter for general adult medical examination without abnormal findings: Secondary | ICD-10-CM | POA: Diagnosis not present

## 2019-11-23 NOTE — Patient Instructions (Signed)
Preventive Care 82 Years and Older, Female Preventive care refers to lifestyle choices and visits with your health care provider that can promote health and wellness. This includes:  A yearly physical exam. This is also called an annual well check.  Regular dental and eye exams.  Immunizations.  Screening for certain conditions.  Healthy lifestyle choices, such as diet and exercise. What can I expect for my preventive care visit? Physical exam Your health care provider will check:  Height and weight. These may be used to calculate body mass index (BMI), which is a measurement that tells if you are at a healthy weight.  Heart rate and blood pressure.  Your skin for abnormal spots. Counseling Your health care provider may ask you questions about:  Alcohol, tobacco, and drug use.  Emotional well-being.  Home and relationship well-being.  Sexual activity.  Eating habits.  History of falls.  Memory and ability to understand (cognition).  Work and work Statistician.  Pregnancy and menstrual history. What immunizations do I need?  Influenza (flu) vaccine  This is recommended every year. Tetanus, diphtheria, and pertussis (Tdap) vaccine  You may need a Td booster every 10 years. Varicella (chickenpox) vaccine  You may need this vaccine if you have not already been vaccinated. Zoster (shingles) vaccine  You may need this after age 82. Pneumococcal conjugate (PCV13) vaccine  One dose is recommended after age 82. Pneumococcal polysaccharide (PPSV23) vaccine  One dose is recommended after age 82. Measles, mumps, and rubella (MMR) vaccine  You may need at least one dose of MMR if you were born in 1957 or later. You may also need a second dose. Meningococcal conjugate (MenACWY) vaccine  You may need this if you have certain conditions. Hepatitis A vaccine  You may need this if you have certain conditions or if you travel or work in places where you may be exposed  to hepatitis A. Hepatitis B vaccine  You may need this if you have certain conditions or if you travel or work in places where you may be exposed to hepatitis B. Haemophilus influenzae type b (Hib) vaccine  You may need this if you have certain conditions. You may receive vaccines as individual doses or as more than one vaccine together in one shot (combination vaccines). Talk with your health care provider about the risks and benefits of combination vaccines. What tests do I need? Blood tests  Lipid and cholesterol levels. These may be checked every 5 years, or more frequently depending on your overall health.  Hepatitis C test.  Hepatitis B test. Screening  Lung cancer screening. You may have this screening every year starting at age 82 if you have a 30-pack-year history of smoking and currently smoke or have quit within the past 15 years.  Colorectal cancer screening. All adults should have this screening starting at age 82 and continuing until age 15. Your health care provider may recommend screening at age 23 if you are at increased risk. You will have tests every 1-10 years, depending on your results and the type of screening test.  Diabetes screening. This is done by checking your blood sugar (glucose) after you have not eaten for a while (fasting). You may have this done every 1-3 years.  Mammogram. This may be done every 1-2 years. Talk with your health care provider about how often you should have regular mammograms.  BRCA-related cancer screening. This may be done if you have a family history of breast, ovarian, tubal, or peritoneal cancers.  Other tests  Sexually transmitted disease (STD) testing.  Bone density scan. This is done to screen for osteoporosis. You may have this done starting at age 82. Follow these instructions at home: Eating and drinking  Eat a diet that includes fresh fruits and vegetables, whole grains, lean protein, and low-fat dairy products. Limit  your intake of foods with high amounts of sugar, saturated fats, and salt.  Take vitamin and mineral supplements as recommended by your health care provider.  Do not drink alcohol if your health care provider tells you not to drink.  If you drink alcohol: ? Limit how much you have to 0-1 drink a day. ? Be aware of how much alcohol is in your drink. In the [place] one drink equals one 12 oz bottle of beer (355 mL), one 5 oz glass of wine (148 mL), or one 1 oz glass of hard liquor (44 mL). Lifestyle  Take daily care of your teeth and gums.  Stay active. Exercise for at least 30 minutes on 5 or more days each week.  Do not use any products that contain nicotine or tobacco, such as cigarettes, e-cigarettes, and chewing tobacco. If you need help quitting, ask your health care provider.  If you are sexually active, practice safe sex. Use a condom or other form of protection in order to prevent STIs (sexually transmitted infections).  Talk with your health care provider about taking a low-dose aspirin or statin. What's next?  Go to your health care provider once a year for a well check visit.  Ask your health care provider how often you should have your eyes and teeth checked.  Stay up to date on all vaccines. This information is not intended to replace advice given to you by your health care provider. Make sure you discuss any questions you have with your health care provider. Document Revised: 03/11/2018 Document Reviewed: 03/11/2018 Elsevier Patient Education  2020 [employer].
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
## 2019-11-25 ENCOUNTER — Ambulatory Visit: Payer: Medicare HMO | Admitting: *Deleted

## 2019-11-25 DIAGNOSIS — I10 Essential (primary) hypertension: Secondary | ICD-10-CM

## 2019-11-25 DIAGNOSIS — E785 Hyperlipidemia, unspecified: Secondary | ICD-10-CM

## 2019-11-25 NOTE — Patient Instructions (Signed)
Visit Information  Anita Perez was given information about Chronic Care Management services today including:  1. CCM service includes personalized support from designated clinical staff supervised by her physician, including individualized plan of care and coordination with other care providers 2. 24/7 contact phone numbers for assistance for urgent and routine care needs. 3. Service will only be billed when office clinical staff spend 20 minutes or more in a month to coordinate care. 4. Only one practitioner may furnish and bill the service in a calendar month. 5. The patient may stop CCM services at any time (effective at the end of the month) by phone call to the office staff. 6. The patient will be responsible for cost sharing (co-pay) of up to 20% of the service fee (after annual deductible is met).  Patient agreed to services and verbal consent obtained.   Patient verbalizes understanding of instructions provided today.   The care management team will reach out to the patient again over the next 90 days.   Patient will reach out to [facility] as needed.  Chong Sicilian, BSN, RN-BC Embedded Chronic Care Manager [facility] Direct Dial: [phone]

## 2019-11-25 NOTE — Chronic Care Management (AMB) (Signed)
  Chronic Care Management   Initial Visit Note  11/25/2019 Name: Anita Perez MRN: [id number] DOB: 1935-08-09  Referred by: Claretta Fraise, MD Reason for referral : Chronic Care Management (Initial Visit)   Anita Perez is a 84 y.o. year old female who is a primary care patient of Stacks, Cletus Gash, MD. The CCM team was consulted for assistance with chronic disease management and care coordination needs related to HTN, HLD and arthritis  Review of patient status, including review of consultants reports, relevant laboratory and other test results, and collaboration with appropriate care team members and the patient's provider was performed as part of comprehensive patient evaluation and provision of chronic care management services.    I spoke with Anita Perez briefly today regarding management of her chronic medical conditions. She did not have time to complete a full initial visit. She did complete an Annual Wellness Visit 2 days ago. I reviewed SDOH and some general assessment questions and provided information on the embedded CCM program. Per patient, she does not have any resource or CCM needs and feels that her medical conditions are well controlled at this time. She would like a call back in a few months to reassess. Initial visit will be completed at that time. Patient requested RNCM direct contact number and will reach out with any CCM needs prior to next visit.   SDOH (Social Determinants of Health) assessments performed: Yes See Care Plan activities for detailed interventions related to SDOH      Plan:  The care management team will reach out to the patient again over the next 90 days.  Patient will reach out to [facility] as needed  Chong Sicilian, BSN, RN-BC [place] [facility] [place] Management Direct Dial: [phone]

## 2019-12-14 ENCOUNTER — Encounter: Payer: Self-pay | Admitting: Family Medicine

## 2019-12-14 ENCOUNTER — Ambulatory Visit (INDEPENDENT_AMBULATORY_CARE_PROVIDER_SITE_OTHER): Payer: Medicare HMO | Admitting: Family Medicine

## 2019-12-14 ENCOUNTER — Other Ambulatory Visit: Payer: Self-pay

## 2019-12-14 VITALS — BP 130/82 | HR 84 | Temp 97.8°F | Resp 20 | Ht 62.0 in | Wt 138.0 lb

## 2019-12-14 DIAGNOSIS — G629 Polyneuropathy, unspecified: Secondary | ICD-10-CM

## 2019-12-14 DIAGNOSIS — I10 Essential (primary) hypertension: Secondary | ICD-10-CM | POA: Diagnosis not present

## 2019-12-14 DIAGNOSIS — Z23 Encounter for immunization: Secondary | ICD-10-CM | POA: Diagnosis not present

## 2019-12-14 DIAGNOSIS — M8589 Other specified disorders of bone density and structure, multiple sites: Secondary | ICD-10-CM

## 2019-12-14 DIAGNOSIS — E559 Vitamin D deficiency, unspecified: Secondary | ICD-10-CM | POA: Diagnosis not present

## 2019-12-14 DIAGNOSIS — M5126 Other intervertebral disc displacement, lumbar region: Secondary | ICD-10-CM

## 2019-12-14 DIAGNOSIS — E785 Hyperlipidemia, unspecified: Secondary | ICD-10-CM

## 2019-12-14 MED ORDER — PRAMIPEXOLE DIHYDROCHLORIDE 0.125 MG PO TABS: 0.1250 mg | ORAL_TABLET | Freq: Every day | ORAL | 1 refills | Status: DC

## 2019-12-14 NOTE — Progress Notes (Addendum)
Subjective:  Patient ID: Anita Perez, female    DOB: Sep 24, 1935  Age: 84 y.o. MRN: [id number]  CC: Medical Management of Chronic Issues   HPI Anita Perez presents for  follow-up of hypertension. Patient has no history of headache chest pain or shortness of breath or recent cough. Patient also denies symptoms of TIA such as focal numbness or weakness. Patient denies side effects from medication. States taking it regularly.   in for follow-up of elevated cholesterol. Doing well without complaints on current medication. Denies side effects of statin including myalgia and arthralgia and nausea. Currently no chest pain, shortness of breath or other cardiovascular related symptoms noted.  Pain also in the toes.  They cramp during the night and turn under.  Anita Perez gets some relief by manually relaxing them and straightening the when they curl under.  Anita Perez gets some relief using Celebrex.  Anita Perez also has arthritis in the hands that is relieved by Celebrex as well they do stiffen up on her overnight.  Anita Perez continues to use the raloxifene for her osteopenia and takes vitamin D as well. [name] has a past medical history of Arthritis, Colon polyps, DDD (degenerative disc disease), lumbar, Diverticulitis (1990s), GERD (gastroesophageal reflux disease), Hiatal hernia, Hyperlipidemia, Hypertension, Osteopenia, Rectal carcinoma ([place]), SBO (small bowel obstruction) ([place]) (04/25/2016), and Small bowel obstruction ([place]) (04/25/2016).   Anita Perez has a past surgical history that includes intestinal  tear; Appendectomy; Cosmetic surgery (Left); Lumbar laminectomy/decompression microdiscectomy (Left, 06/14/2013); Knee arthroscopy (Right); Tumor removal (Left); Back surgery; Breast cyst excision (Left); Colostomy (1990s); Colostomy takedown (1990s); Colonoscopy w/ biopsies and polypectomy; and Colon surgery.   Her family history includes Cancer in her daughter, sister, and sister; Early death in her brother;  Hypertension in her father and mother; Kidney disease in her father and mother.Anita Perez reports that Anita Perez quit smoking about 37 years ago. Her smoking use included cigarettes. Anita Perez has a 2.88 pack-year smoking history. Anita Perez quit smokeless tobacco use about 73 years ago.  Her smokeless tobacco use included snuff. Anita Perez reports previous alcohol use. Anita Perez reports that Anita Perez does not use drugs.  Current Outpatient Medications on File Prior to Visit  Medication Sig Dispense Refill  . amLODipine (NORVASC) 10 MG tablet Take 1 tablet (10 mg total) by mouth daily. 90 tablet 0  . aspirin 81 MG tablet Take 81 mg by mouth daily.    Marland Kitchen atorvastatin (LIPITOR) 80 MG tablet Take 1 tablet (80 mg total) by mouth daily. 90 tablet 1  . Calcium Carbonate-Vitamin D (CALCIUM + D PO) Take 600 mg by mouth daily.    . celecoxib (CELEBREX) 200 MG capsule Take 1 capsule (200 mg total) by mouth 2 (two) times daily. 180 capsule 1  . Cholecalciferol (VITAMIN D) 2000 units CAPS Take 2,000 Units by mouth daily.    . Multiple Vitamin (MULTIVITAMIN) tablet Take 1 tablet by mouth daily.    . raloxifene (EVISTA) 60 MG tablet Take 1 tablet (60 mg total) by mouth daily. For bone health and breast cancer prevention 90 tablet 1  . triamterene-hydrochlorothiazide (MAXZIDE-25) 37.5-25 MG tablet Take 1 tablet by mouth daily. 90 tablet 3   No current facility-administered medications on file prior to visit.    ROS Review of Systems  Constitutional: Negative.   HENT: Negative.   Eyes: Negative for visual disturbance.  Respiratory: Negative for shortness of breath.   Cardiovascular: Negative for chest pain.  Gastrointestinal: Negative for abdominal pain.  Musculoskeletal: Positive for myalgias. Negative for arthralgias.  Objective:  BP 130/82   Pulse 84   Temp 97.8 F (36.6 C) (Temporal)   Resp 20   Ht 5' 2" (1.575 m)   Wt 138 lb (62.6 kg)   SpO2 98%   BMI 25.24 kg/m   BP Readings from Last 3 Encounters:  12/14/19 130/82  06/15/19  (!) 158/74  01/18/19 137/73    Wt Readings from Last 3 Encounters:  12/14/19 138 lb (62.6 kg)  06/15/19 140 lb 6.4 oz (63.7 kg)  01/18/19 147 lb (66.7 kg)     Physical Exam Constitutional:      General: Anita Perez is not in acute distress.    Appearance: Anita Perez is well-developed.  Cardiovascular:     Rate and Rhythm: Normal rate and regular rhythm.  Pulmonary:     Breath sounds: Normal breath sounds.  Skin:    General: Skin is warm and dry.  Neurological:     Mental Status: Anita Perez is alert and oriented to person, place, and time.       Assessment & Plan:   Anita Perez was seen today for medical management of chronic issues.  Diagnoses and all orders for this visit:  Essential hypertension, benign -     CBC with Differential/Platelet -     CMP14+EGFR  Lumbar disc herniation -     CBC with Differential/Platelet -     CMP14+EGFR  Hyperlipidemia with target LDL less than 100 -     CBC with Differential/Platelet -     CMP14+EGFR -     Lipid panel  Neuropathy (HCC) -     CBC with Differential/Platelet -     CMP14+EGFR  Osteopenia of multiple sites -     CBC with Differential/Platelet -     CMP14+EGFR  Vitamin D deficiency -     CBC with Differential/Platelet -     CMP14+EGFR -     VITAMIN D 25 Hydroxy (Vit-D Deficiency, Fractures)  Need for immunization against influenza -     Flu Vaccine QUAD High Dose(Fluad)  Other orders -     pramipexole (MIRAPEX) 0.125 MG tablet; Take 1 tablet (0.125 mg total) by mouth at bedtime. For leg and foot cramping   Allergies as of 12/14/2019      Reactions   Diclofenac    SOB, nervous   Pepto-bismol [bismuth] Other (See Comments)   Turned mouth black inside   Ropinirole Other (See Comments)   Jittery      Medication List       Accurate as of December 14, 2019 11:59 PM. If you have any questions, ask your nurse or doctor.        amLODipine 10 MG tablet Commonly known as: NORVASC Take 1 tablet (10 mg total) by mouth daily.    aspirin 81 MG tablet Take 81 mg by mouth daily.   atorvastatin 80 MG tablet Commonly known as: LIPITOR Take 1 tablet (80 mg total) by mouth daily.   CALCIUM + D PO Take 600 mg by mouth daily.   celecoxib 200 MG capsule Commonly known as: CeleBREX Take 1 capsule (200 mg total) by mouth 2 (two) times daily.   multivitamin tablet Take 1 tablet by mouth daily.   pramipexole 0.125 MG tablet Commonly known as: MIRAPEX Take 1 tablet (0.125 mg total) by mouth at bedtime. For leg and foot cramping Started by: Claretta Fraise, MD   raloxifene 60 MG tablet Commonly known as: Evista Take 1 tablet (60 mg total) by mouth daily. For bone health  and breast cancer prevention   triamterene-hydrochlorothiazide 37.5-25 MG tablet Commonly known as: MAXZIDE-25 Take 1 tablet by mouth daily.   Vitamin D 50 MCG (2000 UT) Caps Take 2,000 Units by mouth daily.       Meds ordered this encounter  Medications  . pramipexole (MIRAPEX) 0.125 MG tablet    Sig: Take 1 tablet (0.125 mg total) by mouth at bedtime. For leg and foot cramping    Dispense:  90 tablet    Refill:  1      Follow-up: Return in about 6 months (around 06/12/2020).  Claretta Fraise, M.D.

## 2019-12-15 ENCOUNTER — Ambulatory Visit: Payer: Medicare Other | Admitting: Family Medicine

## 2019-12-15 LAB — CBC WITH DIFFERENTIAL/PLATELET
Basophils Absolute: 0.1 10*3/uL (ref 0.0–0.2)
Basos: 1 %
EOS (ABSOLUTE): 0.1 10*3/uL (ref 0.0–0.4)
Eos: 1 %
Hematocrit: 34.8 % (ref 34.0–46.6)
Hemoglobin: 11 g/dL — ABNORMAL LOW (ref 11.1–15.9)
Immature Grans (Abs): 0 10*3/uL (ref 0.0–0.1)
Immature Granulocytes: 0 %
Lymphocytes Absolute: 2 10*3/uL (ref 0.7–3.1)
Lymphs: 26 %
MCH: 27.1 pg (ref 26.6–33.0)
MCHC: 31.6 g/dL (ref 31.5–35.7)
MCV: 86 fL (ref 79–97)
Monocytes Absolute: 0.6 10*3/uL (ref 0.1–0.9)
Monocytes: 8 %
Neutrophils Absolute: 5 10*3/uL (ref 1.4–7.0)
Neutrophils: 64 %
Platelets: 252 10*3/uL (ref 150–450)
RBC: 4.06 x10E6/uL (ref 3.77–5.28)
RDW: 13.4 % (ref 11.7–15.4)
WBC: 7.7 10*3/uL (ref 3.4–10.8)

## 2019-12-15 LAB — CMP14+EGFR
ALT: 10 IU/L (ref 0–32)
AST: 16 IU/L (ref 0–40)
Albumin/Globulin Ratio: 1.6 (ref 1.2–2.2)
Albumin: 4.1 g/dL (ref 3.6–4.6)
Alkaline Phosphatase: 63 IU/L (ref 44–121)
BUN/Creatinine Ratio: 16 (ref 12–28)
BUN: 18 mg/dL (ref 8–27)
Bilirubin Total: 0.5 mg/dL (ref 0.0–1.2)
CO2: 26 mmol/L (ref 20–29)
Calcium: 9.1 mg/dL (ref 8.7–10.3)
Chloride: 104 mmol/L (ref 96–106)
Creatinine, Ser: 1.1 mg/dL — ABNORMAL HIGH (ref 0.57–1.00)
GFR calc Af Amer: 54 mL/min/{1.73_m2} — ABNORMAL LOW (ref >59)
GFR calc non Af Amer: 47 mL/min/{1.73_m2} — ABNORMAL LOW (ref >59)
Globulin, Total: 2.5 g/dL (ref 1.5–4.5)
Glucose: 89 mg/dL (ref 65–99)
Potassium: 4 mmol/L (ref 3.5–5.2)
Sodium: 142 mmol/L (ref 134–144)
Total Protein: 6.6 g/dL (ref 6.0–8.5)

## 2019-12-15 LAB — VITAMIN D 25 HYDROXY (VIT D DEFICIENCY, FRACTURES): Vit D, 25-Hydroxy: 68 ng/mL (ref 30.0–100.0)

## 2019-12-15 LAB — LIPID PANEL
Chol/HDL Ratio: 3.6 ratio (ref 0.0–4.4)
Cholesterol, Total: 189 mg/dL (ref 100–199)
HDL: 52 mg/dL (ref >39)
LDL Chol Calc (NIH): 120 mg/dL — ABNORMAL HIGH (ref 0–99)
Triglycerides: 92 mg/dL (ref 0–149)
VLDL Cholesterol Cal: 17 mg/dL (ref 5–40)

## 2019-12-15 NOTE — Progress Notes (Signed)
Hello Alexi,  Your lab result is normal and/or stable.Some minor variations that are not significant are commonly marked abnormal, but do not represent any medical problem for you.  Best regards, Audyn Dimercurio, M.D.

## 2019-12-17 ENCOUNTER — Encounter: Payer: Self-pay | Admitting: Family Medicine

## 2019-12-19 ENCOUNTER — Other Ambulatory Visit: Payer: Self-pay | Admitting: Family Medicine

## 2019-12-19 DIAGNOSIS — I10 Essential (primary) hypertension: Secondary | ICD-10-CM

## 2020-01-05 ENCOUNTER — Encounter: Payer: Self-pay | Admitting: Nurse Practitioner

## 2020-01-05 ENCOUNTER — Ambulatory Visit (INDEPENDENT_AMBULATORY_CARE_PROVIDER_SITE_OTHER): Payer: Medicare HMO | Admitting: Nurse Practitioner

## 2020-01-05 DIAGNOSIS — M79605 Pain in left leg: Secondary | ICD-10-CM

## 2020-01-05 MED ORDER — PREDNISONE 10 MG (21) PO TBPK: ORAL_TABLET | ORAL | 0 refills | Status: DC

## 2020-01-05 NOTE — Progress Notes (Signed)
   Virtual Visit via telephone Note Due to COVID-19 pandemic this visit was conducted virtually. This visit type was conducted due to national recommendations for restrictions regarding the COVID-19 Pandemic (e.g. social distancing, sheltering in place) in an effort to limit this patient's exposure and mitigate transmission in our community. All issues noted in this document were discussed and addressed.  A physical exam was not performed with this format.  I connected with Anita Perez on 01/05/20 at 11:25 by telephone and verified that I am speaking with the correct person using two identifiers. Anita Perez is currently located at home and no one is currently with her during visit. The provider, Mary-Margaret Hassell Done, FNP is located in their office at time of visit.  I discussed the limitations, risks, security and privacy concerns of performing an evaluation and management service by telephone and the availability of in person appointments. I also discussed with the patient that there may be a patient responsible charge related to this service. The patient expressed understanding and agreed to proceed.   History and Present Illness:   Chief Complaint: Leg Pain   HPI Patient calls for an appointment c/o pain that goes from her toes up to her hip on left side. She says she is sure that it is not going from hip down. Rates pain 10/10 currently. Walking increases pian.laying down makes it better. She denies any swelling of leg. She has had back surgery in the past and has been having leg pain for awhile. She was given mirapex and that has not helped   Review of Systems  Musculoskeletal: Positive for back pain (slight) and myalgias.  All other systems reviewed and are negative.    Observations/Objective: Alert and oriented- answers all questions appropriately moderate distress    Assessment and Plan: Anita Perez in today with chief complaint of Leg Pain   1. Left leg  pain probably coming from her back Moist heat Rest Meds ordered this encounter  Medications  . predniSONE (STERAPRED UNI-PAK 21 TAB) 10 MG (21) TBPK tablet    Sig: As directed x 6 days    Dispense:  21 tablet    Refill:  0    Order Specific Question:   Supervising Provider    Answer:   Caryl Pina A [[id number]]       Follow Up Instructions: prn    I discussed the assessment and treatment plan with the patient. The patient was provided an opportunity to ask questions and all were answered. The patient agreed with the plan and demonstrated an understanding of the instructions.   The patient was advised to call back or seek an in-person evaluation if the symptoms worsen or if the condition fails to improve as anticipated.  The above assessment and management plan was discussed with the patient. The patient verbalized understanding of and has agreed to the management plan. Patient is aware to call the clinic if symptoms persist or worsen. Patient is aware when to return to the clinic for a follow-up visit. Patient educated on when it is appropriate to go to the emergency department.   Time call ended:  11:41  I provided 16 minutes of non-face-to-face time during this encounter.    Mary-Margaret Hassell Done, FNP

## 2020-01-09 ENCOUNTER — Telehealth: Payer: Self-pay

## 2020-01-09 DIAGNOSIS — M47817 Spondylosis without myelopathy or radiculopathy, lumbosacral region: Secondary | ICD-10-CM | POA: Diagnosis not present

## 2020-01-09 DIAGNOSIS — M47816 Spondylosis without myelopathy or radiculopathy, lumbar region: Secondary | ICD-10-CM | POA: Diagnosis not present

## 2020-01-09 DIAGNOSIS — M5136 Other intervertebral disc degeneration, lumbar region: Secondary | ICD-10-CM | POA: Diagnosis not present

## 2020-01-09 DIAGNOSIS — M5442 Lumbago with sciatica, left side: Secondary | ICD-10-CM | POA: Diagnosis not present

## 2020-01-09 DIAGNOSIS — M5432 Sciatica, left side: Secondary | ICD-10-CM | POA: Diagnosis not present

## 2020-01-09 DIAGNOSIS — I878 Other specified disorders of veins: Secondary | ICD-10-CM | POA: Diagnosis not present

## 2020-01-09 DIAGNOSIS — M160 Bilateral primary osteoarthritis of hip: Secondary | ICD-10-CM | POA: Diagnosis not present

## 2020-01-09 NOTE — Telephone Encounter (Signed)
  Incoming Patient Call  01/09/2020  What symptoms do you have? Left side pain that goes to her leg she has been sick all night  How long have you been sick? A while but got worse last week, was told to call back by MMM if not better  Have you been seen for this problem? Yes by MMM  If your provider decides to give you a prescription, which pharmacy would you like for it to be sent to? [facility]   Patient informed that this information will be sent to the clinical staff for review and that they should receive a follow up call.

## 2020-01-09 NOTE — Telephone Encounter (Signed)
Pt. Needs to be seen live, in office for this. Thanks, WS

## 2020-01-10 NOTE — Telephone Encounter (Signed)
Appt made.  Daughter aware

## 2020-01-10 NOTE — Telephone Encounter (Signed)
Lmtcb.

## 2020-01-11 ENCOUNTER — Ambulatory Visit (INDEPENDENT_AMBULATORY_CARE_PROVIDER_SITE_OTHER): Payer: Medicare HMO | Admitting: Family Medicine

## 2020-01-11 ENCOUNTER — Other Ambulatory Visit: Payer: Self-pay

## 2020-01-11 ENCOUNTER — Encounter: Payer: Self-pay | Admitting: Family Medicine

## 2020-01-11 VITALS — Temp 98.0°F | Ht 62.0 in | Wt 132.2 lb

## 2020-01-11 DIAGNOSIS — G5793 Unspecified mononeuropathy of bilateral lower limbs: Secondary | ICD-10-CM

## 2020-01-11 MED ORDER — TRAMADOL HCL 50 MG PO TABS: 50.0000 mg | ORAL_TABLET | Freq: Four times a day (QID) | ORAL | 0 refills | Status: AC

## 2020-01-11 MED ORDER — PREDNISONE 20 MG PO TABS: ORAL_TABLET | ORAL | 0 refills | Status: DC

## 2020-01-11 MED ORDER — PREGABALIN 50 MG PO CAPS: ORAL_CAPSULE | ORAL | 0 refills | Status: DC

## 2020-01-11 NOTE — Progress Notes (Signed)
Subjective:  Patient ID: Anita Perez, female    DOB: 02/01/1936  Age: 84 y.o. MRN: [id number]  CC: Leg Pain (Bilateral, Burns with Activity, Continuous shooting pain from hip to ankle)   HPI [name] presents for pain in the left lower extremity and right lower extremity at each shin area.  The left hurts more than the right.  No known factors exacerbate or relieve the pain.  Unknown onset seems to been insidious over several weeks.  Nothing seems to help.  She does mention that it is a burning sensation ambulates and it shoots from the hip to ankle as noted above.  Depression screen [facility] 2/9 01/11/2020 12/14/2019 11/23/2019  Decreased Interest 0 0 0  Down, Depressed, Hopeless 0 0 0  PHQ - 2 Score 0 0 0  Altered sleeping - - -  Tired, decreased energy - - -  Change in appetite - - -  Feeling bad or failure about yourself  - - -  Trouble concentrating - - -  Moving slowly or fidgety/restless - - -  PHQ-9 Score - - -    History Bernita has a past medical history of Arthritis, Colon polyps, DDD (degenerative disc disease), lumbar, Diverticulitis (1990s), GERD (gastroesophageal reflux disease), Hiatal hernia, Hyperlipidemia, Hypertension, Osteopenia, Rectal carcinoma ([place]), SBO (small bowel obstruction) ([place]) (04/25/2016), and Small bowel obstruction ([place]) (04/25/2016).   She has a past surgical history that includes intestinal  tear; Appendectomy; Cosmetic surgery (Left); Lumbar laminectomy/decompression microdiscectomy (Left, 06/14/2013); Knee arthroscopy (Right); Tumor removal (Left); Back surgery; Breast cyst excision (Left); Colostomy (1990s); Colostomy takedown (1990s); Colonoscopy w/ biopsies and polypectomy; and Colon surgery.   Her family history includes Cancer in her daughter, sister, and sister; Early death in her brother; Hypertension in her father and mother; Kidney disease in her father and mother.She reports that she quit smoking about 37 years ago. Her smoking use  included cigarettes. She has a 2.88 pack-year smoking history. She quit smokeless tobacco use about 73 years ago.  Her smokeless tobacco use included snuff. She reports previous alcohol use. She reports that she does not use drugs.    ROS Review of Systems  Constitutional: Positive for activity change (decreased). Negative for fever.    Objective:  Temp 98 F (36.7 C) (Temporal)    Ht 5\' 2"  (1.575 m)    Wt 132 lb 3.2 oz (60 kg)    BMI 24.18 kg/m   BP Readings from Last 3 Encounters:  12/14/19 130/82  06/15/19 (!) 158/74  01/18/19 137/73    Wt Readings from Last 3 Encounters:  01/11/20 132 lb 3.2 oz (60 kg)  12/14/19 138 lb (62.6 kg)  06/15/19 140 lb 6.4 oz (63.7 kg)     Physical Exam Constitutional:      General: She is not in acute distress.    Appearance: She is well-developed.  Cardiovascular:     Rate and Rhythm: Normal rate and regular rhythm.  Pulmonary:     Breath sounds: Normal breath sounds.  Musculoskeletal:     Comments: In wheelchair today    Skin:    General: Skin is warm and dry.  Neurological:     Mental Status: She is alert and oriented to person, place, and time.       Assessment & Plan:   Arthurine was seen today for leg pain.  Diagnoses and all orders for this visit:  Neuralgia of both lower extremities  Other orders -     pregabalin (LYRICA)  50 MG capsule; 1 qhs X7 days , then 2 qhs X 7d, then 3 qhs X 7d, then 4 qhs -     predniSONE (DELTASONE) 20 MG tablet; One twice daily with food for 10 more days -     traMADol (ULTRAM) 50 MG tablet; Take 1 tablet (50 mg total) by mouth 4 (four) times daily for 5 days. 1-2 tablets up to 4 times a day as needed for pain       I have discontinued [employer]. [place]'s predniSONE. I am also having her start on pregabalin, predniSONE, and traMADol. Additionally, I am having her maintain her aspirin, Calcium Carbonate-Vitamin D (CALCIUM + D PO), Vitamin D, multivitamin, celecoxib,  triamterene-hydrochlorothiazide, raloxifene, atorvastatin, pramipexole, amLODipine, and cyclobenzaprine.  Allergies as of 01/11/2020      Reactions   Diclofenac    SOB, nervous   Pepto-bismol [bismuth] Other (See Comments)   Turned mouth black inside   Ropinirole Other (See Comments)   Jittery      Medication List       Accurate as of January 11, 2020 11:59 PM. If you have any questions, ask your nurse or doctor.        STOP taking these medications   predniSONE 10 MG (21) Tbpk tablet Commonly known as: STERAPRED UNI-PAK 21 TAB Replaced by: predniSONE 20 MG tablet Stopped by: Claretta Fraise, MD     TAKE these medications   amLODipine 10 MG tablet Commonly known as: NORVASC TAKE 1 TABLET EVERY DAY   aspirin 81 MG tablet Take 81 mg by mouth daily.   atorvastatin 80 MG tablet Commonly known as: LIPITOR Take 1 tablet (80 mg total) by mouth daily.   CALCIUM + D PO Take 600 mg by mouth daily.   celecoxib 200 MG capsule Commonly known as: CeleBREX Take 1 capsule (200 mg total) by mouth 2 (two) times daily.   cyclobenzaprine 10 MG tablet Commonly known as: FLEXERIL Take by mouth.   multivitamin tablet Take 1 tablet by mouth daily.   pramipexole 0.125 MG tablet Commonly known as: MIRAPEX Take 1 tablet (0.125 mg total) by mouth at bedtime. For leg and foot cramping   predniSONE 20 MG tablet Commonly known as: DELTASONE One twice daily with food for 10 more days Replaces: predniSONE 10 MG (21) Tbpk tablet Started by: Claretta Fraise, MD   pregabalin 50 MG capsule Commonly known as: Lyrica 1 qhs X7 days , then 2 qhs X 7d, then 3 qhs X 7d, then 4 qhs Started by: Claretta Fraise, MD   raloxifene 60 MG tablet Commonly known as: Evista Take 1 tablet (60 mg total) by mouth daily. For bone health and breast cancer prevention   traMADol 50 MG tablet Commonly known as: ULTRAM Take 1 tablet (50 mg total) by mouth 4 (four) times daily for 5 days. 1-2 tablets up to 4  times a day as needed for pain Started by: Claretta Fraise, MD   triamterene-hydrochlorothiazide 37.5-25 MG tablet Commonly known as: MAXZIDE-25 Take 1 tablet by mouth daily.   Vitamin D 50 MCG (2000 UT) Caps Take 2,000 Units by mouth daily.        Follow-up: Return in about 3 weeks (around 02/01/2020).  Claretta Fraise, M.D.

## 2020-01-15 ENCOUNTER — Encounter: Payer: Self-pay | Admitting: Family Medicine

## 2020-01-31 DIAGNOSIS — Z23 Encounter for immunization: Secondary | ICD-10-CM | POA: Diagnosis not present

## 2020-02-02 ENCOUNTER — Encounter: Payer: Self-pay | Admitting: Family Medicine

## 2020-02-02 ENCOUNTER — Other Ambulatory Visit: Payer: Self-pay

## 2020-02-02 ENCOUNTER — Ambulatory Visit (INDEPENDENT_AMBULATORY_CARE_PROVIDER_SITE_OTHER): Payer: Medicare HMO | Admitting: Family Medicine

## 2020-02-02 VITALS — BP 132/76 | HR 71 | Temp 97.2°F | Resp 20 | Ht 62.0 in | Wt 136.1 lb

## 2020-02-02 DIAGNOSIS — M5416 Radiculopathy, lumbar region: Secondary | ICD-10-CM | POA: Diagnosis not present

## 2020-02-02 DIAGNOSIS — M5126 Other intervertebral disc displacement, lumbar region: Secondary | ICD-10-CM

## 2020-02-02 MED ORDER — PREGABALIN 300 MG PO CAPS: 300.0000 mg | ORAL_CAPSULE | Freq: Every day | ORAL | 2 refills | Status: DC

## 2020-02-02 NOTE — Progress Notes (Addendum)
Subjective:  Patient ID: Anita Perez, female    DOB: Jan 25, 1936  Age: 84 y.o. MRN: [id number]  CC: 3 week follow up   HPI Anita Perez presents for relief of chronic pain. Requests referral to pain clinic. Has had no relief from Lyrica to date. Denies side effects. Daughter accompanies her and expresses concern about how to administer Lyrica. Giving it BID. [name] denies side effects..  Depression screen [facility] 2/9 02/02/2020 01/11/2020 12/14/2019  Decreased Interest 0 0 0  Down, Depressed, Hopeless 0 0 0  PHQ - 2 Score 0 0 0  Altered sleeping - - -  Tired, decreased energy - - -  Change in appetite - - -  Feeling bad or failure about yourself  - - -  Trouble concentrating - - -  Moving slowly or fidgety/restless - - -  PHQ-9 Score - - -    History Solara has a past medical history of Arthritis, Colon polyps, DDD (degenerative disc disease), lumbar, Diverticulitis (1990s), GERD (gastroesophageal reflux disease), Hiatal hernia, Hyperlipidemia, Hypertension, Osteopenia, Rectal carcinoma ([place]), SBO (small bowel obstruction) ([place]) (04/25/2016), and Small bowel obstruction ([place]) (04/25/2016).   She has a past surgical history that includes intestinal  tear; Appendectomy; Cosmetic surgery (Left); Lumbar laminectomy/decompression microdiscectomy (Left, 06/14/2013); Knee arthroscopy (Right); Tumor removal (Left); Back surgery; Breast cyst excision (Left); Colostomy (1990s); Colostomy takedown (1990s); Colonoscopy w/ biopsies and polypectomy; and Colon surgery.   Her family history includes Cancer in her daughter, sister, and sister; Early death in her brother; Hypertension in her father and mother; Kidney disease in her father and mother.She reports that she quit smoking about 37 years ago. Her smoking use included cigarettes. She has a 2.88 pack-year smoking history. She quit smokeless tobacco use about 74 years ago.  Her smokeless tobacco use included snuff. She reports previous alcohol  use. She reports that she does not use drugs.    ROS Review of Systems  Constitutional: Negative.   HENT: Negative.   Eyes: Negative for visual disturbance.  Respiratory: Negative for shortness of breath.   Cardiovascular: Negative for chest pain.  Gastrointestinal: Negative for abdominal pain.  Musculoskeletal: Positive for arthralgias.    Objective:  BP 132/76   Pulse 71   Temp (!) 97.2 F (36.2 C) (Temporal)   Resp 20   Ht 5\' 2"  (1.575 m)   Wt 136 lb 2 oz (61.7 kg)   SpO2 98%   BMI 24.90 kg/m   BP Readings from Last 3 Encounters:  02/02/20 132/76  12/14/19 130/82  06/15/19 (!) 158/74    Wt Readings from Last 3 Encounters:  02/02/20 136 lb 2 oz (61.7 kg)  01/11/20 132 lb 3.2 oz (60 kg)  12/14/19 138 lb (62.6 kg)     Physical Exam Constitutional:      General: She is not in acute distress.    Appearance: She is well-developed and well-nourished.  Cardiovascular:     Rate and Rhythm: Normal rate and regular rhythm.  Pulmonary:     Breath sounds: Normal breath sounds.  Skin:    General: Skin is warm and dry.  Neurological:     Mental Status: She is alert and oriented to person, place, and time.  Psychiatric:        Mood and Affect: Mood and affect normal.       Assessment & Plan:   Jamyia was seen today for 3 week follow up.  Diagnoses and all orders for this visit:  Lumbar disc  herniation -     Walker rolling  Lumbar radiculopathy  Other orders -     pregabalin (LYRICA) 300 MG capsule; Take 1 capsule (300 mg total) by mouth daily.       I have changed [employer]. Maj's pregabalin. I am also having her maintain her aspirin, Calcium Carbonate-Vitamin D (CALCIUM + D PO), Vitamin D, multivitamin, celecoxib, triamterene-hydrochlorothiazide, raloxifene, atorvastatin, pramipexole, amLODipine, cyclobenzaprine, and predniSONE.  Allergies as of 02/02/2020      Reactions   Diclofenac    SOB, nervous   Pepto-bismol [bismuth] Other (See  Comments)   Turned mouth black inside   Ropinirole Other (See Comments)   Jittery      Medication List       Accurate as of February 02, 2020 11:59 PM. If you have any questions, ask your nurse or doctor.        amLODipine 10 MG tablet Commonly known as: NORVASC TAKE 1 TABLET EVERY DAY   aspirin 81 MG tablet Take 81 mg by mouth daily.   atorvastatin 80 MG tablet Commonly known as: LIPITOR Take 1 tablet (80 mg total) by mouth daily.   CALCIUM + D PO Take 600 mg by mouth daily.   celecoxib 200 MG capsule Commonly known as: CeleBREX Take 1 capsule (200 mg total) by mouth 2 (two) times daily.   cyclobenzaprine 10 MG tablet Commonly known as: FLEXERIL Take by mouth.   multivitamin tablet Take 1 tablet by mouth daily.   pramipexole 0.125 MG tablet Commonly known as: MIRAPEX Take 1 tablet (0.125 mg total) by mouth at bedtime. For leg and foot cramping What changed:   when to take this  reasons to take this   predniSONE 20 MG tablet Commonly known as: DELTASONE One twice daily with food for 10 more days   pregabalin 300 MG capsule Commonly known as: Lyrica Take 1 capsule (300 mg total) by mouth daily. What changed:   medication strength  how much to take  how to take this  when to take this  additional instructions Changed by: Claretta Fraise, MD   raloxifene 60 MG tablet Commonly known as: Evista Take 1 tablet (60 mg total) by mouth daily. For bone health and breast cancer prevention   triamterene-hydrochlorothiazide 37.5-25 MG tablet Commonly known as: MAXZIDE-25 Take 1 tablet by mouth daily.   Vitamin D 50 MCG (2000 UT) Caps Take 2,000 Units by mouth daily.        Follow-up: Return in about 6 weeks (around 03/15/2020).  Claretta Fraise, M.D.

## 2020-02-04 ENCOUNTER — Encounter: Payer: Self-pay | Admitting: Family Medicine

## 2020-02-20 ENCOUNTER — Telehealth: Payer: Self-pay

## 2020-02-20 NOTE — Telephone Encounter (Signed)
Daughter aware and verbalizes understanding. 

## 2020-02-20 NOTE — Telephone Encounter (Signed)
Talked with daughter, she has been giving patient Pregabalin before bed, she is sleeping but awakes with pain that patient states is about an 8 out of 10 and daughter is giving her another Pregabalin. Patient has therefore run out of medication today. Has appointment with pain management tomorrow but will need medication for today & tomorrow. Please advise on this, will need new Rx if she is to take this BID

## 2020-02-20 NOTE — Telephone Encounter (Signed)
It is inappropriate to give extra pregabalin. I cannot give extra as a result.

## 2020-02-21 ENCOUNTER — Other Ambulatory Visit: Payer: Self-pay | Admitting: Pain Medicine

## 2020-02-21 DIAGNOSIS — M5440 Lumbago with sciatica, unspecified side: Secondary | ICD-10-CM

## 2020-02-21 DIAGNOSIS — M5416 Radiculopathy, lumbar region: Secondary | ICD-10-CM | POA: Insufficient documentation

## 2020-02-22 ENCOUNTER — Other Ambulatory Visit: Payer: Self-pay | Admitting: *Deleted
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
## 2020-03-01 ENCOUNTER — Other Ambulatory Visit: Payer: 59

## 2020-03-04 ENCOUNTER — Ambulatory Visit
Admission: RE | Admit: 2020-03-04 | Discharge: 2020-03-04 | Disposition: A | Discharge status: Home / Self Care | Payer: 59 | Source: Ambulatory Visit | Attending: Pain Medicine | Admitting: Pain Medicine

## 2020-03-04 DIAGNOSIS — M48061 Spinal stenosis, lumbar region without neurogenic claudication: Secondary | ICD-10-CM | POA: Diagnosis not present

## 2020-03-04 DIAGNOSIS — M5440 Lumbago with sciatica, unspecified side: Secondary | ICD-10-CM

## 2020-03-04 DIAGNOSIS — M5127 Other intervertebral disc displacement, lumbosacral region: Secondary | ICD-10-CM | POA: Diagnosis not present

## 2020-03-04 MED ORDER — GADOBENATE DIMEGLUMINE 529 MG/ML IV SOLN
10.0000 mL | Freq: Once | INTRAVENOUS | Status: AC | PRN
  Administered 2020-03-04: 10 mL via INTRAVENOUS

## 2020-03-15 ENCOUNTER — Ambulatory Visit (INDEPENDENT_AMBULATORY_CARE_PROVIDER_SITE_OTHER): Payer: Medicare HMO | Admitting: Family Medicine

## 2020-03-15 ENCOUNTER — Other Ambulatory Visit: Payer: Self-pay

## 2020-03-15 ENCOUNTER — Encounter: Payer: Self-pay | Admitting: Family Medicine

## 2020-03-15 VITALS — BP 123/68 | HR 91 | Temp 97.6°F | Ht 62.0 in | Wt 135.6 lb

## 2020-03-15 DIAGNOSIS — M5126 Other intervertebral disc displacement, lumbar region: Secondary | ICD-10-CM

## 2020-03-15 NOTE — Progress Notes (Signed)
Subjective:  Patient ID: Anita Perez, female    DOB: 28-Aug-1935  Age: 84 y.o. MRN: [id number]  CC: Back Pain (6 wk ckup)   HPI Anita Perez presents for recheck of pain. Ran out of pregabalin. Didn't know what to do next.  She has been prescribed Norco by the pain specialist.  She is having problem constipation since then.  Depression screen [facility] 2/9 03/15/2020 02/02/2020 01/11/2020  Decreased Interest 1 0 0  Down, Depressed, Hopeless 1 0 0  PHQ - 2 Score 2 0 0  Altered sleeping 0 - -  Tired, decreased energy 0 - -  Change in appetite 0 - -  Feeling bad or failure about yourself  0 - -  Trouble concentrating 0 - -  Moving slowly or fidgety/restless 0 - -  Suicidal thoughts 0 - -  PHQ-9 Score 2 - -    [name] has a past medical history of Arthritis, Colon polyps, DDD (degenerative disc disease), lumbar, Diverticulitis (1990s), GERD (gastroesophageal reflux disease), Hiatal hernia, Hyperlipidemia, Hypertension, Osteopenia, Rectal carcinoma ([place]), SBO (small bowel obstruction) ([place]) (04/25/2016), and Small bowel obstruction ([place]) (04/25/2016).   She has a past surgical history that includes intestinal  tear; Appendectomy; Cosmetic surgery (Left); Lumbar laminectomy/decompression microdiscectomy (Left, 06/14/2013); Knee arthroscopy (Right); Tumor removal (Left); Back surgery; Breast cyst excision (Left); Colostomy (1990s); Colostomy takedown (1990s); Colonoscopy w/ biopsies and polypectomy; and Colon surgery.   Her family history includes Cancer in her daughter, sister, and sister; Early death in her brother; Hypertension in her father and mother; Kidney disease in her father and mother.She reports that she quit smoking about 37 years ago. Her smoking use included cigarettes. She has a 2.88 pack-year smoking history. She quit smokeless tobacco use about 74 years ago.  Her smokeless tobacco use included snuff. She reports previous alcohol use. She reports that she does not use  drugs.    ROS Review of Systems  Constitutional: Negative.   HENT: Negative.   Eyes: Negative for visual disturbance.  Respiratory: Negative for shortness of breath.   Cardiovascular: Negative for chest pain.  Gastrointestinal: Negative for abdominal pain.  Musculoskeletal: Positive for back pain. Negative for arthralgias.    Objective:  BP 123/68   Pulse 91   Temp 97.6 F (36.4 C) (Temporal)   Ht 5' 2" (1.575 m)   Wt 135 lb 9.6 oz (61.5 kg)   BMI 24.80 kg/m   BP Readings from Last 3 Encounters:  03/15/20 123/68  02/02/20 132/76  12/14/19 130/82    Wt Readings from Last 3 Encounters:  03/15/20 135 lb 9.6 oz (61.5 kg)  02/02/20 136 lb 2 oz (61.7 kg)  01/11/20 132 lb 3.2 oz (60 kg)     Physical Exam Constitutional:      General: She is not in acute distress.    Appearance: She is well-developed.  HENT:     Head: Normocephalic and atraumatic.  Eyes:     Conjunctiva/sclera: Conjunctivae normal.     Pupils: Pupils are equal, round, and reactive to light.  Neck:     Thyroid: No thyromegaly.  Cardiovascular:     Rate and Rhythm: Normal rate and regular rhythm.     Heart sounds: Normal heart sounds. No murmur heard.   Pulmonary:     Effort: Pulmonary effort is normal. No respiratory distress.     Breath sounds: Normal breath sounds. No wheezing or rales.  Abdominal:     General: Bowel sounds are normal. There is  no distension.     Palpations: Abdomen is soft.     Tenderness: There is no abdominal tenderness.  Musculoskeletal:        General: Normal range of motion.     Cervical back: Normal range of motion and neck supple.  Lymphadenopathy:     Cervical: No cervical adenopathy.  Skin:    General: Skin is warm and dry.  Neurological:     Mental Status: She is alert and oriented to person, place, and time.  Psychiatric:        Behavior: Behavior normal.        Thought Content: Thought content normal.        Judgment: Judgment normal.        Assessment & Plan:   Anita Perez was seen today for back pain.  Diagnoses and all orders for this visit:  Lumbar disc herniation -     CMP14+EGFR       I am having Anita Perez maintain her aspirin, Calcium Carbonate-Vitamin D (CALCIUM + D PO), Vitamin D, multivitamin, celecoxib, triamterene-hydrochlorothiazide, raloxifene, atorvastatin, pramipexole, amLODipine, cyclobenzaprine, predniSONE, pregabalin, and HYDROcodone-acetaminophen.  Allergies as of 03/15/2020      Reactions   Diclofenac    SOB, nervous   Pepto-bismol [bismuth] Other (See Comments)   Turned mouth black inside   Ropinirole Other (See Comments)   Jittery      Medication List       Accurate as of March 15, 2020 11:59 PM. If you have any questions, ask your nurse or doctor.        amLODipine 10 MG tablet Commonly known as: NORVASC TAKE 1 TABLET EVERY DAY   aspirin 81 MG tablet Take 81 mg by mouth daily.   atorvastatin 80 MG tablet Commonly known as: LIPITOR Take 1 tablet (80 mg total) by mouth daily.   CALCIUM + D PO Take 600 mg by mouth daily.   celecoxib 200 MG capsule Commonly known as: CeleBREX Take 1 capsule (200 mg total) by mouth 2 (two) times daily.   cyclobenzaprine 10 MG tablet Commonly known as: FLEXERIL Take by mouth.   HYDROcodone-acetaminophen 5-325 MG tablet Commonly known as: NORCO/VICODIN Take 0.5-1 tablets by mouth every 12 (twelve) hours as needed.   multivitamin tablet Take 1 tablet by mouth daily.   pramipexole 0.125 MG tablet Commonly known as: MIRAPEX Take 1 tablet (0.125 mg total) by mouth at bedtime. For leg and foot cramping What changed:   when to take this  reasons to take this   predniSONE 20 MG tablet Commonly known as: DELTASONE One twice daily with food for 10 more days   pregabalin 300 MG capsule Commonly known as: Lyrica Take 1 capsule (300 mg total) by mouth daily.   raloxifene 60 MG tablet Commonly known as: Evista Take 1  tablet (60 mg total) by mouth daily. For bone health and breast cancer prevention   triamterene-hydrochlorothiazide 37.5-25 MG tablet Commonly known as: MAXZIDE-25 Take 1 tablet by mouth daily.   Vitamin D 50 MCG (2000 UT) Caps Take 2,000 Units by mouth daily.        Follow-up: No follow-ups on file.  Warren Stacks, M.D. 

## 2020-03-16 LAB — CMP14+EGFR
ALT: 14 IU/L (ref 0–32)
AST: 19 IU/L (ref 0–40)
Albumin/Globulin Ratio: 1.6 (ref 1.2–2.2)
Albumin: 4 g/dL (ref 3.6–4.6)
Alkaline Phosphatase: 69 IU/L (ref 44–121)
BUN/Creatinine Ratio: 19 (ref 12–28)
BUN: 16 mg/dL (ref 8–27)
Bilirubin Total: 0.3 mg/dL (ref 0.0–1.2)
CO2: 25 mmol/L (ref 20–29)
Calcium: 9.4 mg/dL (ref 8.7–10.3)
Chloride: 102 mmol/L (ref 96–106)
Creatinine, Ser: 0.86 mg/dL (ref 0.57–1.00)
GFR calc Af Amer: 73 mL/min/{1.73_m2} (ref >59)
GFR calc non Af Amer: 63 mL/min/{1.73_m2} (ref >59)
Globulin, Total: 2.5 g/dL (ref 1.5–4.5)
Glucose: 95 mg/dL (ref 65–99)
Potassium: 3.8 mmol/L (ref 3.5–5.2)
Sodium: 143 mmol/L (ref 134–144)
Total Protein: 6.5 g/dL (ref 6.0–8.5)

## 2020-03-17 NOTE — Progress Notes (Signed)
Hello Anita Perez,  Your lab result is normal and/or stable.Some minor variations that are not significant are commonly marked abnormal, but do not represent any medical problem for you.  Best regards, Phoenix Riesen, M.D.

## 2020-03-19 ENCOUNTER — Telehealth: Payer: Self-pay

## 2020-03-19 MED ORDER — POLYETHYLENE GLYCOL 3350 17 GM/SCOOP PO POWD: 17.0000 g | Freq: Every day | ORAL | 1 refills | Status: DC | PRN

## 2020-03-19 NOTE — Telephone Encounter (Signed)
Patient aware and verbalizes understanding. 

## 2020-03-19 NOTE — Telephone Encounter (Signed)
Pts daughter called stating that pt had an appt with Dr Livia Snellen last week and he told pt that he would call some medicine in to the pharmacy for her regarding her constipation. Says they are still waiting for that Rx to be sent to the pharmacy.  [facility] [place].  Also wants to know if pt can take Celebix for pain?

## 2020-03-19 NOTE — Telephone Encounter (Signed)
Patient was seen 12/16 Nothing was sent in PCP OFF all week Please review and advise

## 2020-03-19 NOTE — Telephone Encounter (Signed)
Please let the patient know that I sent in MiraLAX for her, I do not know what he was intending to send in for constipation but I sent that in.  As far as the Celebrex it looks like it is on her current medication list, I do not know why he would not have wanted her to take that or to take it.  That is something she may have to discuss further with him. Caryl Pina, MD [facility] 03/19/2020, 1:19 PM

## 2020-03-20 ENCOUNTER — Other Ambulatory Visit: Payer: Self-pay | Admitting: Family Medicine

## 2020-03-20 NOTE — Telephone Encounter (Signed)
Pharmacy comment: Product Backordered/Unavailable:PRESCRIBED PRODUCT NOT IN STOCK. PLEASE CONSIDER THE COST-EFFECTIVE POTENTIAL ALTERNATIVE(S) LISTED AND EVALUATE IF APPROPRIATE FOR YOUR PATIENT'S INDICATION AND TREATMENT GOALS.

## 2020-03-28 ENCOUNTER — Telehealth: Payer: Self-pay

## 2020-03-28 DIAGNOSIS — K219 Gastro-esophageal reflux disease without esophagitis: Secondary | ICD-10-CM | POA: Diagnosis not present

## 2020-03-28 DIAGNOSIS — M47816 Spondylosis without myelopathy or radiculopathy, lumbar region: Secondary | ICD-10-CM | POA: Diagnosis not present

## 2020-03-28 DIAGNOSIS — K449 Diaphragmatic hernia without obstruction or gangrene: Secondary | ICD-10-CM | POA: Diagnosis not present

## 2020-03-28 DIAGNOSIS — M4800 Spinal stenosis, site unspecified: Secondary | ICD-10-CM | POA: Diagnosis not present

## 2020-03-28 DIAGNOSIS — M5116 Intervertebral disc disorders with radiculopathy, lumbar region: Secondary | ICD-10-CM | POA: Diagnosis not present

## 2020-03-28 DIAGNOSIS — E785 Hyperlipidemia, unspecified: Secondary | ICD-10-CM | POA: Diagnosis not present

## 2020-03-28 DIAGNOSIS — M1990 Unspecified osteoarthritis, unspecified site: Secondary | ICD-10-CM | POA: Diagnosis not present

## 2020-03-28 DIAGNOSIS — I10 Essential (primary) hypertension: Secondary | ICD-10-CM | POA: Diagnosis not present

## 2020-03-28 DIAGNOSIS — G2581 Restless legs syndrome: Secondary | ICD-10-CM | POA: Diagnosis not present

## 2020-03-28 NOTE — Telephone Encounter (Signed)
Felisha with [facility] called for verbal orders for pt to receive PT twice per week for 6 weeks then once per week for 2 weeks.  Verbal order given.

## 2020-04-03 DIAGNOSIS — K449 Diaphragmatic hernia without obstruction or gangrene: Secondary | ICD-10-CM | POA: Diagnosis not present

## 2020-04-03 DIAGNOSIS — M4800 Spinal stenosis, site unspecified: Secondary | ICD-10-CM | POA: Diagnosis not present

## 2020-04-03 DIAGNOSIS — K219 Gastro-esophageal reflux disease without esophagitis: Secondary | ICD-10-CM | POA: Diagnosis not present

## 2020-04-03 DIAGNOSIS — M47816 Spondylosis without myelopathy or radiculopathy, lumbar region: Secondary | ICD-10-CM | POA: Diagnosis not present

## 2020-04-03 DIAGNOSIS — M5116 Intervertebral disc disorders with radiculopathy, lumbar region: Secondary | ICD-10-CM | POA: Diagnosis not present

## 2020-04-03 DIAGNOSIS — I10 Essential (primary) hypertension: Secondary | ICD-10-CM | POA: Diagnosis not present

## 2020-04-03 DIAGNOSIS — M1990 Unspecified osteoarthritis, unspecified site: Secondary | ICD-10-CM | POA: Diagnosis not present

## 2020-04-03 DIAGNOSIS — G2581 Restless legs syndrome: Secondary | ICD-10-CM | POA: Diagnosis not present

## 2020-04-03 DIAGNOSIS — E785 Hyperlipidemia, unspecified: Secondary | ICD-10-CM | POA: Diagnosis not present

## 2020-04-05 DIAGNOSIS — E785 Hyperlipidemia, unspecified: Secondary | ICD-10-CM | POA: Diagnosis not present

## 2020-04-05 DIAGNOSIS — K449 Diaphragmatic hernia without obstruction or gangrene: Secondary | ICD-10-CM | POA: Diagnosis not present

## 2020-04-05 DIAGNOSIS — I10 Essential (primary) hypertension: Secondary | ICD-10-CM | POA: Diagnosis not present

## 2020-04-05 DIAGNOSIS — G2581 Restless legs syndrome: Secondary | ICD-10-CM | POA: Diagnosis not present

## 2020-04-05 DIAGNOSIS — M1990 Unspecified osteoarthritis, unspecified site: Secondary | ICD-10-CM | POA: Diagnosis not present

## 2020-04-05 DIAGNOSIS — M47816 Spondylosis without myelopathy or radiculopathy, lumbar region: Secondary | ICD-10-CM | POA: Diagnosis not present

## 2020-04-05 DIAGNOSIS — M5116 Intervertebral disc disorders with radiculopathy, lumbar region: Secondary | ICD-10-CM | POA: Diagnosis not present

## 2020-04-05 DIAGNOSIS — M4800 Spinal stenosis, site unspecified: Secondary | ICD-10-CM | POA: Diagnosis not present

## 2020-04-05 DIAGNOSIS — K219 Gastro-esophageal reflux disease without esophagitis: Secondary | ICD-10-CM | POA: Diagnosis not present

## 2020-04-09 DIAGNOSIS — M5416 Radiculopathy, lumbar region: Secondary | ICD-10-CM | POA: Diagnosis not present

## 2020-04-11 DIAGNOSIS — M5116 Intervertebral disc disorders with radiculopathy, lumbar region: Secondary | ICD-10-CM | POA: Diagnosis not present

## 2020-04-11 DIAGNOSIS — M4800 Spinal stenosis, site unspecified: Secondary | ICD-10-CM | POA: Diagnosis not present

## 2020-04-11 DIAGNOSIS — G2581 Restless legs syndrome: Secondary | ICD-10-CM | POA: Diagnosis not present

## 2020-04-11 DIAGNOSIS — K219 Gastro-esophageal reflux disease without esophagitis: Secondary | ICD-10-CM | POA: Diagnosis not present

## 2020-04-11 DIAGNOSIS — M1990 Unspecified osteoarthritis, unspecified site: Secondary | ICD-10-CM | POA: Diagnosis not present

## 2020-04-11 DIAGNOSIS — I10 Essential (primary) hypertension: Secondary | ICD-10-CM | POA: Diagnosis not present

## 2020-04-11 DIAGNOSIS — E785 Hyperlipidemia, unspecified: Secondary | ICD-10-CM | POA: Diagnosis not present

## 2020-04-11 DIAGNOSIS — M47816 Spondylosis without myelopathy or radiculopathy, lumbar region: Secondary | ICD-10-CM | POA: Diagnosis not present

## 2020-04-11 DIAGNOSIS — K449 Diaphragmatic hernia without obstruction or gangrene: Secondary | ICD-10-CM | POA: Diagnosis not present

## 2020-04-13 DIAGNOSIS — I10 Essential (primary) hypertension: Secondary | ICD-10-CM | POA: Diagnosis not present

## 2020-04-13 DIAGNOSIS — K449 Diaphragmatic hernia without obstruction or gangrene: Secondary | ICD-10-CM | POA: Diagnosis not present

## 2020-04-13 DIAGNOSIS — K219 Gastro-esophageal reflux disease without esophagitis: Secondary | ICD-10-CM | POA: Diagnosis not present

## 2020-04-13 DIAGNOSIS — M47816 Spondylosis without myelopathy or radiculopathy, lumbar region: Secondary | ICD-10-CM | POA: Diagnosis not present

## 2020-04-13 DIAGNOSIS — G2581 Restless legs syndrome: Secondary | ICD-10-CM | POA: Diagnosis not present

## 2020-04-13 DIAGNOSIS — M1990 Unspecified osteoarthritis, unspecified site: Secondary | ICD-10-CM | POA: Diagnosis not present

## 2020-04-13 DIAGNOSIS — E785 Hyperlipidemia, unspecified: Secondary | ICD-10-CM | POA: Diagnosis not present

## 2020-04-13 DIAGNOSIS — M4800 Spinal stenosis, site unspecified: Secondary | ICD-10-CM | POA: Diagnosis not present

## 2020-04-13 DIAGNOSIS — M5116 Intervertebral disc disorders with radiculopathy, lumbar region: Secondary | ICD-10-CM | POA: Diagnosis not present

## 2020-04-19 DIAGNOSIS — E785 Hyperlipidemia, unspecified: Secondary | ICD-10-CM | POA: Diagnosis not present

## 2020-04-19 DIAGNOSIS — K449 Diaphragmatic hernia without obstruction or gangrene: Secondary | ICD-10-CM | POA: Diagnosis not present

## 2020-04-19 DIAGNOSIS — M1990 Unspecified osteoarthritis, unspecified site: Secondary | ICD-10-CM | POA: Diagnosis not present

## 2020-04-19 DIAGNOSIS — G2581 Restless legs syndrome: Secondary | ICD-10-CM | POA: Diagnosis not present

## 2020-04-19 DIAGNOSIS — K219 Gastro-esophageal reflux disease without esophagitis: Secondary | ICD-10-CM | POA: Diagnosis not present

## 2020-04-19 DIAGNOSIS — M5116 Intervertebral disc disorders with radiculopathy, lumbar region: Secondary | ICD-10-CM | POA: Diagnosis not present

## 2020-04-19 DIAGNOSIS — M4800 Spinal stenosis, site unspecified: Secondary | ICD-10-CM | POA: Diagnosis not present

## 2020-04-19 DIAGNOSIS — M47816 Spondylosis without myelopathy or radiculopathy, lumbar region: Secondary | ICD-10-CM | POA: Diagnosis not present

## 2020-04-19 DIAGNOSIS — I10 Essential (primary) hypertension: Secondary | ICD-10-CM | POA: Diagnosis not present

## 2020-04-20 DIAGNOSIS — K219 Gastro-esophageal reflux disease without esophagitis: Secondary | ICD-10-CM | POA: Diagnosis not present

## 2020-04-20 DIAGNOSIS — G2581 Restless legs syndrome: Secondary | ICD-10-CM | POA: Diagnosis not present

## 2020-04-20 DIAGNOSIS — M4800 Spinal stenosis, site unspecified: Secondary | ICD-10-CM | POA: Diagnosis not present

## 2020-04-20 DIAGNOSIS — M5116 Intervertebral disc disorders with radiculopathy, lumbar region: Secondary | ICD-10-CM | POA: Diagnosis not present

## 2020-04-20 DIAGNOSIS — M47816 Spondylosis without myelopathy or radiculopathy, lumbar region: Secondary | ICD-10-CM | POA: Diagnosis not present

## 2020-04-20 DIAGNOSIS — K449 Diaphragmatic hernia without obstruction or gangrene: Secondary | ICD-10-CM | POA: Diagnosis not present

## 2020-04-20 DIAGNOSIS — M1990 Unspecified osteoarthritis, unspecified site: Secondary | ICD-10-CM | POA: Diagnosis not present

## 2020-04-20 DIAGNOSIS — E785 Hyperlipidemia, unspecified: Secondary | ICD-10-CM | POA: Diagnosis not present

## 2020-04-20 DIAGNOSIS — I10 Essential (primary) hypertension: Secondary | ICD-10-CM | POA: Diagnosis not present

## 2020-04-23 ENCOUNTER — Telehealth: Payer: Self-pay | Admitting: *Deleted

## 2020-04-23 DIAGNOSIS — K449 Diaphragmatic hernia without obstruction or gangrene: Secondary | ICD-10-CM | POA: Diagnosis not present

## 2020-04-23 DIAGNOSIS — M5116 Intervertebral disc disorders with radiculopathy, lumbar region: Secondary | ICD-10-CM | POA: Diagnosis not present

## 2020-04-23 DIAGNOSIS — K219 Gastro-esophageal reflux disease without esophagitis: Secondary | ICD-10-CM | POA: Diagnosis not present

## 2020-04-23 DIAGNOSIS — G2581 Restless legs syndrome: Secondary | ICD-10-CM | POA: Diagnosis not present

## 2020-04-23 DIAGNOSIS — M4800 Spinal stenosis, site unspecified: Secondary | ICD-10-CM | POA: Diagnosis not present

## 2020-04-23 DIAGNOSIS — E785 Hyperlipidemia, unspecified: Secondary | ICD-10-CM | POA: Diagnosis not present

## 2020-04-23 DIAGNOSIS — I10 Essential (primary) hypertension: Secondary | ICD-10-CM | POA: Diagnosis not present

## 2020-04-23 DIAGNOSIS — M1990 Unspecified osteoarthritis, unspecified site: Secondary | ICD-10-CM | POA: Diagnosis not present

## 2020-04-23 DIAGNOSIS — M47816 Spondylosis without myelopathy or radiculopathy, lumbar region: Secondary | ICD-10-CM | POA: Diagnosis not present

## 2020-04-23 NOTE — Telephone Encounter (Signed)
TC with [place] from [place] at [address] couple visits w/ patient over couple weeks her BPs have stayed in the 150s/90s or 100s she was taking her BP meds in the evenings, but has since changed to mornings, and this has not reduced her readings. Please advise

## 2020-04-24 ENCOUNTER — Other Ambulatory Visit: Payer: Self-pay

## 2020-04-24 ENCOUNTER — Other Ambulatory Visit: Payer: Self-pay | Admitting: Family Medicine

## 2020-04-24 ENCOUNTER — Ambulatory Visit (INDEPENDENT_AMBULATORY_CARE_PROVIDER_SITE_OTHER): Payer: Medicare HMO

## 2020-04-24 DIAGNOSIS — M5116 Intervertebral disc disorders with radiculopathy, lumbar region: Secondary | ICD-10-CM

## 2020-04-24 DIAGNOSIS — K449 Diaphragmatic hernia without obstruction or gangrene: Secondary | ICD-10-CM

## 2020-04-24 DIAGNOSIS — I10 Essential (primary) hypertension: Secondary | ICD-10-CM

## 2020-04-24 DIAGNOSIS — M5441 Lumbago with sciatica, right side: Secondary | ICD-10-CM

## 2020-04-24 DIAGNOSIS — Z86010 Personal history of colonic polyps: Secondary | ICD-10-CM

## 2020-04-24 DIAGNOSIS — K219 Gastro-esophageal reflux disease without esophagitis: Secondary | ICD-10-CM | POA: Diagnosis not present

## 2020-04-24 DIAGNOSIS — M4800 Spinal stenosis, site unspecified: Secondary | ICD-10-CM

## 2020-04-24 DIAGNOSIS — M8580 Other specified disorders of bone density and structure, unspecified site: Secondary | ICD-10-CM

## 2020-04-24 DIAGNOSIS — M47816 Spondylosis without myelopathy or radiculopathy, lumbar region: Secondary | ICD-10-CM | POA: Diagnosis not present

## 2020-04-24 DIAGNOSIS — R32 Unspecified urinary incontinence: Secondary | ICD-10-CM

## 2020-04-24 DIAGNOSIS — E785 Hyperlipidemia, unspecified: Secondary | ICD-10-CM | POA: Diagnosis not present

## 2020-04-24 DIAGNOSIS — G8929 Other chronic pain: Secondary | ICD-10-CM

## 2020-04-24 DIAGNOSIS — Z85048 Personal history of other malignant neoplasm of rectum, rectosigmoid junction, and anus: Secondary | ICD-10-CM

## 2020-04-24 DIAGNOSIS — G2581 Restless legs syndrome: Secondary | ICD-10-CM

## 2020-04-24 DIAGNOSIS — Z7982 Long term (current) use of aspirin: Secondary | ICD-10-CM

## 2020-04-24 DIAGNOSIS — M1990 Unspecified osteoarthritis, unspecified site: Secondary | ICD-10-CM | POA: Diagnosis not present

## 2020-04-24 DIAGNOSIS — Z87891 Personal history of nicotine dependence: Secondary | ICD-10-CM

## 2020-04-24 DIAGNOSIS — E559 Vitamin D deficiency, unspecified: Secondary | ICD-10-CM

## 2020-04-24 DIAGNOSIS — M5442 Lumbago with sciatica, left side: Secondary | ICD-10-CM

## 2020-04-24 MED ORDER — VALSARTAN-HYDROCHLOROTHIAZIDE 160-25 MG PO TABS: 1.0000 | ORAL_TABLET | Freq: Every day | ORAL | 2 refills | Status: DC

## 2020-04-24 NOTE — Telephone Encounter (Signed)
LMOVM with detailed instructions on change in BP meds and to call be if need any clarification with changes. To continue monitoring BP readings

## 2020-04-24 NOTE — Telephone Encounter (Signed)
Instead of the Maxide 25 I would like for her to take valsartan HCTZ also known as Diovan HCT, she can discontinue the Maxide and takes the Diovan at the same time in the morning she was taking for the [facility].  She should continue the amlodipine 1 every morning

## 2020-04-25 DIAGNOSIS — M4800 Spinal stenosis, site unspecified: Secondary | ICD-10-CM | POA: Diagnosis not present

## 2020-04-25 DIAGNOSIS — M5116 Intervertebral disc disorders with radiculopathy, lumbar region: Secondary | ICD-10-CM | POA: Diagnosis not present

## 2020-04-25 DIAGNOSIS — K219 Gastro-esophageal reflux disease without esophagitis: Secondary | ICD-10-CM | POA: Diagnosis not present

## 2020-04-25 DIAGNOSIS — I10 Essential (primary) hypertension: Secondary | ICD-10-CM | POA: Diagnosis not present

## 2020-04-25 DIAGNOSIS — G2581 Restless legs syndrome: Secondary | ICD-10-CM | POA: Diagnosis not present

## 2020-04-25 DIAGNOSIS — M1990 Unspecified osteoarthritis, unspecified site: Secondary | ICD-10-CM | POA: Diagnosis not present

## 2020-04-25 DIAGNOSIS — M47816 Spondylosis without myelopathy or radiculopathy, lumbar region: Secondary | ICD-10-CM | POA: Diagnosis not present

## 2020-04-25 DIAGNOSIS — E785 Hyperlipidemia, unspecified: Secondary | ICD-10-CM | POA: Diagnosis not present

## 2020-04-25 DIAGNOSIS — K449 Diaphragmatic hernia without obstruction or gangrene: Secondary | ICD-10-CM | POA: Diagnosis not present

## 2020-04-27 DIAGNOSIS — K449 Diaphragmatic hernia without obstruction or gangrene: Secondary | ICD-10-CM | POA: Diagnosis not present

## 2020-04-27 DIAGNOSIS — M47816 Spondylosis without myelopathy or radiculopathy, lumbar region: Secondary | ICD-10-CM | POA: Diagnosis not present

## 2020-04-27 DIAGNOSIS — M4800 Spinal stenosis, site unspecified: Secondary | ICD-10-CM | POA: Diagnosis not present

## 2020-04-27 DIAGNOSIS — M5116 Intervertebral disc disorders with radiculopathy, lumbar region: Secondary | ICD-10-CM | POA: Diagnosis not present

## 2020-04-27 DIAGNOSIS — K219 Gastro-esophageal reflux disease without esophagitis: Secondary | ICD-10-CM | POA: Diagnosis not present

## 2020-04-27 DIAGNOSIS — G2581 Restless legs syndrome: Secondary | ICD-10-CM | POA: Diagnosis not present

## 2020-04-27 DIAGNOSIS — E785 Hyperlipidemia, unspecified: Secondary | ICD-10-CM | POA: Diagnosis not present

## 2020-04-27 DIAGNOSIS — M1990 Unspecified osteoarthritis, unspecified site: Secondary | ICD-10-CM | POA: Diagnosis not present

## 2020-04-27 DIAGNOSIS — I10 Essential (primary) hypertension: Secondary | ICD-10-CM | POA: Diagnosis not present

## 2020-04-30 DIAGNOSIS — M47816 Spondylosis without myelopathy or radiculopathy, lumbar region: Secondary | ICD-10-CM | POA: Diagnosis not present

## 2020-04-30 DIAGNOSIS — M5116 Intervertebral disc disorders with radiculopathy, lumbar region: Secondary | ICD-10-CM | POA: Diagnosis not present

## 2020-04-30 DIAGNOSIS — K449 Diaphragmatic hernia without obstruction or gangrene: Secondary | ICD-10-CM | POA: Diagnosis not present

## 2020-04-30 DIAGNOSIS — M1990 Unspecified osteoarthritis, unspecified site: Secondary | ICD-10-CM | POA: Diagnosis not present

## 2020-04-30 DIAGNOSIS — M4800 Spinal stenosis, site unspecified: Secondary | ICD-10-CM | POA: Diagnosis not present

## 2020-04-30 DIAGNOSIS — K219 Gastro-esophageal reflux disease without esophagitis: Secondary | ICD-10-CM | POA: Diagnosis not present

## 2020-04-30 DIAGNOSIS — G2581 Restless legs syndrome: Secondary | ICD-10-CM | POA: Diagnosis not present

## 2020-04-30 DIAGNOSIS — I10 Essential (primary) hypertension: Secondary | ICD-10-CM | POA: Diagnosis not present

## 2020-04-30 DIAGNOSIS — E785 Hyperlipidemia, unspecified: Secondary | ICD-10-CM | POA: Diagnosis not present

## 2020-05-02 DIAGNOSIS — M5116 Intervertebral disc disorders with radiculopathy, lumbar region: Secondary | ICD-10-CM | POA: Diagnosis not present

## 2020-05-02 DIAGNOSIS — M1990 Unspecified osteoarthritis, unspecified site: Secondary | ICD-10-CM | POA: Diagnosis not present

## 2020-05-02 DIAGNOSIS — I10 Essential (primary) hypertension: Secondary | ICD-10-CM | POA: Diagnosis not present

## 2020-05-02 DIAGNOSIS — M47816 Spondylosis without myelopathy or radiculopathy, lumbar region: Secondary | ICD-10-CM | POA: Diagnosis not present

## 2020-05-02 DIAGNOSIS — E785 Hyperlipidemia, unspecified: Secondary | ICD-10-CM | POA: Diagnosis not present

## 2020-05-02 DIAGNOSIS — M4800 Spinal stenosis, site unspecified: Secondary | ICD-10-CM | POA: Diagnosis not present

## 2020-05-02 DIAGNOSIS — K219 Gastro-esophageal reflux disease without esophagitis: Secondary | ICD-10-CM | POA: Diagnosis not present

## 2020-05-02 DIAGNOSIS — K449 Diaphragmatic hernia without obstruction or gangrene: Secondary | ICD-10-CM | POA: Diagnosis not present

## 2020-05-02 DIAGNOSIS — G2581 Restless legs syndrome: Secondary | ICD-10-CM | POA: Diagnosis not present

## 2020-05-07 ENCOUNTER — Telehealth: Payer: Self-pay

## 2020-05-07 ENCOUNTER — Other Ambulatory Visit: Payer: Self-pay | Admitting: Family Medicine

## 2020-05-07 DIAGNOSIS — I10 Essential (primary) hypertension: Secondary | ICD-10-CM

## 2020-05-07 MED ORDER — VALSARTAN-HYDROCHLOROTHIAZIDE 80-12.5 MG PO TABS: 1.0000 | ORAL_TABLET | Freq: Every day | ORAL | 0 refills | Status: DC

## 2020-05-07 NOTE — Telephone Encounter (Signed)
See patient message, per telephone encounter note on 04/23/20 home health nurse called to report patient's BP had been elevated.  Patient's Maxide was discontinued and she was started on Diovan HCT instead.

## 2020-05-07 NOTE — Telephone Encounter (Signed)
Pt aware.

## 2020-05-07 NOTE — Telephone Encounter (Signed)
I sent in a lower strength. I need to see her back in the office in about 2 weeks.

## 2020-05-15 ENCOUNTER — Telehealth: Payer: Self-pay

## 2020-05-15 DIAGNOSIS — G894 Chronic pain syndrome: Secondary | ICD-10-CM | POA: Diagnosis not present

## 2020-05-15 DIAGNOSIS — M25572 Pain in left ankle and joints of left foot: Secondary | ICD-10-CM | POA: Diagnosis not present

## 2020-05-15 DIAGNOSIS — M5416 Radiculopathy, lumbar region: Secondary | ICD-10-CM | POA: Diagnosis not present

## 2020-05-15 DIAGNOSIS — G8929 Other chronic pain: Secondary | ICD-10-CM | POA: Diagnosis not present

## 2020-05-15 DIAGNOSIS — I10 Essential (primary) hypertension: Secondary | ICD-10-CM | POA: Diagnosis not present

## 2020-05-15 NOTE — Telephone Encounter (Signed)
Left message to call back  

## 2020-05-16 DIAGNOSIS — M5116 Intervertebral disc disorders with radiculopathy, lumbar region: Secondary | ICD-10-CM | POA: Diagnosis not present

## 2020-05-16 DIAGNOSIS — E785 Hyperlipidemia, unspecified: Secondary | ICD-10-CM | POA: Diagnosis not present

## 2020-05-16 DIAGNOSIS — K449 Diaphragmatic hernia without obstruction or gangrene: Secondary | ICD-10-CM | POA: Diagnosis not present

## 2020-05-16 DIAGNOSIS — I10 Essential (primary) hypertension: Secondary | ICD-10-CM | POA: Diagnosis not present

## 2020-05-16 DIAGNOSIS — M47816 Spondylosis without myelopathy or radiculopathy, lumbar region: Secondary | ICD-10-CM | POA: Diagnosis not present

## 2020-05-16 DIAGNOSIS — M4800 Spinal stenosis, site unspecified: Secondary | ICD-10-CM | POA: Diagnosis not present

## 2020-05-16 DIAGNOSIS — K219 Gastro-esophageal reflux disease without esophagitis: Secondary | ICD-10-CM | POA: Diagnosis not present

## 2020-05-16 DIAGNOSIS — M1990 Unspecified osteoarthritis, unspecified site: Secondary | ICD-10-CM | POA: Diagnosis not present

## 2020-05-16 DIAGNOSIS — G2581 Restless legs syndrome: Secondary | ICD-10-CM | POA: Diagnosis not present

## 2020-05-16 NOTE — Telephone Encounter (Signed)
Pt returning call

## 2020-05-16 NOTE — Telephone Encounter (Signed)
She was only supposed to be taking the 80/12.5, not the 160/25. If her BP is okay, though, she doesn't have to take either one.

## 2020-05-16 NOTE — Telephone Encounter (Signed)
Patient has had some episodes of low blood pressure, dizziness and not feeling well recently.  She has discontinued Valsartan Hcttz 160/25 and Valsartan Hctz 80/12.5.  She stopped these medications three days ago.  She has checked her blood pressure and it is normal. Patient has a follow up appointment with you next Tuesday.

## 2020-05-17 NOTE — Telephone Encounter (Signed)
Patient notified

## 2020-05-18 DIAGNOSIS — I10 Essential (primary) hypertension: Secondary | ICD-10-CM | POA: Diagnosis not present

## 2020-05-18 DIAGNOSIS — M5116 Intervertebral disc disorders with radiculopathy, lumbar region: Secondary | ICD-10-CM | POA: Diagnosis not present

## 2020-05-18 DIAGNOSIS — E785 Hyperlipidemia, unspecified: Secondary | ICD-10-CM | POA: Diagnosis not present

## 2020-05-18 DIAGNOSIS — G2581 Restless legs syndrome: Secondary | ICD-10-CM | POA: Diagnosis not present

## 2020-05-18 DIAGNOSIS — M4800 Spinal stenosis, site unspecified: Secondary | ICD-10-CM | POA: Diagnosis not present

## 2020-05-18 DIAGNOSIS — M47816 Spondylosis without myelopathy or radiculopathy, lumbar region: Secondary | ICD-10-CM | POA: Diagnosis not present

## 2020-05-18 DIAGNOSIS — K219 Gastro-esophageal reflux disease without esophagitis: Secondary | ICD-10-CM | POA: Diagnosis not present

## 2020-05-18 DIAGNOSIS — K449 Diaphragmatic hernia without obstruction or gangrene: Secondary | ICD-10-CM | POA: Diagnosis not present

## 2020-05-18 DIAGNOSIS — M1990 Unspecified osteoarthritis, unspecified site: Secondary | ICD-10-CM | POA: Diagnosis not present

## 2020-05-21 ENCOUNTER — Other Ambulatory Visit: Payer: Self-pay

## 2020-05-21 ENCOUNTER — Encounter: Payer: Self-pay | Admitting: Family Medicine

## 2020-05-21 ENCOUNTER — Ambulatory Visit (INDEPENDENT_AMBULATORY_CARE_PROVIDER_SITE_OTHER): Payer: Medicare HMO | Admitting: Family Medicine

## 2020-05-21 VITALS — BP 130/75 | HR 86 | Temp 98.0°F | Resp 20 | Ht 62.0 in | Wt 134.5 lb

## 2020-05-21 DIAGNOSIS — R630 Anorexia: Secondary | ICD-10-CM

## 2020-05-21 DIAGNOSIS — I952 Hypotension due to drugs: Secondary | ICD-10-CM | POA: Diagnosis not present

## 2020-05-21 DIAGNOSIS — I10 Essential (primary) hypertension: Secondary | ICD-10-CM | POA: Diagnosis not present

## 2020-05-21 MED ORDER — MEGESTROL ACETATE 400 MG/10ML PO SUSP: 200.0000 mg | Freq: Two times a day (BID) | ORAL | 1 refills | Status: DC

## 2020-05-21 NOTE — Patient Instructions (Signed)
Discontinue the cyclobenzaprine

## 2020-05-21 NOTE — Progress Notes (Signed)
Subjective:  Patient ID: Anita Perez, female    DOB: [birth year]  Age: 85 y.o. MRN: [id number]  CC: Two week follow up   HPI Anita Perez presents for follow-up of her hypertension.  She became concerned that her blood pressure was dropping too low.  Of note is that she felt dizzy and off-balance with the Diovan HCT.  Therefore it was decreased to 80/12.5.  The symptoms continued.  Therefore she discontinued the medication.  Now she feels well.  She continues to take the amlodipine 10 mg daily.  Her blood pressures measured at home since discontinuation of the valsartan are 136/72, 140/78, 134/76, 141/72.  Depression screen [facility] 2/9 03/15/2020 02/02/2020 01/11/2020  Decreased Interest 1 0 0  Down, Depressed, Hopeless 1 0 0  PHQ - 2 Score 2 0 0  Altered sleeping 0 - -  Tired, decreased energy 0 - -  Change in appetite 0 - -  Feeling bad or failure about yourself  0 - -  Trouble concentrating 0 - -  Moving slowly or fidgety/restless 0 - -  Suicidal thoughts 0 - -  PHQ-9 Score 2 - -    History Eevee has a past medical history of Arthritis, Colon polyps, DDD (degenerative disc disease), lumbar, Diverticulitis (1990s), GERD (gastroesophageal reflux disease), Hiatal hernia, Hyperlipidemia, Hypertension, Osteopenia, Rectal carcinoma ([place]), SBO (small bowel obstruction) ([place]) (04/25/2016), and Small bowel obstruction ([place]) (04/25/2016).   She has a past surgical history that includes intestinal  tear; Appendectomy; Cosmetic surgery (Left); Lumbar laminectomy/decompression microdiscectomy (Left, 06/14/2013); Knee arthroscopy (Right); Tumor removal (Left); Back surgery; Breast cyst excision (Left); Colostomy (1990s); Colostomy takedown (1990s); Colonoscopy w/ biopsies and polypectomy; and Colon surgery.   Her family history includes Cancer in her daughter, sister, and sister; Early death in her brother; Hypertension in her father and mother; Kidney disease in her father and mother.She reports  that she quit smoking about 37 years ago. Her smoking use included cigarettes. She has a 2.88 pack-year smoking history. She quit smokeless tobacco use about 74 years ago.  Her smokeless tobacco use included snuff. She reports previous alcohol use. She reports that she does not use drugs.    ROS Review of Systems  Constitutional: Positive for appetite change (poor).  HENT: Negative.   Eyes: Negative for visual disturbance.  Respiratory: Negative for shortness of breath.   Cardiovascular: Negative for chest pain.  Gastrointestinal: Negative for abdominal pain.  Musculoskeletal: Negative for arthralgias.  Neurological: Positive for dizziness and light-headedness.    Objective:  BP 130/75   Pulse 86   Temp 98 F (36.7 C) (Temporal)   Resp 20   Ht 5\' 2"  (1.575 m)   Wt 134 lb 8 oz (61 kg)   SpO2 98%   BMI 24.60 kg/m   BP Readings from Last 3 Encounters:  05/21/20 130/75  03/15/20 123/68  02/02/20 132/76    Wt Readings from Last 3 Encounters:  05/21/20 134 lb 8 oz (61 kg)  03/15/20 135 lb 9.6 oz (61.5 kg)  02/02/20 136 lb 2 oz (61.7 kg)     Physical Exam Constitutional:      General: She is not in acute distress.    Appearance: She is well-developed.  HENT:     Head: Normocephalic and atraumatic.  Eyes:     Conjunctiva/sclera: Conjunctivae normal.     Pupils: Pupils are equal, round, and reactive to light.  Neck:     Thyroid: No thyromegaly.  Cardiovascular:  Rate and Rhythm: Normal rate and regular rhythm.     Heart sounds: Normal heart sounds. No murmur heard.   Pulmonary:     Effort: Pulmonary effort is normal. No respiratory distress.     Breath sounds: Normal breath sounds. No wheezing or rales.  Abdominal:     General: Bowel sounds are normal. There is no distension.     Palpations: Abdomen is soft.     Tenderness: There is no abdominal tenderness.  Musculoskeletal:        General: Normal range of motion.     Cervical back: Normal range of motion  and neck supple.  Lymphadenopathy:     Cervical: No cervical adenopathy.  Skin:    General: Skin is warm and dry.  Neurological:     Mental Status: She is alert and oriented to person, place, and time.  Psychiatric:        Behavior: Behavior normal.        Thought Content: Thought content normal.        Judgment: Judgment normal.       Assessment & Plan:   Anita Perez was seen today for two week follow up.  Diagnoses and all orders for this visit:  Essential hypertension, benign  Hypotension due to medication  Loss of appetite for more than 2 weeks -     megestrol (MEGACE) 400 MG/10ML suspension; Take 5 mLs (200 mg total) by mouth 2 (two) times daily. For appetite stimulation       I have discontinued [employer]. Deupree's celecoxib, cyclobenzaprine, predniSONE, and valsartan-hydrochlorothiazide. I am also having her start on megestrol. Additionally, I am having her maintain her aspirin, Calcium Carbonate-Vitamin D (CALCIUM + D PO), Vitamin D, multivitamin, raloxifene, atorvastatin, pramipexole, amLODipine, pregabalin, HYDROcodone-acetaminophen, and polyethylene glycol powder.  Allergies as of 05/21/2020      Reactions   Diclofenac    SOB, nervous   Pepto-bismol [bismuth] Other (See Comments)   Turned mouth black inside   Ropinirole Other (See Comments)   Jittery      Medication List       Accurate as of May 21, 2020  6:17 PM. If you have any questions, ask your nurse or doctor.        STOP taking these medications   celecoxib 200 MG capsule Commonly known as: CeleBREX Stopped by: Claretta Fraise, MD   cyclobenzaprine 10 MG tablet Commonly known as: FLEXERIL Stopped by: Claretta Fraise, MD   predniSONE 20 MG tablet Commonly known as: DELTASONE Stopped by: Claretta Fraise, MD   valsartan-hydrochlorothiazide 80-12.5 MG tablet Commonly known as: Diovan HCT Stopped by: Claretta Fraise, MD     TAKE these medications   amLODipine 10 MG tablet Commonly known as:  NORVASC TAKE 1 TABLET EVERY DAY   aspirin 81 MG tablet Take 81 mg by mouth daily.   atorvastatin 80 MG tablet Commonly known as: LIPITOR Take 1 tablet (80 mg total) by mouth daily.   CALCIUM + D PO Take 600 mg by mouth daily.   HYDROcodone-acetaminophen 5-325 MG tablet Commonly known as: NORCO/VICODIN Take 0.5-1 tablets by mouth every 12 (twelve) hours as needed.   megestrol 400 MG/10ML suspension Commonly known as: MEGACE Take 5 mLs (200 mg total) by mouth 2 (two) times daily. For appetite stimulation Started by: Claretta Fraise, MD   multivitamin tablet Take 1 tablet by mouth daily.   polyethylene glycol powder 17 GM/SCOOP powder Commonly known as: GLYCOLAX/MIRALAX DISSOLVE 17 GRAMS IN 8 OZ OF FLUID LIQUID DRINK  DAILY AS DIRECTED   pramipexole 0.125 MG tablet Commonly known as: MIRAPEX Take 1 tablet (0.125 mg total) by mouth at bedtime. For leg and foot cramping What changed:   when to take this  reasons to take this   pregabalin 300 MG capsule Commonly known as: Lyrica Take 1 capsule (300 mg total) by mouth daily.   raloxifene 60 MG tablet Commonly known as: Evista Take 1 tablet (60 mg total) by mouth daily. For bone health and breast cancer prevention   Vitamin D 50 MCG (2000 UT) Caps Take 2,000 Units by mouth daily.        Follow-up: Return in about 3 months (around 08/18/2020).  Claretta Fraise, M.D.

## 2020-05-22 ENCOUNTER — Other Ambulatory Visit: Payer: Self-pay

## 2020-05-22 DIAGNOSIS — K219 Gastro-esophageal reflux disease without esophagitis: Secondary | ICD-10-CM | POA: Diagnosis not present

## 2020-05-22 DIAGNOSIS — G2581 Restless legs syndrome: Secondary | ICD-10-CM | POA: Diagnosis not present

## 2020-05-22 DIAGNOSIS — I10 Essential (primary) hypertension: Secondary | ICD-10-CM | POA: Diagnosis not present

## 2020-05-22 DIAGNOSIS — E785 Hyperlipidemia, unspecified: Secondary | ICD-10-CM | POA: Diagnosis not present

## 2020-05-22 DIAGNOSIS — M5116 Intervertebral disc disorders with radiculopathy, lumbar region: Secondary | ICD-10-CM | POA: Diagnosis not present

## 2020-05-22 DIAGNOSIS — K449 Diaphragmatic hernia without obstruction or gangrene: Secondary | ICD-10-CM | POA: Diagnosis not present

## 2020-05-22 DIAGNOSIS — M4800 Spinal stenosis, site unspecified: Secondary | ICD-10-CM | POA: Diagnosis not present

## 2020-05-22 DIAGNOSIS — M1990 Unspecified osteoarthritis, unspecified site: Secondary | ICD-10-CM | POA: Diagnosis not present

## 2020-05-22 DIAGNOSIS — M47816 Spondylosis without myelopathy or radiculopathy, lumbar region: Secondary | ICD-10-CM | POA: Diagnosis not present

## 2020-05-22 MED ORDER — PREGABALIN 300 MG PO CAPS: 300.0000 mg | ORAL_CAPSULE | Freq: Every day | ORAL | 1 refills | Status: DC

## 2020-05-22 NOTE — Telephone Encounter (Signed)
  Prescription Request  05/22/2020  What is the name of the medication or equipment? pregablin 300mg  capsules  Have you contacted your pharmacy to request a refill? (if applicable) yes  Which pharmacy would you like this sent to? [facility] mail order pt seen yesterday by Dr. Livia Snellen forgot to ask for refill   Patient notified that their request is being sent to the clinical staff for review and that they should receive a response within 2 business days.

## 2020-05-30 ENCOUNTER — Telehealth: Payer: Self-pay

## 2020-05-30 ENCOUNTER — Other Ambulatory Visit: Payer: Self-pay | Admitting: Family Medicine

## 2020-05-30 MED ORDER — PREGABALIN 200 MG PO CAPS: 200.0000 mg | ORAL_CAPSULE | Freq: Every day | ORAL | 1 refills | Status: DC

## 2020-05-30 NOTE — Telephone Encounter (Signed)
Reduce the dose for now. I sent in a scrip for 200 mg at bedtime.

## 2020-05-30 NOTE — Telephone Encounter (Signed)
Pt called to let Dr Livia Snellen know that she picked up her Pregabalin Rx yesterday and took it and says medicine has made her feel really dizzy.  Wants to know what Dr Livia Snellen wants her to do?  Please advise and call patient.

## 2020-05-31 NOTE — Telephone Encounter (Signed)
Patient aware.

## 2020-06-01 ENCOUNTER — Other Ambulatory Visit: Payer: Self-pay | Admitting: Family Medicine

## 2020-06-11 ENCOUNTER — Other Ambulatory Visit: Payer: Self-pay | Admitting: Family Medicine

## 2020-06-11 ENCOUNTER — Telehealth: Payer: Self-pay

## 2020-06-11 MED ORDER — PREGABALIN 50 MG PO CAPS: ORAL_CAPSULE | ORAL | 0 refills | Status: DC

## 2020-06-11 NOTE — Telephone Encounter (Signed)
Taper off. Go to 50 mg pill, 3 daily for 3 days then 2 daily for 3 days, then one daily for 3 days.

## 2020-06-11 NOTE — Telephone Encounter (Signed)
Pt aware.

## 2020-06-11 NOTE — Telephone Encounter (Signed)
Please send 50 mg dose change with instructions to pharmacy, she only has 200 mg

## 2020-06-11 NOTE — Telephone Encounter (Signed)
Please let the patient know that I sent their prescription to their pharmacy. Thanks, WS 

## 2020-06-11 NOTE — Telephone Encounter (Signed)
Please advise if patient can d/c medication.

## 2020-06-12 ENCOUNTER — Ambulatory Visit: Payer: Medicare HMO | Admitting: Family Medicine

## 2020-06-19 DIAGNOSIS — M25572 Pain in left ankle and joints of left foot: Secondary | ICD-10-CM | POA: Diagnosis not present

## 2020-06-19 DIAGNOSIS — M5416 Radiculopathy, lumbar region: Secondary | ICD-10-CM | POA: Diagnosis not present

## 2020-06-19 DIAGNOSIS — M961 Postlaminectomy syndrome, not elsewhere classified: Secondary | ICD-10-CM | POA: Diagnosis not present

## 2020-07-09 DIAGNOSIS — M5416 Radiculopathy, lumbar region: Secondary | ICD-10-CM | POA: Diagnosis not present

## 2020-07-09 DIAGNOSIS — M961 Postlaminectomy syndrome, not elsewhere classified: Secondary | ICD-10-CM | POA: Diagnosis not present

## 2020-07-12 ENCOUNTER — Telehealth: Payer: Self-pay

## 2020-07-12 DIAGNOSIS — M8580 Other specified disorders of bone density and structure, unspecified site: Secondary | ICD-10-CM

## 2020-07-12 MED ORDER — RALOXIFENE HCL 60 MG PO TABS: 60.0000 mg | ORAL_TABLET | Freq: Every day | ORAL | 0 refills | Status: DC

## 2020-07-12 MED ORDER — PRAMIPEXOLE DIHYDROCHLORIDE 0.125 MG PO TABS: 0.1250 mg | ORAL_TABLET | Freq: Every day | ORAL | 0 refills | Status: DC

## 2020-07-12 NOTE — Telephone Encounter (Signed)
Medication sent to pharmacy, patient aware.

## 2020-07-28 ENCOUNTER — Other Ambulatory Visit: Payer: Self-pay | Admitting: Family Medicine

## 2020-08-07 DIAGNOSIS — M5416 Radiculopathy, lumbar region: Secondary | ICD-10-CM | POA: Diagnosis not present

## 2020-08-07 DIAGNOSIS — M961 Postlaminectomy syndrome, not elsewhere classified: Secondary | ICD-10-CM | POA: Diagnosis not present

## 2020-08-07 DIAGNOSIS — M25572 Pain in left ankle and joints of left foot: Secondary | ICD-10-CM | POA: Diagnosis not present

## 2020-08-12 ENCOUNTER — Other Ambulatory Visit: Payer: Self-pay | Admitting: Family Medicine

## 2020-08-12 DIAGNOSIS — M8580 Other specified disorders of bone density and structure, unspecified site: Secondary | ICD-10-CM

## 2020-08-14 ENCOUNTER — Telehealth: Payer: Self-pay

## 2020-08-20 ENCOUNTER — Encounter: Payer: Self-pay | Admitting: Family Medicine

## 2020-08-20 ENCOUNTER — Other Ambulatory Visit: Payer: Self-pay

## 2020-08-20 ENCOUNTER — Ambulatory Visit (INDEPENDENT_AMBULATORY_CARE_PROVIDER_SITE_OTHER): Payer: Medicare HMO

## 2020-08-20 ENCOUNTER — Ambulatory Visit (INDEPENDENT_AMBULATORY_CARE_PROVIDER_SITE_OTHER): Payer: Medicare HMO | Admitting: Family Medicine

## 2020-08-20 VITALS — BP 133/70 | HR 93 | Temp 97.7°F | Ht 62.0 in | Wt 132.8 lb

## 2020-08-20 DIAGNOSIS — E785 Hyperlipidemia, unspecified: Secondary | ICD-10-CM

## 2020-08-20 DIAGNOSIS — M79605 Pain in left leg: Secondary | ICD-10-CM

## 2020-08-20 DIAGNOSIS — I10 Essential (primary) hypertension: Secondary | ICD-10-CM

## 2020-08-20 DIAGNOSIS — G2581 Restless legs syndrome: Secondary | ICD-10-CM | POA: Diagnosis not present

## 2020-08-20 MED ORDER — ATORVASTATIN CALCIUM 80 MG PO TABS: 80.0000 mg | ORAL_TABLET | Freq: Every day | ORAL | 1 refills | Status: DC

## 2020-08-20 MED ORDER — AMLODIPINE BESYLATE 10 MG PO TABS: 1.0000 | ORAL_TABLET | Freq: Every day | ORAL | 1 refills | Status: DC

## 2020-08-20 NOTE — Progress Notes (Signed)
Subjective:  Patient ID: Anita Perez, female    DOB: 21-Dec-1935  Age: 85 y.o. MRN: [id number]  CC: Medical Management of Chronic Issues   HPI MARIJEAN MONTANYE presents for  follow-up of hypertension. Patient has no history of headache chest pain or shortness of breath or recent cough. Patient also denies symptoms of TIA such as focal numbness or weakness. Patient denies side effects from medication. States taking it regularly.   in for follow-up of elevated cholesterol. Doing well without complaints on current medication. Denies side effects of statin including myalgia and arthralgia and nausea. Currently no chest pain, shortness of breath or other cardiovascular related symptoms noted.    History Shekela has a past medical history of Arthritis, Colon polyps, DDD (degenerative disc disease), lumbar, Diverticulitis (1990s), GERD (gastroesophageal reflux disease), Hiatal hernia, Hyperlipidemia, Hypertension, Osteopenia, Rectal carcinoma ([place]), SBO (small bowel obstruction) ([place]) (04/25/2016), and Small bowel obstruction ([place]) (04/25/2016).   She has a past surgical history that includes intestinal  tear; Appendectomy; Cosmetic surgery (Left); Lumbar laminectomy/decompression microdiscectomy (Left, 06/14/2013); Knee arthroscopy (Right); Tumor removal (Left); Back surgery; Breast cyst excision (Left); Colostomy (1990s); Colostomy takedown (1990s); Colonoscopy w/ biopsies and polypectomy; and Colon surgery.   Her family history includes Cancer in her daughter, sister, and sister; Early death in her brother; Hypertension in her father and mother; Kidney disease in her father and mother.She reports that she quit smoking about 38 years ago. Her smoking use included cigarettes. She has a 2.88 pack-year smoking history. She quit smokeless tobacco use about 74 years ago.  Her smokeless tobacco use included snuff. She reports previous alcohol use. She reports that she does not use drugs.  Current  Outpatient Medications on File Prior to Visit  Medication Sig Dispense Refill  . aspirin 81 MG tablet Take 81 mg by mouth daily.    . Calcium Carbonate-Vitamin D (CALCIUM + D PO) Take 600 mg by mouth daily.    . Cholecalciferol (VITAMIN D) 2000 units CAPS Take 2,000 Units by mouth daily.    Marland Kitchen HYDROcodone-acetaminophen (NORCO/VICODIN) 5-325 MG tablet Take 0.5-1 tablets by mouth every 12 (twelve) hours as needed.    . megestrol (MEGACE) 400 MG/10ML suspension Take 5 mLs (200 mg total) by mouth 2 (two) times daily. For appetite stimulation 900 mL 1  . Multiple Vitamin (MULTIVITAMIN) tablet Take 1 tablet by mouth daily.    . polyethylene glycol powder (GLYCOLAX/MIRALAX) 17 GM/SCOOP powder DISSOLVE 17 GRAMS IN 8 OZ OF FLUID LIQUID DRINK DAILY AS DIRECTED 510 g 0  . pramipexole (MIRAPEX) 0.125 MG tablet TAKE 1 TABLET (0.125 MG TOTAL) BY MOUTH AT BEDTIME. FOR LEG AND FOOT CRAMPING 90 tablet 0  . raloxifene (EVISTA) 60 MG tablet TAKE 1 TABLET (60 MG TOTAL) BY MOUTH DAILY. FOR BONE HEALTH AND BREAST CANCER PREVENTION 90 tablet 0   No current facility-administered medications on file prior to visit.    ROS Review of Systems  Objective:  BP 133/70   Pulse 93   Temp 97.7 F (36.5 C)   Ht _0  (1.575 m)   Wt 132 lb 12.8 oz (60.2 kg)   SpO2 98%   BMI 24.29 kg/m   BP Readings from Last 3 Encounters:  08/20/20 133/70  05/21/20 130/75  03/15/20 123/68    Wt Readings from Last 3 Encounters:  08/20/20 132 lb 12.8 oz (60.2 kg)  05/21/20 134 lb 8 oz (61 kg)  03/15/20 135 lb 9.6 oz (61.5 kg)     Physical Exam  Assessment & Plan:   Zeah was seen today for medical management of chronic issues.  Diagnoses and all orders for this visit:  Hyperlipidemia with target LDL less than 100 -     CBC with Differential/Platelet -     CMP14+EGFR  Essential hypertension, benign -     amLODipine (NORVASC) 10 MG tablet; Take 1 tablet (10 mg total) by mouth daily. -     CBC with  Differential/Platelet -     CMP14+EGFR  Restless leg syndrome -     CBC with Differential/Platelet -     CMP14+EGFR  Left leg pain -     DG Tibia/Fibula Left; Future -     CBC with Differential/Platelet -     CMP14+EGFR  Other orders -     atorvastatin (LIPITOR) 80 MG tablet; Take 1 tablet (80 mg total) by mouth daily.   Allergies as of 08/20/2020      Reactions   Diclofenac    SOB, nervous   Lyrica [pregabalin]    Pepto-bismol [bismuth] Other (See Comments)   Turned mouth black inside   Ropinirole Other (See Comments)   Jittery      Medication List       Accurate as of Aug 20, 2020  3:14 PM. If you have any questions, ask your nurse or doctor.        STOP taking these medications   pregabalin 50 MG capsule Commonly known as: Lyrica Stopped by: Claretta Fraise, MD     TAKE these medications   amLODipine 10 MG tablet Commonly known as: NORVASC Take 1 tablet (10 mg total) by mouth daily.   aspirin 81 MG tablet Take 81 mg by mouth daily.   atorvastatin 80 MG tablet Commonly known as: LIPITOR Take 1 tablet (80 mg total) by mouth daily.   CALCIUM + D PO Take 600 mg by mouth daily.   HYDROcodone-acetaminophen 5-325 MG tablet Commonly known as: NORCO/VICODIN Take 0.5-1 tablets by mouth every 12 (twelve) hours as needed.   megestrol 400 MG/10ML suspension Commonly known as: MEGACE Take 5 mLs (200 mg total) by mouth 2 (two) times daily. For appetite stimulation   multivitamin tablet Take 1 tablet by mouth daily.   polyethylene glycol powder 17 GM/SCOOP powder Commonly known as: GLYCOLAX/MIRALAX DISSOLVE 17 GRAMS IN 8 OZ OF FLUID LIQUID DRINK DAILY AS DIRECTED   pramipexole 0.125 MG tablet Commonly known as: MIRAPEX TAKE 1 TABLET (0.125 MG TOTAL) BY MOUTH AT BEDTIME. FOR LEG AND FOOT CRAMPING   raloxifene 60 MG tablet Commonly known as: EVISTA TAKE 1 TABLET (60 MG TOTAL) BY MOUTH DAILY. FOR BONE HEALTH AND BREAST CANCER PREVENTION   Vitamin D 50 MCG  (2000 UT) Caps Take 2,000 Units by mouth daily.       Meds ordered this encounter  Medications  . amLODipine (NORVASC) 10 MG tablet    Sig: Take 1 tablet (10 mg total) by mouth daily.    Dispense:  90 tablet    Refill:  1  . atorvastatin (LIPITOR) 80 MG tablet    Sig: Take 1 tablet (80 mg total) by mouth daily.    Dispense:  90 tablet    Refill:  1      Follow-up: Return in about 3 months (around 11/20/2020) for hypertension.  Claretta Fraise, M.D.

## 2020-08-21 LAB — CBC WITH DIFFERENTIAL/PLATELET
Basophils Absolute: 0.1 10*3/uL (ref 0.0–0.2)
Basos: 1 %
EOS (ABSOLUTE): 0.1 10*3/uL (ref 0.0–0.4)
Eos: 1 %
Hematocrit: 32.7 % — ABNORMAL LOW (ref 34.0–46.6)
Hemoglobin: 10.6 g/dL — ABNORMAL LOW (ref 11.1–15.9)
Immature Grans (Abs): 0 10*3/uL (ref 0.0–0.1)
Immature Granulocytes: 0 %
Lymphocytes Absolute: 2.3 10*3/uL (ref 0.7–3.1)
Lymphs: 32 %
MCH: 28.1 pg (ref 26.6–33.0)
MCHC: 32.4 g/dL (ref 31.5–35.7)
MCV: 87 fL (ref 79–97)
Monocytes Absolute: 0.8 10*3/uL (ref 0.1–0.9)
Monocytes: 11 %
Neutrophils Absolute: 4.1 10*3/uL (ref 1.4–7.0)
Neutrophils: 55 %
Platelets: 281 10*3/uL (ref 150–450)
RBC: 3.77 x10E6/uL (ref 3.77–5.28)
RDW: 14.6 % (ref 11.7–15.4)
WBC: 7.3 10*3/uL (ref 3.4–10.8)

## 2020-08-21 LAB — CMP14+EGFR
ALT: 17 IU/L (ref 0–32)
AST: 17 IU/L (ref 0–40)
Albumin/Globulin Ratio: 1.7 (ref 1.2–2.2)
Albumin: 4 g/dL (ref 3.6–4.6)
Alkaline Phosphatase: 58 IU/L (ref 44–121)
BUN/Creatinine Ratio: 18 (ref 12–28)
BUN: 17 mg/dL (ref 8–27)
Bilirubin Total: 0.4 mg/dL (ref 0.0–1.2)
CO2: 23 mmol/L (ref 20–29)
Calcium: 8.7 mg/dL (ref 8.7–10.3)
Chloride: 107 mmol/L — ABNORMAL HIGH (ref 96–106)
Creatinine, Ser: 0.95 mg/dL (ref 0.57–1.00)
Globulin, Total: 2.3 g/dL (ref 1.5–4.5)
Glucose: 86 mg/dL (ref 65–99)
Potassium: 3.9 mmol/L (ref 3.5–5.2)
Sodium: 142 mmol/L (ref 134–144)
Total Protein: 6.3 g/dL (ref 6.0–8.5)
eGFR: 59 mL/min/{1.73_m2} — ABNORMAL LOW (ref >59)

## 2020-08-22 NOTE — Progress Notes (Signed)
Hello Kauri,  Your lab result is normal and/or stable.Some minor variations that are not significant are commonly marked abnormal, but do not represent any medical problem for you.  Best regards, Trestan Vahle, M.D.

## 2020-08-23 ENCOUNTER — Telehealth: Payer: Self-pay | Admitting: Family Medicine

## 2020-08-23 NOTE — Telephone Encounter (Signed)
Advised patient circulation would not be determined through xray would need ultrasound for that.

## 2020-09-13 ENCOUNTER — Telehealth: Payer: Self-pay | Admitting: Family Medicine

## 2020-09-14 NOTE — Telephone Encounter (Signed)
Pt. Needs to be seen for this. Thanks, WS 
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
## 2020-09-18 NOTE — Telephone Encounter (Signed)
Lmtcb to schedule an apt

## 2020-09-26 ENCOUNTER — Other Ambulatory Visit: Payer: Self-pay

## 2020-09-26 ENCOUNTER — Encounter: Payer: Self-pay | Admitting: Family Medicine

## 2020-09-26 ENCOUNTER — Ambulatory Visit (INDEPENDENT_AMBULATORY_CARE_PROVIDER_SITE_OTHER): Payer: Medicare HMO | Admitting: Family Medicine

## 2020-09-26 VITALS — BP 147/76 | HR 88 | Temp 97.7°F | Ht 62.0 in | Wt 129.6 lb

## 2020-09-26 DIAGNOSIS — G2581 Restless legs syndrome: Secondary | ICD-10-CM

## 2020-09-26 DIAGNOSIS — M8589 Other specified disorders of bone density and structure, multiple sites: Secondary | ICD-10-CM | POA: Diagnosis not present

## 2020-09-26 DIAGNOSIS — I10 Essential (primary) hypertension: Secondary | ICD-10-CM | POA: Diagnosis not present

## 2020-09-26 DIAGNOSIS — E559 Vitamin D deficiency, unspecified: Secondary | ICD-10-CM | POA: Diagnosis not present

## 2020-09-26 DIAGNOSIS — E785 Hyperlipidemia, unspecified: Secondary | ICD-10-CM
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
## 2020-10-11 DIAGNOSIS — M5416 Radiculopathy, lumbar region: Secondary | ICD-10-CM | POA: Diagnosis not present

## 2020-11-01 DIAGNOSIS — Z23 Encounter for immunization: Secondary | ICD-10-CM | POA: Diagnosis not present

## 2020-11-20 ENCOUNTER — Ambulatory Visit: Payer: Medicare HMO | Admitting: Family Medicine

## 2020-12-05 ENCOUNTER — Telehealth: Payer: Self-pay | Admitting: Family Medicine

## 2020-12-05 NOTE — Telephone Encounter (Signed)
Left message for patient to call back and schedule Medicare Annual Wellness Visit (AWV) by video or phone Last AWV 11/23/2019 Please schedule at any time with [place]. 45-minute appointment Any questions, please contact me at [phone]

## 2020-12-24 ENCOUNTER — Other Ambulatory Visit: Payer: Self-pay | Admitting: Family Medicine

## 2020-12-24 DIAGNOSIS — M8580 Other specified disorders of bone density and structure, unspecified site: Secondary | ICD-10-CM

## 2021-01-01 DIAGNOSIS — G8929 Other chronic pain: Secondary | ICD-10-CM | POA: Diagnosis not present

## 2021-01-01 DIAGNOSIS — M5416 Radiculopathy, lumbar region: Secondary | ICD-10-CM | POA: Diagnosis not present

## 2021-01-01 DIAGNOSIS — M961 Postlaminectomy syndrome, not elsewhere classified: Secondary | ICD-10-CM | POA: Diagnosis not present

## 2021-01-08 ENCOUNTER — Ambulatory Visit (INDEPENDENT_AMBULATORY_CARE_PROVIDER_SITE_OTHER): Payer: Medicare Other | Admitting: Nurse Practitioner

## 2021-01-08 ENCOUNTER — Encounter: Payer: Self-pay | Admitting: Nurse Practitioner

## 2021-01-08 ENCOUNTER — Other Ambulatory Visit: Payer: Self-pay

## 2021-01-08 VITALS — BP 139/69 | HR 82 | Temp 97.4°F | Ht 62.0 in | Wt 129.0 lb

## 2021-01-08 DIAGNOSIS — R42 Dizziness and giddiness: Secondary | ICD-10-CM

## 2021-01-08 DIAGNOSIS — Z23 Encounter for immunization: Secondary | ICD-10-CM

## 2021-01-08 DIAGNOSIS — H9312 Tinnitus, left ear: Secondary | ICD-10-CM | POA: Insufficient documentation

## 2021-01-08 MED ORDER — MECLIZINE HCL 12.5 MG PO TABS: 6.2500 mg | ORAL_TABLET | Freq: Three times a day (TID) | ORAL | 0 refills | Status: DC | PRN

## 2021-01-08 NOTE — Patient Instructions (Signed)
Tinnitus Tinnitus refers to hearing a sound when there is no actual source for that sound. This is often described as ringing in the ears. However, people with this condition may hear a variety of noises, in one ear or in both ears. The sounds of tinnitus can be soft, loud, or somewhere in between. Tinnitus can last for a few seconds or can be constant for days. It may go away without treatment and come back at various times. When tinnitus is constant or happens often, it can lead to other problems, such as trouble sleeping and trouble concentrating. Almost everyone experiences tinnitus at some point. Tinnitus is not the same as hearing loss. Tinnitus that is long-lasting (chronic) or comes back often (recurs) may require medical attention. What are the causes? The cause of tinnitus is often not known. In some cases, it can result from: Exposure to loud noises from machinery, music, or other sources. An object (foreign body) stuck in the ear. Earwax buildup. Drinking alcohol or caffeine. Taking certain medicines. Age-related hearing loss. It may also be caused by medical conditions such as: Ear or sinus infections. Heart diseases or high blood pressure. Allergies. Mnire's disease. Thyroid problems. Tumors. A weak, bulging blood vessel (aneurysm) near the ear. What increases the risk? The following factors may make you more likely to develop this condition: Exposure to loud noises. Age. Tinnitus is more likely in older individuals. Using alcohol or tobacco. What are the signs or symptoms? The main symptom of tinnitus is hearing a sound when there is no source for that sound. It may sound like: Buzzing. Sizzling. Ringing. Blowing air. Hissing. Whistling. Other sounds may include: Roaring. Running water. A musical note. Tapping. Humming. Symptoms may affect only one ear (unilateral) or both ears (bilateral). How is this diagnosed? Tinnitus is diagnosed based on your symptoms,  your medical history, and a physical exam. Your health care provider may do a thorough hearing test (audiologic exam) if your tinnitus: Is unilateral. Causes hearing difficulties. Lasts 6 months or longer. You may work with a health care provider who specializes in hearing disorders (audiologist). You may be asked questions about your symptoms and how they affect your daily life. You may have other tests done, such as: CT scan. MRI. An imaging test of how blood flows through your blood vessels (angiogram). How is this treated? Treating an underlying medical condition can sometimes make tinnitus go away. If your tinnitus continues, other treatments may include: Therapy and counseling to help you manage the stress of living with tinnitus. Sound generators to mask the tinnitus. These include: Tabletop sound machines that play relaxing sounds to help you fall asleep. Wearable devices that fit in your ear and play sounds or music. Acoustic neural stimulation. This involves using headphones to listen to music that contains an auditory signal. Over time, listening to this signal may change some pathways in your brain and make you less sensitive to tinnitus. This treatment is used for very severe cases when no other treatment is working. Using hearing aids or cochlear implants if your tinnitus is related to hearing loss. Hearing aids are worn in the outer ear. Cochlear implants are surgically placed in the inner ear. Follow these instructions at home: Managing symptoms   When possible, avoid being in loud places and being exposed to loud sounds. Wear hearing protection, such as earplugs, when you are exposed to loud noises. Use a white noise machine, a humidifier, or other devices to mask the sound of tinnitus. Practice techniques  for reducing stress, such as meditation, yoga, or deep breathing. Work with your health care provider if you need help with managing stress. Sleep with your head slightly  raised. This may reduce the impact of tinnitus. General instructions Do not use stimulants, such as nicotine, alcohol, or caffeine. Talk with your health care provider about other stimulants to avoid. Stimulants are substances that can make you feel alert and attentive by increasing certain activities in the body (such as heart rate and blood pressure). These substances may make tinnitus worse. Take over-the-counter and prescription medicines only as told by your health care provider. Try to get plenty of sleep each night. Keep all follow-up visits. This is important. Contact a health care provider if: Your tinnitus continues for 3 weeks or longer without stopping. You develop sudden hearing loss. Your symptoms get worse or do not get better with home care. You feel you are not able to manage the stress of living with tinnitus. Get help right away if: You develop tinnitus after a head injury. You have tinnitus along with any of the following: Dizziness. Nausea and vomiting. Loss of balance. Sudden, severe headache. Vision changes. Facial weakness or weakness of arms or legs. These symptoms may represent a serious problem that is an emergency. Do not wait to see if the symptoms will go away. Get medical help right away. Call your local emergency services (911 in the [place] Do not drive yourself to the hospital. Summary Tinnitus refers to hearing a sound when there is no actual source for that sound. This is often described as ringing in the ears. Symptoms may affect only one ear (unilateral) or both ears (bilateral). Use a white noise machine, a humidifier, or other devices to mask the sound of tinnitus. Do not use stimulants, such as nicotine, alcohol, or caffeine. These substances may make tinnitus worse. This information is not intended to replace advice given to you by your health care provider. Make sure you discuss any questions you have with your health care provider. Document  Revised: 02/20/2020 Document Reviewed: 02/20/2020 Elsevier Patient Education  2022 [employer].

## 2021-01-08 NOTE — Assessment & Plan Note (Signed)
Symptoms are new and not well controlled.  On assessment left ear has some fluid, advised patient to stop using sweet oil and over-the-counter medication since it has no therapeutic effect and may be worsening your symptoms.  Patient verbalized understanding.  Advised to use nondrowsy antihistamine to help dry out fluids.  Symptoms are associated with some dizziness.  6.25 mg meclizine ordered follow-up with worsening unresolved symptoms education provided printed handouts given.  Rx sent to pharmacy. Marland Kitchen
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
## 2021-01-17 DIAGNOSIS — M5416 Radiculopathy, lumbar region: Secondary | ICD-10-CM | POA: Diagnosis not present

## 2021-01-30 ENCOUNTER — Telehealth: Payer: Self-pay | Admitting: Family Medicine

## 2021-01-30 NOTE — Telephone Encounter (Signed)
Left message for patient to call back and schedule Medicare Annual Wellness Visit (AWV) to be completed by video or phone.   Last AWV: 11/23/2019  Please schedule at anytime with [facility] Advisor.  45 minute appointment  Any questions, please contact me at [phone]

## 2021-02-04 ENCOUNTER — Ambulatory Visit (INDEPENDENT_AMBULATORY_CARE_PROVIDER_SITE_OTHER): Payer: Medicare Other | Admitting: Family Medicine

## 2021-02-04 ENCOUNTER — Encounter: Payer: Self-pay | Admitting: Family Medicine

## 2021-02-04 ENCOUNTER — Other Ambulatory Visit: Payer: Self-pay

## 2021-02-04 VITALS — BP 161/81 | HR 88 | Temp 97.2°F | Ht 62.0 in | Wt 128.2 lb

## 2021-02-04 DIAGNOSIS — E559 Vitamin D deficiency, unspecified: Secondary | ICD-10-CM | POA: Diagnosis not present

## 2021-02-04 DIAGNOSIS — E785 Hyperlipidemia, unspecified: Secondary | ICD-10-CM

## 2021-02-04 DIAGNOSIS — M8580 Other specified disorders of bone density and structure, unspecified site: Secondary | ICD-10-CM

## 2021-02-04 DIAGNOSIS — I10 Essential (primary) hypertension: Secondary | ICD-10-CM

## 2021-02-04 MED ORDER — RALOXIFENE HCL 60 MG PO TABS: 60.0000 mg | ORAL_TABLET | Freq: Every day | ORAL | 2 refills | Status: DC

## 2021-02-04 MED ORDER — ATORVASTATIN CALCIUM 80 MG PO TABS: 80.0000 mg | ORAL_TABLET | Freq: Every day | ORAL | 3 refills | Status: DC

## 2021-02-04 MED ORDER — AMLODIPINE BESYLATE 10 MG PO TABS: 10.0000 mg | ORAL_TABLET | Freq: Every day | ORAL | 3 refills | Status: DC
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
## 2021-02-05 LAB — CBC WITH DIFFERENTIAL/PLATELET
Basophils Absolute: 0.1 10*3/uL (ref 0.0–0.2)
Basos: 1 %
EOS (ABSOLUTE): 0 10*3/uL (ref 0.0–0.4)
Eos: 0 %
Hematocrit: 35.9 % (ref 34.0–46.6)
Hemoglobin: 11.8 g/dL (ref 11.1–15.9)
Immature Grans (Abs): 0 10*3/uL (ref 0.0–0.1)
Immature Granulocytes: 0 %
Lymphocytes Absolute: 2.3 10*3/uL (ref 0.7–3.1)
Lymphs: 28 %
MCH: 28.2 pg (ref 26.6–33.0)
MCHC: 32.9 g/dL (ref 31.5–35.7)
MCV: 86 fL (ref 79–97)
Monocytes Absolute: 0.7 10*3/uL (ref 0.1–0.9)
Monocytes: 8 %
Neutrophils Absolute: 5.1 10*3/uL (ref 1.4–7.0)
Neutrophils: 63 %
Platelets: 220 10*3/uL (ref 150–450)
RBC: 4.18 x10E6/uL (ref 3.77–5.28)
RDW: 14 % (ref 11.7–15.4)
WBC: 8.2 10*3/uL (ref 3.4–10.8)

## 2021-02-05 LAB — LIPID PANEL
Chol/HDL Ratio: 3 ratio (ref 0.0–4.4)
Cholesterol, Total: 173 mg/dL (ref 100–199)
HDL: 58 mg/dL (ref >39)
LDL Chol Calc (NIH): 101 mg/dL — ABNORMAL HIGH (ref 0–99)
Triglycerides: 73 mg/dL (ref 0–149)
VLDL Cholesterol Cal: 14 mg/dL (ref 5–40)

## 2021-02-05 LAB — VITAMIN D 25 HYDROXY (VIT D DEFICIENCY, FRACTURES): Vit D, 25-Hydroxy: 51.9 ng/mL (ref 30.0–100.0)

## 2021-02-05 LAB — CMP14+EGFR
ALT: 10 IU/L (ref 0–32)
AST: 17 IU/L (ref 0–40)
Albumin/Globulin Ratio: 1.9 (ref 1.2–2.2)
Albumin: 4.2 g/dL (ref 3.6–4.6)
Alkaline Phosphatase: 70 IU/L (ref 44–121)
BUN/Creatinine Ratio: 18 (ref 12–28)
BUN: 19 mg/dL (ref 8–27)
Bilirubin Total: 0.6 mg/dL (ref 0.0–1.2)
CO2: 26 mmol/L (ref 20–29)
Calcium: 9.1 mg/dL (ref 8.7–10.3)
Chloride: 107 mmol/L — ABNORMAL HIGH (ref 96–106)
Creatinine, Ser: 1.06 mg/dL — ABNORMAL HIGH (ref 0.57–1.00)
Globulin, Total: 2.2 g/dL (ref 1.5–4.5)
Glucose: 89 mg/dL (ref 70–99)
Potassium: 4.2 mmol/L (ref 3.5–5.2)
Sodium: 146 mmol/L — ABNORMAL HIGH (ref 134–144)
Total Protein: 6.4 g/dL (ref 6.0–8.5)
eGFR: 52 mL/min/{1.73_m2} — ABNORMAL LOW (ref >59)

## 2021-02-28 DIAGNOSIS — M25579 Pain in unspecified ankle and joints of unspecified foot: Secondary | ICD-10-CM | POA: Insufficient documentation

## 2021-02-28 DIAGNOSIS — G8929 Other chronic pain: Secondary | ICD-10-CM | POA: Insufficient documentation

## 2021-03-27 ENCOUNTER — Other Ambulatory Visit: Payer: Self-pay | Admitting: Nurse Practitioner

## 2021-03-27 DIAGNOSIS — R42 Dizziness and giddiness: Secondary | ICD-10-CM

## 2021-03-27 NOTE — Telephone Encounter (Signed)
Last office visit 02/04/21 Last refill 01/08/21, #30, no refills

## 2021-03-28 DIAGNOSIS — I10 Essential (primary) hypertension: Secondary | ICD-10-CM | POA: Diagnosis not present

## 2021-03-28 DIAGNOSIS — G894 Chronic pain syndrome: Secondary | ICD-10-CM | POA: Diagnosis not present

## 2021-03-28 DIAGNOSIS — M5416 Radiculopathy, lumbar region: Secondary | ICD-10-CM | POA: Diagnosis not present

## 2021-03-28 DIAGNOSIS — M961 Postlaminectomy syndrome, not elsewhere classified: Secondary | ICD-10-CM | POA: Diagnosis not present

## 2021-04-29 DIAGNOSIS — M5416 Radiculopathy, lumbar region: Secondary | ICD-10-CM | POA: Diagnosis not present

## 2021-05-03 ENCOUNTER — Ambulatory Visit (INDEPENDENT_AMBULATORY_CARE_PROVIDER_SITE_OTHER): Payer: Medicare Other

## 2021-05-03 ENCOUNTER — Ambulatory Visit (INDEPENDENT_AMBULATORY_CARE_PROVIDER_SITE_OTHER): Payer: Medicare Other | Admitting: Nurse Practitioner

## 2021-05-03 ENCOUNTER — Encounter: Payer: Self-pay | Admitting: Nurse Practitioner

## 2021-05-03 VITALS — BP 170/73 | HR 88 | Temp 98.4°F | Ht 62.0 in | Wt 134.0 lb

## 2021-05-03 DIAGNOSIS — M1711 Unilateral primary osteoarthritis, right knee: Secondary | ICD-10-CM | POA: Diagnosis not present

## 2021-05-03 DIAGNOSIS — S8991XA Unspecified injury of right lower leg, initial encounter: Secondary | ICD-10-CM

## 2021-05-03 DIAGNOSIS — W19XXXA Unspecified fall, initial encounter: Secondary | ICD-10-CM | POA: Diagnosis not present

## 2021-05-03 DIAGNOSIS — M25561 Pain in right knee: Secondary | ICD-10-CM

## 2021-05-03 DIAGNOSIS — M25461 Effusion, right knee: Secondary | ICD-10-CM | POA: Diagnosis not present
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
## 2021-05-03 NOTE — Patient Instructions (Signed)
Acute Knee Pain, Adult °Many things can cause knee pain. Sometimes, knee pain is sudden (acute) and may be caused by damage, swelling, or irritation of the muscles and tissues that support your knee. °The pain often goes away on its own with time and rest. If the pain does not go away, tests may be done to find out what is causing the pain. °Follow these instructions at home: °If you have a knee sleeve or brace: ° °Wear the knee sleeve or brace as told by your doctor. Take it off only as told by your doctor. °Loosen it if your toes: °Tingle. °Become numb. °Turn cold and blue. °Keep it clean. °If the knee sleeve or brace is not waterproof: °Do not let it get wet. °Cover it with a watertight covering when you take a bath or shower. °Activity °Rest your knee. °Do not do things that cause pain or make pain worse. °Avoid activities where both feet leave the ground at the same time (high-impact activities). Examples are running, jumping rope, and doing jumping jacks. °Work with a physical therapist to make a safe exercise program, as told by your doctor. °Managing pain, stiffness, and swelling ° °If told, put ice on the knee. To do this: °If you have a removable knee sleeve or brace, take it off as told by your doctor. °Put ice in a plastic bag. °Place a towel between your skin and the bag. °Leave the ice on for 20 minutes, 2-3 times a day. °Take off the ice if your skin turns bright red. This is very important. If you cannot feel pain, heat, or cold, you have a greater risk of damage to the area. °If told, use an elastic bandage to put pressure (compression) on your injured knee. °Raise your knee above the level of your heart while you are sitting or lying down. °Sleep with a pillow under your knee. °General instructions °Take over-the-counter and prescription medicines only as told by your doctor. °Do not smoke or use any products that contain nicotine or tobacco. If you need help quitting, ask your doctor. °If you are  overweight, work with your doctor and a food expert (dietitian) to set goals to lose weight. Being overweight can make your knee hurt more. °Watch for any changes in your symptoms. °Keep all follow-up visits. °Contact a doctor if: °The knee pain does not stop. °The knee pain changes or gets worse. °You have a fever along with knee pain. °Your knee is red or feels warm when you touch it. °Your knee gives out or locks up. °Get help right away if: °Your knee swells, and the swelling gets worse. °You cannot move your knee. °You have very bad knee pain that does not get better with pain medicine. °Summary °Many things can cause knee pain. The pain often goes away on its own with time and rest. °Your doctor may do tests to find out the cause of the pain. °Watch for any changes in your symptoms. Relieve your pain with rest, medicines, light activity, and use of ice. °Get help right away if you cannot move your knee or your knee pain is very bad. °This information is not intended to replace advice given to you by your health care provider. Make sure you discuss any questions you have with your health care provider. °Document Revised: 08/31/2019 Document Reviewed: 08/31/2019 °Elsevier Patient Education © 2022 [employer] ° °

## 2021-05-07 ENCOUNTER — Telehealth: Payer: Self-pay | Admitting: Family Medicine

## 2021-05-07 NOTE — Telephone Encounter (Signed)
Please advise.  Patient seen on 05/03/21 for knee pain, x-ray done at that time.

## 2021-05-07 NOTE — Telephone Encounter (Signed)
Please call patient with xray results

## 2021-05-08 NOTE — Telephone Encounter (Signed)
Patient aware.

## 2021-05-14 ENCOUNTER — Other Ambulatory Visit: Payer: Self-pay | Admitting: Family Medicine

## 2021-05-14 DIAGNOSIS — I10 Essential (primary) hypertension: Secondary | ICD-10-CM

## 2021-05-15 ENCOUNTER — Other Ambulatory Visit: Payer: Self-pay

## 2021-05-15 DIAGNOSIS — R42 Dizziness and giddiness: Secondary | ICD-10-CM

## 2021-05-15 MED ORDER — MECLIZINE HCL 12.5 MG PO TABS: 6.2500 mg | ORAL_TABLET | Freq: Three times a day (TID) | ORAL | 0 refills | Status: DC | PRN

## 2021-05-20 ENCOUNTER — Telehealth: Payer: Self-pay | Admitting: Family Medicine

## 2021-05-20 DIAGNOSIS — R42 Dizziness and giddiness: Secondary | ICD-10-CM

## 2021-05-20 MED ORDER — MECLIZINE HCL 12.5 MG PO TABS: 6.2500 mg | ORAL_TABLET | Freq: Three times a day (TID) | ORAL | 0 refills | Status: DC | PRN

## 2021-05-20 NOTE — Telephone Encounter (Signed)
Rx sent to corrected pharmacy

## 2021-06-03 DIAGNOSIS — S8991XA Unspecified injury of right lower leg, initial encounter: Secondary | ICD-10-CM | POA: Insufficient documentation

## 2021-06-03 NOTE — Assessment & Plan Note (Addendum)
Patient is ambulatory after the fall ?

## 2021-06-21 ENCOUNTER — Ambulatory Visit (INDEPENDENT_AMBULATORY_CARE_PROVIDER_SITE_OTHER): Payer: Medicare Other | Admitting: Family Medicine

## 2021-06-21 ENCOUNTER — Encounter: Payer: Self-pay | Admitting: Family Medicine

## 2021-06-21 VITALS — BP 150/68 | HR 85 | Ht 62.0 in | Wt 133.0 lb

## 2021-06-21 DIAGNOSIS — M7061 Trochanteric bursitis, right hip: Secondary | ICD-10-CM

## 2021-06-21 MED ORDER — METHYLPREDNISOLONE ACETATE 80 MG/ML IJ SUSP
80.0000 mg | Freq: Once | INTRAMUSCULAR | Status: AC
  Administered 2021-06-21: 80 mg via INTRAMUSCULAR

## 2021-06-21 NOTE — Progress Notes (Signed)
? ?BP (!) 150/68   Pulse 85   Ht '5\' 2"'$  (1.575 m)   Wt 133 lb (60.3 kg)   SpO2 97%   BMI 24.33 kg/m?   ? ?Subjective:  ? ?Patient ID: Anita Perez, female    DOB: [birth year], 86 y.o.   MRN: [id number] ? ?HPI: ?Anita Perez is a 86 y.o. female presenting on 06/21/2021 for Hip Pain (Right. Requesting injection) ? ? ?HPI ?Right hip pain ?Patient is coming in complaining of right lateral hip pain that is been bothering her over the past couple days.  She denies any specific trauma or fall.  She did have a fall a little while ago that hurt her knee but her hip was not hurting then until this just barely happened.  She denies any numbness or weakness but just mainly has the pain in the hip.  She says it does hurt when she walks or when she lays on that side. ? ?Relevant past medical, surgical, family and social history reviewed and updated as indicated. Interim medical history since our last visit reviewed. ?Allergies and medications reviewed and updated. ? ?Review of Systems  ?Constitutional:  Negative for chills and fever.  ?Eyes:  Negative for redness and visual disturbance.  ?Respiratory:  Negative for chest tightness and shortness of breath.   ?Cardiovascular:  Negative for chest pain and leg swelling.  ?Musculoskeletal:  Positive for arthralgias and myalgias. Negative for back pain and gait problem.  ?Skin:  Negative for color change and rash.  ?Neurological:  Negative for dizziness, light-headedness and headaches.  ?Psychiatric/Behavioral:  Negative for agitation and behavioral problems.   ?All other systems reviewed and are negative. ? ?Per HPI unless specifically indicated above ? ? ?Allergies as of 06/21/2021   ? ?   Reactions  ? Diclofenac   ? SOB, nervous  ? Lyrica [pregabalin]   ? Pepto-bismol [bismuth] Other (See Comments)  ? Turned mouth black inside  ? Ropinirole Other (See Comments)  ? Jittery  ? ?  ? ?  ?Medication List  ?  ? ?  ? Accurate as of June 21, 2021 10:00 AM. If you have any questions,  ask your nurse or doctor.  ?  ?  ? ?  ? ?amLODipine 10 MG tablet ?Commonly known as: NORVASC ?TAKE 1 TABLET EVERY DAY ?  ?aspirin 81 MG tablet ?Take 81 mg by mouth daily. ?  ?atorvastatin 80 MG tablet ?Commonly known as: LIPITOR ?Take 1 tablet (80 mg total) by mouth daily. ?  ?CALCIUM + D PO ?Take 600 mg by mouth daily. ?  ?HYDROcodone-acetaminophen 5-325 MG tablet ?Commonly known as: NORCO/VICODIN ?Take 0.5-1 tablets by mouth every 12 (twelve) hours as needed. ?  ?meclizine 12.5 MG tablet ?Commonly known as: ANTIVERT ?Take 0.5 tablets (6.25 mg total) by mouth 3 (three) times daily as needed for dizziness. ?  ?megestrol 400 MG/10ML suspension ?Commonly known as: MEGACE ?Take 5 mLs (200 mg total) by mouth 2 (two) times daily. For appetite stimulation ?  ?multivitamin tablet ?Take 1 tablet by mouth daily. ?  ?polyethylene glycol powder 17 GM/SCOOP powder ?Commonly known as: GLYCOLAX/MIRALAX ?DISSOLVE 17 GRAMS IN 8 OZ OF FLUID LIQUID DRINK DAILY AS DIRECTED ?  ?pramipexole 0.125 MG tablet ?Commonly known as: MIRAPEX ?TAKE 1 TABLET (0.125 MG TOTAL) BY MOUTH AT BEDTIME. FOR LEG AND FOOT CRAMPING ?  ?raloxifene 60 MG tablet ?Commonly known as: EVISTA ?Take 1 tablet (60 mg total) by mouth daily. For bone health and breast cancer prevention ?  ?  Vitamin D 50 MCG (2000 UT) Caps ?Take 2,000 Units by mouth daily. ?  ? ?  ? ? ? ?Objective:  ? ?BP (!) 150/68   Pulse 85   Ht '5\' 2"'$  (1.575 m)   Wt 133 lb (60.3 kg)   SpO2 97%   BMI 24.33 kg/m?   ?Wt Readings from Last 3 Encounters:  ?06/21/21 133 lb (60.3 kg)  ?05/03/21 134 lb (60.8 kg)  ?02/04/21 128 lb 3.2 oz (58.2 kg)  ?  ?Physical Exam ?Vitals and nursing note reviewed.  ?Constitutional:   ?   Appearance: Normal appearance.  ?Musculoskeletal:  ?   Right hip: Tenderness and bony tenderness present.  ?     Legs: ? ?Neurological:  ?   Mental Status: She is alert.  ? ? ?Right trochanteric bursitis injection: Consent form signed. Risk factors of bleeding and infection discussed  with patient and patient is agreeable towards injection. Patient prepped with Betadine. Lateral approach towards injection used. Injected 80 mg of Depo-Medrol and 1 mL of 2% lidocaine. Patient tolerated procedure well and no side effects from noted. Minimal to no bleeding. Simple bandage applied after.  ? ?Assessment & Plan:  ? ?Problem List Items Addressed This Visit   ?None ?Visit Diagnoses   ? ? Greater trochanteric bursitis of right hip    -  Primary  ? Relevant Medications  ? methylPREDNISolone acetate (DEPO-MEDROL) injection 80 mg  ? ?  ?  ?Injection and gave a list of stretches for her ?Follow up plan: ?Return if symptoms worsen or fail to improve. ? ?Counseling provided for all of the vaccine components ?No orders of the defined types were placed in this encounter. ? ? ?Anita Pina, MD ?[place] ?06/21/2021, 10:00 AM ? ? ? ? ?

## 2021-07-30 ENCOUNTER — Other Ambulatory Visit: Payer: Self-pay | Admitting: Family Medicine

## 2021-07-30 DIAGNOSIS — M8580 Other specified disorders of bone density and structure, unspecified site: Secondary | ICD-10-CM

## 2021-07-31 DIAGNOSIS — M5416 Radiculopathy, lumbar region: Secondary | ICD-10-CM | POA: Diagnosis not present

## 2021-08-05 ENCOUNTER — Ambulatory Visit: Payer: Medicare Other | Admitting: Family Medicine

## 2021-08-14 ENCOUNTER — Encounter: Payer: Self-pay | Admitting: Family Medicine

## 2021-08-14 ENCOUNTER — Ambulatory Visit (INDEPENDENT_AMBULATORY_CARE_PROVIDER_SITE_OTHER): Payer: Medicare Other | Admitting: Family Medicine

## 2021-08-14 ENCOUNTER — Ambulatory Visit (INDEPENDENT_AMBULATORY_CARE_PROVIDER_SITE_OTHER): Payer: Medicare Other

## 2021-08-14 VITALS — BP 152/84 | HR 83 | Temp 99.4°F | Ht 62.0 in | Wt 133.0 lb

## 2021-08-14 DIAGNOSIS — Z23 Encounter for immunization: Secondary | ICD-10-CM | POA: Diagnosis not present

## 2021-08-14 DIAGNOSIS — Z780 Asymptomatic menopausal state: Secondary | ICD-10-CM

## 2021-08-14 DIAGNOSIS — I10 Essential (primary) hypertension: Secondary | ICD-10-CM

## 2021-08-14 DIAGNOSIS — G2581 Restless legs syndrome: Secondary | ICD-10-CM | POA: Diagnosis not present

## 2021-08-14 DIAGNOSIS — M8580 Other specified disorders of bone density and structure, unspecified site: Secondary | ICD-10-CM | POA: Diagnosis not present

## 2021-08-14 DIAGNOSIS — E785 Hyperlipidemia, unspecified: Secondary | ICD-10-CM | POA: Diagnosis not present

## 2021-08-14 MED ORDER — PRAMIPEXOLE DIHYDROCHLORIDE 0.25 MG PO TABS: 0.2500 mg | ORAL_TABLET | Freq: Every day | ORAL | 1 refills | Status: DC

## 2021-08-14 MED ORDER — RALOXIFENE HCL 60 MG PO TABS: ORAL_TABLET | ORAL | 2 refills | Status: DC

## 2021-08-14 NOTE — Addendum Note (Signed)
Addended by: Baldomero Lamy B on: 08/14/2021 03:45 PM ? ? Modules accepted: Orders ? ?
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
## 2021-08-15 LAB — CMP14+EGFR
ALT: 15 IU/L (ref 0–32)
AST: 20 IU/L (ref 0–40)
Albumin/Globulin Ratio: 1.5 (ref 1.2–2.2)
Albumin: 3.9 g/dL (ref 3.6–4.6)
Alkaline Phosphatase: 66 IU/L (ref 44–121)
BUN/Creatinine Ratio: 20 (ref 12–28)
BUN: 18 mg/dL (ref 8–27)
Bilirubin Total: 0.4 mg/dL (ref 0.0–1.2)
CO2: 24 mmol/L (ref 20–29)
Calcium: 9 mg/dL (ref 8.7–10.3)
Chloride: 106 mmol/L (ref 96–106)
Creatinine, Ser: 0.89 mg/dL (ref 0.57–1.00)
Globulin, Total: 2.6 g/dL (ref 1.5–4.5)
Glucose: 86 mg/dL (ref 70–99)
Potassium: 4.5 mmol/L (ref 3.5–5.2)
Sodium: 143 mmol/L (ref 134–144)
Total Protein: 6.5 g/dL (ref 6.0–8.5)
eGFR: 64 mL/min/{1.73_m2} (ref >59)

## 2021-08-15 LAB — CBC WITH DIFFERENTIAL/PLATELET
Basophils Absolute: 0.1 10*3/uL (ref 0.0–0.2)
Basos: 1 %
EOS (ABSOLUTE): 0.1 10*3/uL (ref 0.0–0.4)
Eos: 1 %
Hematocrit: 37 % (ref 34.0–46.6)
Hemoglobin: 11.8 g/dL (ref 11.1–15.9)
Immature Grans (Abs): 0 10*3/uL (ref 0.0–0.1)
Immature Granulocytes: 0 %
Lymphocytes Absolute: 2.6 10*3/uL (ref 0.7–3.1)
Lymphs: 28 %
MCH: 27.7 pg (ref 26.6–33.0)
MCHC: 31.9 g/dL (ref 31.5–35.7)
MCV: 87 fL (ref 79–97)
Monocytes Absolute: 0.9 10*3/uL (ref 0.1–0.9)
Monocytes: 10 %
Neutrophils Absolute: 5.6 10*3/uL (ref 1.4–7.0)
Neutrophils: 60 %
Platelets: 297 10*3/uL (ref 150–450)
RBC: 4.26 x10E6/uL (ref 3.77–5.28)
RDW: 14 % (ref 11.7–15.4)
WBC: 9.3 10*3/uL (ref 3.4–10.8)

## 2021-08-15 LAB — LIPID PANEL
Chol/HDL Ratio: 3.5 ratio (ref 0.0–4.4)
Cholesterol, Total: 207 mg/dL — ABNORMAL HIGH (ref 100–199)
HDL: 60 mg/dL (ref >39)
LDL Chol Calc (NIH): 131 mg/dL — ABNORMAL HIGH (ref 0–99)
Triglycerides: 92 mg/dL (ref 0–149)
VLDL Cholesterol Cal: 16 mg/dL (ref 5–40)

## 2021-08-16 DIAGNOSIS — Z780 Asymptomatic menopausal state: Secondary | ICD-10-CM | POA: Diagnosis not present

## 2021-08-16 DIAGNOSIS — M8589 Other specified disorders of bone density and structure, multiple sites: Secondary | ICD-10-CM | POA: Diagnosis not present

## 2021-08-18 NOTE — Progress Notes (Signed)
DEXA shows osteopenia. I recommend weekly fosamax. ?Nurse, if pt. Is agreeable, send in Fosamax 70 mg weekly, #13. ? ?Thanks, ?WS ?

## 2021-08-21 ENCOUNTER — Telehealth: Payer: Self-pay | Admitting: Family Medicine

## 2021-08-21 ENCOUNTER — Other Ambulatory Visit: Payer: Self-pay | Admitting: *Deleted

## 2021-08-21 DIAGNOSIS — M8580 Other specified disorders of bone density and structure, unspecified site: Secondary | ICD-10-CM

## 2021-08-21 MED ORDER — ALENDRONATE SODIUM 70 MG PO TABS: 70.0000 mg | ORAL_TABLET | ORAL | 3 refills | Status: DC

## 2021-08-21 NOTE — Telephone Encounter (Signed)
PLEASE DISREGARD MESSAGE ABOUT THE MIRAPEX. IT WAS INCREASED AS DISCUSSED AT VISIT.

## 2021-08-21 NOTE — Telephone Encounter (Signed)
FYI

## 2021-08-22 MED ORDER — PRAMIPEXOLE DIHYDROCHLORIDE 0.5 MG PO TABS: 0.5000 mg | ORAL_TABLET | Freq: Every day | ORAL | 3 refills | Status: DC

## 2021-10-07 DIAGNOSIS — M5416 Radiculopathy, lumbar region: Secondary | ICD-10-CM | POA: Diagnosis not present

## 2021-10-11 ENCOUNTER — Ambulatory Visit: Payer: Self-pay | Admitting: *Deleted

## 2021-10-11 NOTE — Chronic Care Management (AMB) (Signed)
  Chronic Care Management   Note  10/11/2021 Name: Anita Perez MRN: [id number] DOB: June 14, 1935   Patient has either met RN Care Management goals, is stable from [facility] Management perspective, or has not recently engaged with the RN Care Manager. I am removing RN Care Manager from Care Team and closing [place]. If patient is currently engaged with another CCM team member I will forward this encounter to inform them of my case closure. Patient may be eligible for re-engagement with RN Care Manager in the future if necessary and can discuss this with their PCP.  Chong Sicilian, BSN, RN-BC Embedded Chronic Care Manager [facility] Direct Dial: [phone]

## 2021-10-11 NOTE — Patient Instructions (Signed)
[name]  I have previously worked with you through the Chronic Care Management Program at [facility]. Due to program changes I am removing myself from your care team because you've either met our goals, your conditions are stable and no longer require care management, or we haven't engaged within the past 6 months. If you are currently active with another CCM Team Member, you will remain active with them unless they reach out to you with additional information. If you feel that you need RN Care Management services in the future, please talk with your primary care provider to discuss re-engagement with the RN Care Manager that will be assigned to [facility]. This does not affect your status as a patient at [facility].   Thank you for allowing me to participate in your your healthcare journey.  Chong Sicilian, BSN, RN-BC Embedded Chronic Care Manager [facility] [place] [facility] Direct Dial: [phone]

## 2021-10-30 ENCOUNTER — Ambulatory Visit (INDEPENDENT_AMBULATORY_CARE_PROVIDER_SITE_OTHER): Payer: Medicare Other | Admitting: Family Medicine

## 2021-10-30 ENCOUNTER — Encounter: Payer: Self-pay | Admitting: Family Medicine

## 2021-10-30 DIAGNOSIS — G2581 Restless legs syndrome: Secondary | ICD-10-CM

## 2021-10-30 MED ORDER — BETAMETHASONE SOD PHOS & ACET 6 (3-3) MG/ML IJ SUSP
6.0000 mg | Freq: Once | INTRAMUSCULAR | Status: AC
  Administered 2021-10-30: 6 mg via INTRAMUSCULAR

## 2021-10-30 MED ORDER — PRAMIPEXOLE DIHYDROCHLORIDE 0.75 MG PO TABS: 0.7500 mg | ORAL_TABLET | Freq: Every day | ORAL | 2 refills | Status: DC

## 2021-10-30 NOTE — Progress Notes (Signed)
Subjective:  Patient ID: Anita Perez, female    DOB: April 05, 1935  Age: 86 y.o. MRN: [id number]  CC: Leg Pain and Feet Pain   HPI Anita Perez presents for heaviness in legs, with pain in the feet. Pain in the toes. Onset was a week ago. Has had feet tingling in the morning.      10/30/2021    2:16 PM 08/14/2021    2:02 PM 08/14/2021    1:50 PM  Depression screen PHQ 2/9  Decreased Interest 0 1 0  Down, Depressed, Hopeless 0 0 0  PHQ - 2 Score 0 1 0  Altered sleeping  0   Tired, decreased energy  0   Change in appetite  0   Feeling bad or failure about yourself   0   Trouble concentrating  0   Moving slowly or fidgety/restless  0   Suicidal thoughts  0   PHQ-9 Score  1   Difficult doing work/chores  Not difficult at all     [name] has a past medical history of Arthritis, Colon polyps, DDD (degenerative disc disease), lumbar, Diverticulitis (1990s), GERD (gastroesophageal reflux disease), Hiatal hernia, Hyperlipidemia, Hypertension, Osteopenia, Rectal carcinoma ([place]), SBO (small bowel obstruction) ([place]) (04/25/2016), and Small bowel obstruction ([place]) (04/25/2016).   She has a past surgical history that includes intestinal  tear; Appendectomy; Cosmetic surgery (Left); Lumbar laminectomy/decompression microdiscectomy (Left, 06/14/2013); Knee arthroscopy (Right); Tumor removal (Left); Back surgery; Breast cyst excision (Left); Colostomy (1990s); Colostomy takedown (1990s); Colonoscopy w/ biopsies and polypectomy; and Colon surgery.   Her family history includes Cancer in her daughter, sister, and sister; Early death in her brother; Hypertension in her father and mother; Kidney disease in her father and mother.She reports that she quit smoking about 39 years ago. Her smoking use included cigarettes. She has a 2.88 pack-year smoking history. She quit smokeless tobacco use about 75 years ago.  Her smokeless tobacco use included snuff. She reports that she does not currently use  alcohol. She reports that she does not use drugs.    ROS Review of Systems  Objective:  BP (!) 163/77   Pulse 87   Temp 97.8 F (36.6 C)   Ht '5\' 2"'$  (1.575 m)   Wt 136 lb 6.4 oz (61.9 kg)   SpO2 96%   BMI 24.95 kg/m   BP Readings from Last 3 Encounters:  10/30/21 (!) 163/77  08/14/21 (!) 152/84  06/21/21 (!) 150/68    Wt Readings from Last 3 Encounters:  10/30/21 136 lb 6.4 oz (61.9 kg)  08/14/21 133 lb (60.3 kg)  06/21/21 133 lb (60.3 kg)     Physical Exam Constitutional:      General: She is not in acute distress.    Appearance: She is well-developed.  Cardiovascular:     Rate and Rhythm: Normal rate and regular rhythm.  Pulmonary:     Breath sounds: Normal breath sounds.  Musculoskeletal:        General: Tenderness (over dorsal forefoot and ball of foot, bilaterally) present. No deformity or signs of injury. Normal range of motion.     Right lower leg: No edema.     Left lower leg: No edema.  Skin:    General: Skin is warm and dry.     Findings: No erythema, lesion or rash.  Neurological:     Mental Status: She is alert and oriented to person, place, and time.       Assessment & Plan:  Anita Perez was seen today for leg pain and feet pain.  Diagnoses and all orders for this visit:  Restless leg syndrome -     pramipexole (MIRAPEX) 0.75 MG tablet; Take 1 tablet (0.75 mg total) by mouth at bedtime. For leg and foot cramping -     betamethasone acetate-betamethasone sodium phosphate (CELESTONE) injection 6 mg       I have changed Anita Perez. Anita Perez's pramipexole. I am also having her maintain her aspirin, Calcium Carbonate-Vitamin D (CALCIUM + D PO), Vitamin D, multivitamin, polyethylene glycol powder, atorvastatin, amLODipine, raloxifene, and alendronate. We will continue to administer betamethasone acetate-betamethasone sodium phosphate.  Allergies as of 10/30/2021       Reactions   Diclofenac    SOB, nervous   Lyrica [pregabalin]    Pepto-bismol  [bismuth] Other (See Comments)   Turned mouth black inside   Ropinirole Other (See Comments)   Jittery        Medication List        Accurate as of October 30, 2021  2:41 PM. If you have any questions, ask your nurse or doctor.          alendronate 70 MG tablet Commonly known as: FOSAMAX Take 1 tablet (70 mg total) by mouth every 7 (seven) days. Take with a full glass of water on an empty stomach.   amLODipine 10 MG tablet Commonly known as: NORVASC TAKE 1 TABLET EVERY DAY   aspirin 81 MG tablet Take 81 mg by mouth daily.   atorvastatin 80 MG tablet Commonly known as: LIPITOR Take 1 tablet (80 mg total) by mouth daily.   CALCIUM + D PO Take 600 mg by mouth daily.   multivitamin tablet Take 1 tablet by mouth daily.   polyethylene glycol powder 17 GM/SCOOP powder Commonly known as: GLYCOLAX/MIRALAX DISSOLVE 17 GRAMS IN 8 OZ OF FLUID LIQUID DRINK DAILY AS DIRECTED   pramipexole 0.75 MG tablet Commonly known as: MIRAPEX Take 1 tablet (0.75 mg total) by mouth at bedtime. For leg and foot cramping What changed:  medication strength how much to take Changed by: Claretta Fraise, MD   raloxifene 60 MG tablet Commonly known as: EVISTA TAKE 1 TABLET ONCE DAILY (FOR BONE HEALTH AND BREAST CANCER PREVENTION)   Vitamin D 50 MCG (2000 UT) Caps Take 2,000 Units by mouth daily.         Follow-up: Return in about 29 days (around 11/28/2021).  Claretta Fraise, M.D.

## 2021-10-31 LAB — CMP14+EGFR
ALT: 13 IU/L (ref 0–32)
AST: 19 IU/L (ref 0–40)
Albumin/Globulin Ratio: 1.8 (ref 1.2–2.2)
Albumin: 4.2 g/dL (ref 3.7–4.7)
Alkaline Phosphatase: 67 IU/L (ref 44–121)
BUN/Creatinine Ratio: 18 (ref 12–28)
BUN: 16 mg/dL (ref 8–27)
Bilirubin Total: 0.4 mg/dL (ref 0.0–1.2)
CO2: 23 mmol/L (ref 20–29)
Calcium: 8.8 mg/dL (ref 8.7–10.3)
Chloride: 105 mmol/L (ref 96–106)
Creatinine, Ser: 0.89 mg/dL (ref 0.57–1.00)
Globulin, Total: 2.3 g/dL (ref 1.5–4.5)
Glucose: 91 mg/dL (ref 70–99)
Potassium: 4.2 mmol/L (ref 3.5–5.2)
Sodium: 141 mmol/L (ref 134–144)
Total Protein: 6.5 g/dL (ref 6.0–8.5)
eGFR: 64 mL/min/{1.73_m2} (ref >59)

## 2021-10-31 LAB — CK: Total CK: 148 U/L (ref 26–161)

## 2021-10-31 NOTE — Progress Notes (Signed)
Hello Anita Perez,  Your lab result is normal and/or stable.Some minor variations that are not significant are commonly marked abnormal, but do not represent any medical problem for you.  Best regards, Djon Tith, M.D.

## 2021-11-06 ENCOUNTER — Other Ambulatory Visit: Payer: Self-pay | Admitting: Family Medicine

## 2021-11-06 DIAGNOSIS — I10 Essential (primary) hypertension: Secondary | ICD-10-CM

## 2021-11-19 ENCOUNTER — Ambulatory Visit: Payer: Medicare Other | Admitting: Family Medicine

## 2021-11-25 DIAGNOSIS — M5416 Radiculopathy, lumbar region: Secondary | ICD-10-CM | POA: Diagnosis not present

## 2021-12-10 ENCOUNTER — Encounter: Payer: Self-pay | Admitting: Family Medicine

## 2021-12-10 ENCOUNTER — Ambulatory Visit (INDEPENDENT_AMBULATORY_CARE_PROVIDER_SITE_OTHER): Payer: Medicare Other | Admitting: Family Medicine

## 2021-12-10 DIAGNOSIS — G2581 Restless legs syndrome: Secondary | ICD-10-CM | POA: Diagnosis not present

## 2021-12-10 MED ORDER — AMLODIPINE BESYLATE-VALSARTAN 10-160 MG PO TABS: 1.0000 | ORAL_TABLET | Freq: Every day | ORAL | 1 refills | Status: DC

## 2021-12-10 MED ORDER — PRAMIPEXOLE DIHYDROCHLORIDE 1 MG PO TABS: 1.0000 mg | ORAL_TABLET | Freq: Every day | ORAL | 2 refills | Status: DC

## 2021-12-10 NOTE — Progress Notes (Signed)
Subjective:  Patient ID: [name], female    DOB: [birth year]  Age: 86 y.o. MRN: [id number]  CC: Follow-up and RESTLESS LEG   HPI REAGANN DOLCE presents for restless leg. No pain at night. # minutes of sharp pain in the morning about 1/2 of days. Then pain free. Sometimes comes back. Anytime with walking feels like she is walking on rocks. It is very uncomfortable.   presents for  follow-up of hypertension. Patient has no history of headache chest pain or shortness of breath or recent cough. Patient also denies symptoms of TIA such as focal numbness or weakness. Patient denies side effects from medication. States taking it regularly.  144/82 this AM at home.      10/30/2021    2:16 PM 08/14/2021    2:02 PM 08/14/2021    1:50 PM  Depression screen PHQ 2/9  Decreased Interest 0 1 0  Down, Depressed, Hopeless 0 0 0  PHQ - 2 Score 0 1 0  Altered sleeping  0   Tired, decreased energy  0   Change in appetite  0   Feeling bad or failure about yourself   0   Trouble concentrating  0   Moving slowly or fidgety/restless  0   Suicidal thoughts  0   PHQ-9 Score  1   Difficult doing work/chores  Not difficult at all     History [place] has a past medical history of Arthritis, Colon polyps, DDD (degenerative disc disease), lumbar, Diverticulitis (1990s), GERD (gastroesophageal reflux disease), Hiatal hernia, Hyperlipidemia, Hypertension, Osteopenia, Rectal carcinoma ([place]), SBO (small bowel obstruction) ([place]) (04/25/2016), and Small bowel obstruction ([place]) (04/25/2016).   She has a past surgical history that includes intestinal  tear; Appendectomy; Cosmetic surgery (Left); Lumbar laminectomy/decompression microdiscectomy (Left, 06/14/2013); Knee arthroscopy (Right); Tumor removal (Left); Back surgery; Breast cyst excision (Left); Colostomy (1990s); Colostomy takedown (1990s); Colonoscopy w/ biopsies and polypectomy; and Colon surgery.   Her family history includes Cancer in her daughter,  sister, and sister; Early death in her brother; Hypertension in her father and mother; Kidney disease in her father and mother.She reports that she quit smoking about 39 years ago. Her smoking use included cigarettes. She has a 2.88 pack-year smoking history. She quit smokeless tobacco use about 75 years ago.  Her smokeless tobacco use included snuff. She reports that she does not currently use alcohol. She reports that she does not use drugs.    ROS Review of Systems  Constitutional: Negative.   HENT: Negative.    Eyes:  Negative for visual disturbance.  Respiratory:  Negative for shortness of breath.   Cardiovascular:  Negative for chest pain.  Gastrointestinal:  Negative for abdominal pain.  Musculoskeletal:  Negative for arthralgias.    Objective:  BP (!) 148/83   Pulse 78   Temp (!) 97.4 F (36.3 C)   Ht '5\' 2"'$  (1.575 m)   Wt 135 lb 12.8 oz (61.6 kg)   SpO2 97%   BMI 24.84 kg/m   BP Readings from Last 3 Encounters:  12/10/21 (!) 148/83  10/30/21 (!) 163/77  08/14/21 (!) 152/84    Wt Readings from Last 3 Encounters:  12/10/21 135 lb 12.8 oz (61.6 kg)  10/30/21 136 lb 6.4 oz (61.9 kg)  08/14/21 133 lb (60.3 kg)     Physical Exam Constitutional:      General: She is not in acute distress.    Appearance: She is well-developed.  Cardiovascular:     Rate and Rhythm:  Normal rate and regular rhythm.  Pulmonary:     Breath sounds: Normal breath sounds.  Musculoskeletal:        General: Normal range of motion.  Skin:    General: Skin is warm and dry.  Neurological:     Mental Status: She is alert and oriented to person, place, and time.       Assessment & Plan:   Lilou was seen today for follow-up and restless leg.  Diagnoses and all orders for this visit:  Restless leg syndrome -     pramipexole (MIRAPEX) 1 MG tablet; Take 1 tablet (1 mg total) by mouth at bedtime. For leg and foot cramping  Other orders -     amLODipine-valsartan (EXFORGE) 10-160 MG  tablet; Take 1 tablet by mouth daily. For blood pressure       I have discontinued [employer]. [place]'s amLODipine. I have also changed her pramipexole. Additionally, I am having her start on amLODipine-valsartan. Lastly, I am having her maintain her aspirin, Calcium Carbonate-Vitamin D (CALCIUM + D PO), Vitamin D, multivitamin, polyethylene glycol powder, atorvastatin, raloxifene, alendronate, Alpha-Lipoic Acid, and HYDROcodone-acetaminophen.  Allergies as of 12/10/2021       Reactions   Diclofenac    SOB, nervous   Lyrica [pregabalin]    Pepto-bismol [bismuth] Other (See Comments)   Turned mouth black inside   Ropinirole Other (See Comments)   Jittery        Medication List        Accurate as of December 10, 2021  5:15 PM. If you have any questions, ask your nurse or doctor.          STOP taking these medications    amLODipine 10 MG tablet Commonly known as: NORVASC Stopped by: Claretta Fraise, MD       TAKE these medications    alendronate 70 MG tablet Commonly known as: FOSAMAX Take 1 tablet (70 mg total) by mouth every 7 (seven) days. Take with a full glass of water on an empty stomach.   Alpha-Lipoic Acid 200 MG Tabs Take by mouth.   amLODipine-valsartan 10-160 MG tablet Commonly known as: Exforge Take 1 tablet by mouth daily. For blood pressure Started by: Claretta Fraise, MD   aspirin 81 MG tablet Take 81 mg by mouth daily.   atorvastatin 80 MG tablet Commonly known as: LIPITOR Take 1 tablet (80 mg total) by mouth daily.   CALCIUM + D PO Take 600 mg by mouth daily.   HYDROcodone-acetaminophen 5-325 MG tablet Commonly known as: NORCO/VICODIN Take 1 tablet by mouth 2 (two) times daily as needed.   multivitamin tablet Take 1 tablet by mouth daily.   polyethylene glycol powder 17 GM/SCOOP powder Commonly known as: GLYCOLAX/MIRALAX DISSOLVE 17 GRAMS IN 8 OZ OF FLUID LIQUID DRINK DAILY AS DIRECTED   pramipexole 1 MG tablet Commonly known as:  MIRAPEX Take 1 tablet (1 mg total) by mouth at bedtime. For leg and foot cramping What changed:  medication strength how much to take Changed by: Claretta Fraise, MD   raloxifene 60 MG tablet Commonly known as: EVISTA TAKE 1 TABLET ONCE DAILY (FOR BONE HEALTH AND BREAST CANCER PREVENTION)   Vitamin D 50 MCG (2000 UT) Caps Take 2,000 Units by mouth daily.         Follow-up: Return in about 1 month (around 01/09/2022).  Claretta Fraise, M.D.

## 2022-01-09 ENCOUNTER — Ambulatory Visit: Payer: Medicare Other | Admitting: Family Medicine

## 2022-01-09 ENCOUNTER — Encounter: Payer: Self-pay | Admitting: Family Medicine

## 2022-01-14 ENCOUNTER — Telehealth: Payer: Self-pay | Admitting: Family Medicine

## 2022-01-14 NOTE — Telephone Encounter (Signed)
Patient aware and it was the Amlodipine.

## 2022-01-14 NOTE — Telephone Encounter (Signed)
Okay. We will work on it next week. She will be fine until then. Please always get the name of the drug. Thanks

## 2022-01-22 ENCOUNTER — Encounter: Payer: Self-pay | Admitting: Family Medicine

## 2022-01-22 ENCOUNTER — Ambulatory Visit (INDEPENDENT_AMBULATORY_CARE_PROVIDER_SITE_OTHER): Payer: Medicare Other | Admitting: Family Medicine

## 2022-01-22 VITALS — BP 165/85 | HR 80 | Temp 97.8°F | Ht 62.0 in | Wt 136.6 lb

## 2022-01-22 DIAGNOSIS — Z23 Encounter for immunization: Secondary | ICD-10-CM | POA: Diagnosis not present

## 2022-01-22 DIAGNOSIS — M79671 Pain in right foot: Secondary | ICD-10-CM

## 2022-01-22 DIAGNOSIS — M79672 Pain in left foot: Secondary | ICD-10-CM

## 2022-01-22 DIAGNOSIS — I10 Essential (primary) hypertension: Secondary | ICD-10-CM | POA: Diagnosis not present

## 2022-01-22 DIAGNOSIS — G2581 Restless legs syndrome: Secondary | ICD-10-CM | POA: Diagnosis not present

## 2022-01-22 MED ORDER — PRAMIPEXOLE DIHYDROCHLORIDE 1.5 MG PO TABS
1.5000 mg | ORAL_TABLET | Freq: Every day | ORAL | 1 refills | Status: DC
Start: 2022-01-22 — End: 2023-02-16

## 2022-01-22 MED ORDER — AMLODIPINE BESYLATE 2.5 MG PO TABS: 2.5000 mg | ORAL_TABLET | Freq: Every day | ORAL | 3 refills | Status: DC

## 2022-01-22 NOTE — Progress Notes (Signed)
Subjective:  Patient ID: Anita Perez, female    DOB: 13-Dec-1935  Age: 86 y.o. MRN: [id number]  CC: Medical Management of Chronic Issues   HPI Anita Perez presents for follow up of high blood pressure. Exforge made her too dizzy. She DCed it after 2 days. BP has remained under 130/80 on her checks since DC of med. Legs are better. Still having burning in feet. Some tingling in the legs continues. No edema.       01/22/2022   11:40 AM 10/30/2021    2:16 PM 08/14/2021    2:02 PM  Depression screen PHQ 2/9  Decreased Interest 0 0 1  Down, Depressed, Hopeless 0 0 0  PHQ - 2 Score 0 0 1  Altered sleeping   0  Tired, decreased energy   0  Change in appetite   0  Feeling bad or failure about yourself    0  Trouble concentrating   0  Moving slowly or fidgety/restless   0  Suicidal thoughts   0  PHQ-9 Score   1  Difficult doing work/chores   Not difficult at all    [name] has a past medical history of Arthritis, Colon polyps, DDD (degenerative disc disease), lumbar, Diverticulitis (1990s), GERD (gastroesophageal reflux disease), Hiatal hernia, Hyperlipidemia, Hypertension, Osteopenia, Rectal carcinoma ([place]), SBO (small bowel obstruction) ([place]) (04/25/2016), and Small bowel obstruction ([place]) (04/25/2016).   She has a past surgical history that includes intestinal  tear; Appendectomy; Cosmetic surgery (Left); Lumbar laminectomy/decompression microdiscectomy (Left, 06/14/2013); Knee arthroscopy (Right); Tumor removal (Left); Back surgery; Breast cyst excision (Left); Colostomy (1990s); Colostomy takedown (1990s); Colonoscopy w/ biopsies and polypectomy; and Colon surgery.   Her family history includes Cancer in her daughter, sister, and sister; Early death in her brother; Hypertension in her father and mother; Kidney disease in her father and mother.She reports that she quit smoking about 39 years ago. Her smoking use included cigarettes. She has a 2.88 pack-year smoking history.  She quit smokeless tobacco use about 75 years ago.  Her smokeless tobacco use included snuff. She reports that she does not currently use alcohol. She reports that she does not use drugs.    ROS Review of Systems  Constitutional: Negative.   HENT: Negative.    Eyes:  Negative for visual disturbance.  Respiratory:  Negative for shortness of breath.   Cardiovascular:  Negative for chest pain.  Gastrointestinal:  Negative for abdominal pain.  Musculoskeletal:  Positive for arthralgias and myalgias.    Objective:  BP (!) 165/85   Pulse 80   Temp 97.8 F (36.6 C)   Ht '5\' 2"'$  (1.575 m)   Wt 136 lb 9.6 oz (62 kg)   SpO2 98%   BMI 24.98 kg/m   BP Readings from Last 3 Encounters:  01/22/22 (!) 165/85  12/10/21 (!) 148/83  10/30/21 (!) 163/77    Wt Readings from Last 3 Encounters:  01/22/22 136 lb 9.6 oz (62 kg)  12/10/21 135 lb 12.8 oz (61.6 kg)  10/30/21 136 lb 6.4 oz (61.9 kg)     Physical Exam Constitutional:      General: She is not in acute distress.    Appearance: She is well-developed.  Cardiovascular:     Rate and Rhythm: Normal rate and regular rhythm.  Pulmonary:     Breath sounds: Normal breath sounds.  Musculoskeletal:        General: No tenderness. Normal range of motion.  Skin:    General: Skin is  warm and dry.  Neurological:     Mental Status: She is alert and oriented to person, place, and time.       Assessment & Plan:   Anita Perez was seen today for medical management of chronic issues.  Diagnoses and all orders for this visit:  Restless leg syndrome -     pramipexole (MIRAPEX) 1.5 MG tablet; Take 1 tablet (1.5 mg total) by mouth at bedtime. For leg and foot cramping  Essential hypertension, benign  Bilateral foot pain -     Ambulatory referral to Podiatry  Need for shingles vaccine -     Zoster Recombinant (Shingrix )  Need for influenza vaccination -     Flu Vaccine QUAD High Dose(Fluad)  Other orders -     amLODipine (NORVASC) 2.5  MG tablet; Take 1 tablet (2.5 mg total) by mouth daily. For blood pressure       I have discontinued Anita Perez. Anita Perez's amLODipine-valsartan. I have also changed her pramipexole. Additionally, I am having her start on amLODipine. Lastly, I am having her maintain her aspirin, Calcium Carbonate-Vitamin D (CALCIUM + D PO), Vitamin D, multivitamin, polyethylene glycol powder, atorvastatin, raloxifene, alendronate, Alpha-Lipoic Acid, and HYDROcodone-acetaminophen.  Allergies as of 01/22/2022       Reactions   Diclofenac    SOB, nervous   Lyrica [pregabalin]    Pepto-bismol [bismuth] Other (See Comments)   Turned mouth black inside   Ropinirole Other (See Comments)   Jittery        Medication List        Accurate as of January 22, 2022  9:05 PM. If you have any questions, ask your nurse or doctor.          STOP taking these medications    amLODipine-valsartan 10-160 MG tablet Commonly known as: Exforge Stopped by: Claretta Fraise, MD       TAKE these medications    alendronate 70 MG tablet Commonly known as: FOSAMAX Take 1 tablet (70 mg total) by mouth every 7 (seven) days. Take with a full glass of water on an empty stomach.   Alpha-Lipoic Acid 200 MG Tabs Take by mouth.   amLODipine 2.5 MG tablet Commonly known as: NORVASC Take 1 tablet (2.5 mg total) by mouth daily. For blood pressure Started by: Claretta Fraise, MD   aspirin 81 MG tablet Take 81 mg by mouth daily.   atorvastatin 80 MG tablet Commonly known as: LIPITOR Take 1 tablet (80 mg total) by mouth daily.   CALCIUM + D PO Take 600 mg by mouth daily.   HYDROcodone-acetaminophen 5-325 MG tablet Commonly known as: NORCO/VICODIN Take 1 tablet by mouth 2 (two) times daily as needed.   multivitamin tablet Take 1 tablet by mouth daily.   polyethylene glycol powder 17 GM/SCOOP powder Commonly known as: GLYCOLAX/MIRALAX DISSOLVE 17 GRAMS IN 8 OZ OF FLUID LIQUID DRINK DAILY AS DIRECTED   pramipexole  1.5 MG tablet Commonly known as: MIRAPEX Take 1 tablet (1.5 mg total) by mouth at bedtime. For leg and foot cramping What changed:  medication strength how much to take Changed by: Claretta Fraise, MD   raloxifene 60 MG tablet Commonly known as: EVISTA TAKE 1 TABLET ONCE DAILY (FOR BONE HEALTH AND BREAST CANCER PREVENTION)   Vitamin D 50 MCG (2000 UT) Caps Take 2,000 Units by mouth daily.         Follow-up: No follow-ups on file.  Claretta Fraise, M.D.

## 2022-02-18 ENCOUNTER — Encounter: Payer: Self-pay | Admitting: Family

## 2022-02-18 ENCOUNTER — Ambulatory Visit (INDEPENDENT_AMBULATORY_CARE_PROVIDER_SITE_OTHER): Payer: Medicare Other | Admitting: Family

## 2022-02-18 VITALS — BP 159/86 | HR 79 | Temp 98.6°F | Ht 62.0 in | Wt 135.8 lb

## 2022-02-18 DIAGNOSIS — G629 Polyneuropathy, unspecified: Secondary | ICD-10-CM | POA: Diagnosis not present

## 2022-02-18 DIAGNOSIS — M5126 Other intervertebral disc displacement, lumbar region: Secondary | ICD-10-CM | POA: Diagnosis not present

## 2022-02-18 DIAGNOSIS — M5416 Radiculopathy, lumbar region: Secondary | ICD-10-CM

## 2022-02-18 MED ORDER — GABAPENTIN 100 MG PO CAPS: 100.0000 mg | ORAL_CAPSULE | Freq: Two times a day (BID) | ORAL | 1 refills | Status: DC | PRN

## 2022-02-18 NOTE — Progress Notes (Signed)
   Subjective:    Patient ID: Anita Perez, female    DOB: 1935-10-16, 86 y.o.   MRN: [id number]  Chief Complaint  Patient presents with   Leg Pain    LEFT    Foot Pain    BOTH FEET    Leg Pain   Foot Pain   Pt reports she has bilateral burning pain in feet that has been on going for 4 months.  She has hx of lumbar herniation with surgery 8 years ago. She is followed by [facility] for back pain and is given Norco.    Review of Systems  All other systems reviewed and are negative.      Objective:   Physical Exam Vitals reviewed.  Constitutional:      General: She is not in acute distress.    Appearance: She is well-developed.  HENT:     Head: Normocephalic and atraumatic.     Right Ear: Tympanic membrane normal.     Left Ear: Tympanic membrane normal.  Eyes:     Pupils: Pupils are equal, round, and reactive to light.  Neck:     Thyroid: No thyromegaly.  Cardiovascular:     Rate and Rhythm: Normal rate and regular rhythm.     Heart sounds: Normal heart sounds. No murmur heard. Pulmonary:     Effort: Pulmonary effort is normal. No respiratory distress.     Breath sounds: Normal breath sounds. No wheezing.  Abdominal:     General: Bowel sounds are normal. There is no distension.     Palpations: Abdomen is soft.     Tenderness: There is no abdominal tenderness.  Musculoskeletal:        General: No tenderness. Normal range of motion.     Cervical back: Normal range of motion and neck supple.     Comments: Pain in lumbar with flexion and extension   Skin:    General: Skin is warm and dry.  Neurological:     Mental Status: She is alert and oriented to person, place, and time.     Cranial Nerves: No cranial nerve deficit.     Gait: Gait abnormal (using cane).     Deep Tendon Reflexes: Reflexes are normal and symmetric.  Psychiatric:        Behavior: Behavior normal.        Thought Content: Thought content normal.        Judgment: Judgment  normal.      BP (!) 159/86   Pulse 79   Temp 98.6 F (37 C) (Temporal)   Ht '5\' 2"'$  (1.575 m)   Wt 135 lb 12.8 oz (61.6 kg)   BMI 24.84 kg/m       Assessment & Plan:   Anita Perez comes in today with chief complaint of Leg Pain (LEFT ) and Foot Pain (BOTH FEET)   Diagnosis and orders addressed:  1. Polyneuropathy - gabapentin (NEURONTIN) 100 MG capsule; Take 1 capsule (100 mg total) by mouth 2 (two) times daily as needed.  Dispense: 180 capsule; Refill: 1  2. Lumbar disc herniation  3. Lumbar radiculopathy   Start Gabapentin 100 mg BID Keep appt with PCP and pain clinic   Evelina Dun, FNP

## 2022-02-18 NOTE — Patient Instructions (Signed)
Neuropathic Pain Neuropathic pain is pain caused by damage to the nerves that are responsible for certain sensations in your body (sensory nerves). Neuropathic pain can make you more sensitive to pain. Even a minor sensation can feel very painful. This is usually a long-term (chronic) condition that can be difficult to treat. The type of pain differs from person to person. It may: Start suddenly (acute), or it may develop slowly and become chronic. Come and go as damaged nerves heal, or it may stay at the same level for years. Cause emotional distress, loss of sleep, and a lower quality of life. What are the causes? The most common cause of this condition is diabetes. Many other diseases and conditions can also cause neuropathic pain. Causes of neuropathic pain can be classified as: Toxic. This is caused by medicines and chemicals. The most common causes of toxic neuropathic pain is damage from medicines that kill cancer cells (chemotherapy) or alcohol abuse. Metabolic. This can be caused by: Diabetes. Lack of vitamins like B12. Traumatic. Any injury that cuts, crushes, or stretches a nerve can cause damage and pain. Compression-related. If a sensory nerve gets trapped or compressed for a long period of time, the blood supply to the nerve can be cut off. Vascular. Many blood vessel diseases can cause neuropathic pain by decreasing blood supply and oxygen to nerves. Autoimmune. This type of pain results from diseases in which the body's defense system (immune system) mistakenly attacks sensory nerves. Examples of autoimmune diseases that can cause neuropathic pain include lupus and multiple sclerosis. Infectious. Many types of viral infections can damage sensory nerves and cause pain. Shingles infection is a common cause of this type of pain. Inherited. Neuropathic pain can be a symptom of many diseases that are passed down through families (genetic). What increases the risk? You are more likely to  develop this condition if: You have diabetes. You smoke. You drink too much alcohol. You are taking certain medicines, including chemotherapy or medicines that treat immune system disorders. What are the signs or symptoms? The main symptom is pain. Neuropathic pain is often described as: Burning. Shock-like. Stinging. Hot or cold. Itching. How is this diagnosed? No single test can diagnose neuropathic pain. It is diagnosed based on: A physical exam and your symptoms. Your health care provider will ask you about your pain. You may be asked to use a pain scale to describe how bad your pain is. Tests. These may be done to see if you have a cause and location of any nerve damage. They include: Nerve conduction studies and electromyography to test how well nerve signals travel through your nerves and muscles (electrodiagnostic testing). Skin biopsy to evaluate for small fiber neuropathy. Imaging studies, such as: X-rays. CT scan. MRI. How is this treated? Treatment for neuropathic pain may change over time. You may need to try different treatment options or a combination of treatments. Some options include: Treating the underlying cause of the neuropathy, such as diabetes, kidney disease, or vitamin deficiencies. Stopping medicines that can cause neuropathy, such as chemotherapy. Medicine to relieve pain. Medicines may include: Prescription or over-the-counter pain medicine. Anti-seizure medicine. Antidepressant medicines. Pain-relieving patches or creams that are applied to painful areas of skin. A medicine to numb the area (local anesthetic), which can be injected as a nerve block. Transcutaneous nerve stimulation. This uses electrical currents to block painful nerve signals. The treatment is painless. Alternative treatments, such as: Acupuncture. Meditation. Massage. Occupational or physical therapy. Pain management programs. Counseling. Follow   these instructions at  home: Medicines  Take over-the-counter and prescription medicines only as told by your health care provider. Ask your health care provider if the medicine prescribed to you: Requires you to avoid driving or using machinery. Can cause constipation. You may need to take these actions to prevent or treat constipation: Drink enough fluid to keep your urine pale yellow. Take over-the-counter or prescription medicines. Eat foods that are high in fiber, such as beans, whole grains, and fresh fruits and vegetables. Limit foods that are high in fat and processed sugars, such as fried or sweet foods. Lifestyle  Have a good support system at home. Consider joining a chronic pain support group. Do not use any products that contain nicotine or tobacco. These products include cigarettes, chewing tobacco, and vaping devices, such as e-cigarettes. If you need help quitting, ask your health care provider. Do not drink alcohol. General instructions Learn as much as you can about your condition. Work closely with all your health care providers to find the treatment plan that works best for you. Ask your health care provider what activities are safe for you. Keep all follow-up visits. This is important. Contact a health care provider if: Your pain treatments are not working. You are having side effects from your medicines. You are struggling with tiredness (fatigue), mood changes, depression, or anxiety. Get help right away if: You have thoughts of hurting yourself. Get help right away if you feel like you may hurt yourself or others, or have thoughts about taking your own life. Go to your nearest emergency room or: Call 911. Call the National Suicide Prevention Lifeline at 1-800-273-8255 or 988. This is open 24 hours a day. Text the Crisis Text Line at 741741. Summary Neuropathic pain is pain caused by damage to the nerves that are responsible for certain sensations in your body (sensory  nerves). Neuropathic pain may come and go as damaged nerves heal, or it may stay at the same level for years. Neuropathic pain is usually a long-term condition that can be difficult to treat. Consider joining a chronic pain support group. This information is not intended to replace advice given to you by your health care provider. Make sure you discuss any questions you have with your health care provider. Document Revised: 11/12/2020 Document Reviewed: 11/12/2020 Elsevier Patient Education  2023 [employer]  

## 2022-03-07 DIAGNOSIS — M5416 Radiculopathy, lumbar region: Secondary | ICD-10-CM | POA: Diagnosis not present

## 2022-04-03 ENCOUNTER — Ambulatory Visit (INDEPENDENT_AMBULATORY_CARE_PROVIDER_SITE_OTHER): Payer: Medicare Other | Admitting: Nurse Practitioner

## 2022-04-03 ENCOUNTER — Encounter: Payer: Self-pay | Admitting: Nurse Practitioner

## 2022-04-03 VITALS — BP 148/70 | HR 76 | Temp 97.0°F | Resp 20 | Ht 62.0 in | Wt 135.0 lb

## 2022-04-03 DIAGNOSIS — M25561 Pain in right knee: Secondary | ICD-10-CM | POA: Diagnosis not present

## 2022-04-03 MED ORDER — METHYLPREDNISOLONE ACETATE 40 MG/ML IJ SUSP
40.0000 mg | Freq: Once | INTRAMUSCULAR | Status: AC
  Administered 2022-04-03: 40 mg via INTRAMUSCULAR

## 2022-04-03 MED ORDER — LIDOCAINE HCL 2 % IJ SOLN
1.0000 mL | Freq: Once | INTRAMUSCULAR | Status: AC
  Administered 2022-04-03: 20 mg

## 2022-04-03 NOTE — Patient Instructions (Signed)
Joint Steroid Injection A joint steroid injection is a procedure to relieve swelling and pain in a joint. Steroids are medicines that reduce inflammation. In this procedure, your health care provider uses a syringe and a needle to inject a steroid medicine into a painful and inflamed joint. A pain-relieving medicine (anesthetic) may be injected along with the steroid. In some cases, your health care provider may use an imaging technique such as ultrasound or fluoroscopy to guide the injection. Joints that are often treated with steroid injections include the knee, shoulder, hip, and spine. These injections may also be used in the elbow, ankle, and joints of the hands or feet. You may have joint steroid injections as part of your treatment for inflammation caused by: Gout. Rheumatoid arthritis. Advanced wear-and-tear arthritis (osteoarthritis). Tendinitis. Bursitis. Joint steroid injections may be repeated, but having them too often can damage a joint or the skin over the joint. You should not have joint steroid injections less than 6 weeks apart or more than four times a year. Tell a health care provider about: Any allergies you have. All medicines you are taking, including vitamins, herbs, eye drops, creams, and over-the-counter medicines. Any problems you or family members have had with anesthetic medicines. Any blood disorders you have. Any surgeries you have had. Any medical conditions you have. Whether you are pregnant or may be pregnant. What are the risks? Generally, this is a safe treatment. However, problems may occur, including: Infection. Bleeding. Allergic reactions to medicines. Damage to the joint or tissues around the joint. Thinning of skin or loss of skin color over the joint. Temporary flushing of the face or chest. Temporary increase in pain. Temporary increase in blood sugar. Failure to relieve inflammation or pain. What happens before the treatment? Medicines Ask  your health care provider about: Changing or stopping your regular medicines. This is especially important if you are taking diabetes medicines or blood thinners. Taking medicines such as aspirin and ibuprofen. These medicines can thin your blood. Do not take these medicines unless your health care provider tells you to take them. Taking over-the-counter medicines, vitamins, herbs, and supplements. General instructions You may have imaging tests of your joint. Ask your health care provider if you can drive yourself home after the procedure. What happens during the treatment?  Your health care provider will position you for the injection and locate the injection site over your joint. The skin over the joint will be cleaned with a germ-killing soap. Your health care provider may: Spray a numbing solution (topical anesthetic) over the injection site. Inject a local anesthetic under the skin above your joint. The needle will be placed through your skin into your joint. Your health care provider may use imaging to guide the needle to the right spot for the injection. If imaging is used, a special contrast dye may be injected to confirm that the needle is in the correct location. The steroid medicine will be injected into your joint. Anesthetic may be injected along with the steroid. This may be a medicine that relieves pain for a short time (short-acting anesthetic) or for a longer time (long-acting anesthetic). The needle will be removed, and an adhesive bandage (dressing) will be placed over the injection site. The procedure may vary among health care providers and hospitals. What can I expect after the treatment? You will be able to go home after the treatment. It is normal to feel slight flushing for a few days after the injection. After the treatment, it is   common to have an increase in joint pain after the anesthetic has worn off. This may happen about an hour after a short-acting anesthetic  or about 8 hours after a longer-acting anesthetic. You should begin to feel relief from joint pain and swelling after 24 to 48 hours. Contact your health care provider if you do not begin to feel relief after 2 days. Follow these instructions at home: Injection site care Leave the adhesive dressing over your injection site in place until your health care provider says you can remove it. Check your injection site every day for signs of infection. Check for: More redness, swelling, or pain. Fluid or blood. Warmth. Pus or a bad smell. Activity Return to your normal activities as told by your health care provider. Ask your health care provider what activities are safe for you. You may be asked to limit activities that put stress on the joint for a few days. Do joint exercises as told by your health care provider. Do not take baths, swim, or use a hot tub until your health care provider approves. Ask your health care provider if you may take showers. You may only be allowed to take sponge baths. Managing pain, stiffness, and swelling  If directed, put ice on the joint. To do this: Put ice in a plastic bag. Place a towel between your skin and the bag. Leave the ice on for 20 minutes, 2-3 times a day. Remove the ice if your skin turns bright red. This is very important. If you cannot feel pain, heat, or cold, you have a greater risk of damage to the area. Raise (elevate) your joint above the level of your heart when you are sitting or lying down. General instructions Take over-the-counter and prescription medicines only as told by your health care provider. Do not use any products that contain nicotine or tobacco, such as cigarettes, e-cigarettes, and chewing tobacco. These can delay joint healing. If you need help quitting, ask your health care provider. If you have diabetes, be aware that your blood sugar may be slightly elevated for several days after the injection. Keep all follow-up visits.  This is important. Contact a health care provider if you have: Chills or a fever. Any signs of infection at your injection site. Increased pain or swelling or no relief after 2 days. Summary A joint steroid injection is a treatment to relieve pain and swelling in a joint. Steroids are medicines that reduce inflammation. Your health care provider may add an anesthetic along with the steroid. You may have joint steroid injections as part of your arthritis treatment. Joint steroid injections may be repeated, but having them too often can damage a joint or the skin over the joint. Contact your health care provider if you have a fever, chills, or signs of infection, or if you get no relief from joint pain or swelling. This information is not intended to replace advice given to you by your health care provider. Make sure you discuss any questions you have with your health care provider. Document Revised: 08/26/2019 Document Reviewed: 08/26/2019 Elsevier Patient Education  2023 [employer]  

## 2022-04-03 NOTE — Progress Notes (Signed)
   Subjective:    Patient ID: Anita Perez, female    DOB: Feb 15, 1936, 87 y.o.   MRN: [id number]   Chief Complaint: bilateral leg pain and Knee Pain (Right knee/)   Knee Pain    Patient comes in c/o right knee pain. Started several month sago and has gotten worse over the last week. She says her legs hurt all the time and she is on hydrocodone for that. Her knee pain is worse with walking. Rates pain 7/10 currently. Pain meds help.    Review of Systems  Constitutional:  Negative for diaphoresis.  Eyes:  Negative for pain.  Respiratory:  Negative for shortness of breath.   Cardiovascular:  Negative for chest pain, palpitations and leg swelling.  Gastrointestinal:  Negative for abdominal pain.  Endocrine: Negative for polydipsia.  Musculoskeletal:  Positive for arthralgias (right knee).  Skin:  Negative for rash.  Neurological:  Negative for dizziness, weakness and headaches.  Hematological:  Does not bruise/bleed easily.  All other systems reviewed and are negative.      Objective:   Physical Exam Constitutional:      Appearance: Normal appearance.  Cardiovascular:     Rate and Rhythm: Normal rate.     Heart sounds: Normal heart sounds.  Pulmonary:     Effort: Pulmonary effort is normal.     Breath sounds: Normal breath sounds.  Musculoskeletal:     Comments: Right knee mild effusion FROM of right knee with crepitus on flexion and extension  Skin:    General: Skin is warm and dry.  Neurological:     General: No focal deficit present.     Mental Status: She is alert and oriented to person, place, and time.  Psychiatric:        Mood and Affect: Mood normal.        Behavior: Behavior normal.    BP (!) 162/71   Pulse 76   Temp (!) 97 F (36.1 C) (Temporal)   Resp 20   Ht '5\' 2"'$  (1.575 m)   Wt 135 lb (61.2 kg)   SpO2 96%   BMI 24.69 kg/m   Joint Injection/Arthrocentesis  Date/Time: 04/03/2022 10:22 AM  Performed by: Chevis Pretty, FNP Authorized  by: Hassell Done Mary-Margaret, FNP  Indications: pain  Body area: knee Joint: right knee Local anesthesia used: no  Anesthesia: Local anesthesia used: no  Sedation: Patient sedated: no  Preparation: Patient was prepped and draped in the usual sterile fashion. Needle size: 22 G Ultrasound guidance: no Approach: inferior Methylprednisolone amount: 40 mg Lidocaine 2% amount: 1 mL Patient tolerance: patient tolerated the procedure well with no immediate complications           Assessment & Plan:  Anita Perez in today with chief complaint of bilateral leg pain and Knee Pain (Right knee/)   1. Acute pain of right knee Ice Rest ' elevate when sitting - lidocaine (XYLOCAINE) 2 % (with pres) injection 20 mg - methylPREDNISolone acetate (DEPO-MEDROL) injection 40 mg    The above assessment and management plan was discussed with the patient. The patient verbalized understanding of and has agreed to the management plan. Patient is aware to call the clinic if symptoms persist or worsen. Patient is aware when to return to the clinic for a follow-up visit. Patient educated on when it is appropriate to go to the emergency department.   Mary-Margaret Hassell Done, FNP

## 2022-04-15 ENCOUNTER — Other Ambulatory Visit: Payer: Self-pay | Admitting: Neurosurgery

## 2022-04-15 DIAGNOSIS — M5416 Radiculopathy, lumbar region: Secondary | ICD-10-CM

## 2022-04-17 ENCOUNTER — Encounter: Payer: Self-pay | Admitting: Nurse Practitioner

## 2022-04-17 ENCOUNTER — Ambulatory Visit (INDEPENDENT_AMBULATORY_CARE_PROVIDER_SITE_OTHER): Payer: Medicare Other | Admitting: Nurse Practitioner

## 2022-04-17 VITALS — BP 138/76 | HR 86 | Temp 96.9°F | Resp 20 | Ht 62.0 in | Wt 132.0 lb

## 2022-04-17 DIAGNOSIS — G629 Polyneuropathy, unspecified: Secondary | ICD-10-CM

## 2022-04-17 MED ORDER — GABAPENTIN 300 MG PO CAPS: 300.0000 mg | ORAL_CAPSULE | Freq: Two times a day (BID) | ORAL | 1 refills | Status: DC

## 2022-04-17 NOTE — Progress Notes (Signed)
   Subjective:    Patient ID: KHAYLEE MCEVOY, female    DOB: 1935-07-06, 87 y.o.   MRN: [id number]  Chief Complaint: bilateral leg and feet pain   HPI Patient in c/o right lower leg pain. Has been going on for several months. Intermittent  also c/o bil foot pain. Rates foot pain 9/10. Walking on them makes it worse. She gets back injection every month. 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    Review of Systems  Constitutional:  Negative for diaphoresis.  Eyes:  Negative for pain.  Respiratory:  Negative for shortness of breath.   Cardiovascular:  Negative for chest pain, palpitations and leg swelling.  Gastrointestinal:  Negative for abdominal pain.  Endocrine: Negative for polydipsia.  Skin:  Negative for rash.  Neurological:  Negative for dizziness, weakness and headaches.  Hematological:  Does not bruise/bleed easily.  All other systems reviewed and are negative.      Objective:   Physical Exam Vitals and nursing note reviewed.  Constitutional:      General: She is not in acute distress.    Appearance: Normal appearance. She is well-developed.  Neck:     Vascular: No carotid bruit or JVD.  Cardiovascular:     Rate and Rhythm: Normal rate and regular rhythm.     Heart sounds: Normal heart sounds.  Pulmonary:     Effort: Pulmonary effort is normal. No respiratory distress.     Breath sounds: Normal breath sounds. No wheezing or rales.  Chest:     Chest wall: No tenderness.  Abdominal:     General: Bowel sounds are normal. There is no distension or abdominal bruit.     Palpations: Abdomen is soft. There is no hepatomegaly, splenomegaly, mass or pulsatile mass.     Tenderness: There is no abdominal tenderness.  Musculoskeletal:        General: Normal range of motion.     Cervical back: Normal range of motion and neck supple.  Lymphadenopathy:     Cervical: No cervical adenopathy.  Skin:    General: Skin is warm and dry.  Neurological:     Mental  Status: She is alert and oriented to person, place, and time.     Deep Tendon Reflexes: Reflexes are normal and symmetric.  Psychiatric:        Behavior: Behavior normal.        Thought Content: Thought content normal.        Judgment: Judgment normal.     BP 138/76   Pulse 86   Temp (!) 96.9 F (36.1 C) (Temporal)   Resp 20   Ht '5\' 2"'$  (1.575 m)   Wt 132 lb (59.9 kg)   SpO2 98%   BMI 24.14 kg/m        Assessment & Plan:    [name] in today with chief complaint of bilateral leg and feet pain   1. Polyneuropathy Increase neurontin from '100mg'$  BID to '300mg'$  BID Ice Rest RTOprn    The above assessment and management plan was discussed with the patient. The patient verbalized understanding of and has agreed to the management plan. Patient is aware to call the clinic if symptoms persist or worsen. Patient is aware when to return to the clinic for a follow-up visit. Patient educated on when it is appropriate to go to the emergency department.   Mary-Margaret Hassell Done, FNP

## 2022-04-17 NOTE — Patient Instructions (Signed)
Peripheral Neuropathy Peripheral neuropathy is a type of nerve damage. It affects nerves that carry signals between the spinal cord and the arms, legs, and the rest of the body (peripheral nerves). It does not affect nerves in the spinal cord or brain. In peripheral neuropathy, one nerve or a group of nerves may be damaged. Peripheral neuropathy is a broad category that includes many specific nerve disorders, like diabetic neuropathy, hereditary neuropathy, and carpal tunnel syndrome. What are the causes? This condition may be caused by: Certain diseases, such as: Diabetes. This is the most common cause of peripheral neuropathy. Autoimmune diseases, such as rheumatoid arthritis and systemic lupus erythematosus. Nerve diseases that are passed from parent to child (inherited). Kidney disease. Thyroid disease. Other causes may include: Nerve injury. Pressure or stress on a nerve that lasts a long time. Lack (deficiency) of B vitamins. This can result from alcoholism, poor diet, or a restricted diet. Infections. Some medicines, such as cancer medicines (chemotherapy). Poisonous (toxic) substances, such as lead and mercury. Too little blood flowing to the legs. In some cases, the cause of this condition is not known. What are the signs or symptoms? Symptoms of this condition depend on which of your nerves is damaged. Symptoms in the legs, hands, and arms can include: Loss of feeling (numbness) in the feet, hands, or both. Tingling in the feet, hands, or both. Burning pain. Very sensitive skin. Weakness. Not being able to move a part of the body (paralysis). Clumsiness or poor coordination. Muscle twitching. Loss of balance. Symptoms in other parts of the body can include: Not being able to control your bladder. Feeling dizzy. Sexual problems. How is this diagnosed? Diagnosing and finding the cause of peripheral neuropathy can be difficult. Your health care provider will take your  medical history and do a physical exam. A neurological exam will also be done. This involves checking things that are affected by your brain, spinal cord, and nerves (nervous system). For example, your health care provider will check your reflexes, how you move, and what you can feel. You may have other tests, such as: Blood tests. Electromyogram (EMG) and nerve conduction tests. These tests check nerve function and how well the nerves are controlling the muscles. Imaging tests, such as a CT scan or MRI, to rule out other causes of your symptoms. Removing a small piece of nerve to be examined in a lab (nerve biopsy). Removing and examining a small amount of the fluid that surrounds the brain and spinal cord (lumbar puncture). How is this treated? Treatment for this condition may involve: Treating the underlying cause of the neuropathy, such as diabetes, kidney disease, or vitamin deficiencies. Stopping medicines that can cause neuropathy, such as chemotherapy. Medicine to help relieve pain. Medicines may include: Prescription or over-the-counter pain medicine. Anti-seizure medicine. Antidepressants. Pain-relieving patches that are applied to painful areas of skin. Surgery to relieve pressure on a nerve or to destroy a nerve that is causing pain. Physical therapy to help improve movement and balance. Devices to help you move around (assistive devices). Follow these instructions at home: Medicines Take over-the-counter and prescription medicines only as told by your health care provider. Do not take any other medicines without first asking your health care provider. Ask your health care provider if the medicine prescribed to you requires you to avoid driving or using machinery. Lifestyle  Do not use any products that contain nicotine or tobacco. These products include cigarettes, chewing tobacco, and vaping devices, such as e-cigarettes. Smoking keeps  blood from reaching damaged nerves. If you  need help quitting, ask your health care provider. Avoid or limit alcohol. Too much alcohol can cause a vitamin B deficiency, and vitamin B is needed for healthy nerves. Eat a healthy diet. This includes: Eating foods that are high in fiber, such as beans, whole grains, and fresh fruits and vegetables. Limiting foods that are high in fat and processed sugars, such as fried or sweet foods. General instructions  If you have diabetes, work closely with your health care provider to keep your blood sugar under control. If you have numbness in your feet: Check every day for signs of injury or infection. Watch for redness, warmth, and swelling. Wear padded socks and comfortable shoes. These help protect your feet. Develop a good support system. Living with peripheral neuropathy can be stressful. Consider talking with a mental health specialist or joining a support group. Use assistive devices and attend physical therapy as told by your health care provider. This may include using a walker or a cane. Keep all follow-up visits. This is important. Where to find more information National Institute of Neurological Disorders: www.ninds.nih.gov Contact a health care provider if: You have new signs or symptoms of peripheral neuropathy. You are struggling emotionally from dealing with peripheral neuropathy. Your pain is not well controlled. Get help right away if: You have an injury or infection that is not healing normally. You develop new weakness in an arm or leg. You have fallen or do so frequently. Summary Peripheral neuropathy is when the nerves in the arms or legs are damaged, resulting in numbness, weakness, or pain. There are many causes of peripheral neuropathy, including diabetes, pinched nerves, vitamin deficiencies, autoimmune disease, and hereditary conditions. Diagnosing and finding the cause of peripheral neuropathy can be difficult. Your health care provider will take your medical  history, do a physical exam, and do tests, including blood tests and nerve function tests. Treatment involves treating the underlying cause of the neuropathy and taking medicines to help control pain. Physical therapy and assistive devices may also help. This information is not intended to replace advice given to you by your health care provider. Make sure you discuss any questions you have with your health care provider. Document Revised: 11/20/2020 Document Reviewed: 11/20/2020 Elsevier Patient Education  2023 Elsevier Inc.  

## 2022-04-23 ENCOUNTER — Ambulatory Visit: Payer: Medicare Other

## 2022-04-23 ENCOUNTER — Ambulatory Visit (INDEPENDENT_AMBULATORY_CARE_PROVIDER_SITE_OTHER): Payer: Medicare Other | Admitting: Podiatry

## 2022-04-23 DIAGNOSIS — M79671 Pain in right foot: Secondary | ICD-10-CM

## 2022-04-23 DIAGNOSIS — M7672 Peroneal tendinitis, left leg: Secondary | ICD-10-CM

## 2022-04-23 DIAGNOSIS — M7671 Peroneal tendinitis, right leg: Secondary | ICD-10-CM

## 2022-04-23 MED ORDER — MELOXICAM 15 MG PO TABS: 15.0000 mg | ORAL_TABLET | Freq: Every day | ORAL | 1 refills | Status: DC
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
## 2022-04-25 ENCOUNTER — Ambulatory Visit: Payer: Medicare Other | Admitting: Podiatry

## 2022-04-27 ENCOUNTER — Ambulatory Visit
Admission: RE | Admit: 2022-04-27 | Discharge: 2022-04-27 | Disposition: A | Discharge status: Home / Self Care | Payer: Medicare Other | Source: Ambulatory Visit | Attending: Neurosurgery | Admitting: Neurosurgery

## 2022-04-27 DIAGNOSIS — M545 Low back pain, unspecified: Secondary | ICD-10-CM | POA: Diagnosis not present

## 2022-04-27 DIAGNOSIS — M25551 Pain in right hip: Secondary | ICD-10-CM | POA: Diagnosis not present

## 2022-04-27 DIAGNOSIS — M5416 Radiculopathy, lumbar region: Secondary | ICD-10-CM

## 2022-04-27 DIAGNOSIS — M48061 Spinal stenosis, lumbar region without neurogenic claudication: Secondary | ICD-10-CM | POA: Diagnosis not present

## 2022-04-28 ENCOUNTER — Other Ambulatory Visit: Payer: Self-pay | Admitting: Podiatry

## 2022-04-28 DIAGNOSIS — M79671 Pain in right foot: Secondary | ICD-10-CM

## 2022-04-29 ENCOUNTER — Ambulatory Visit: Payer: Medicare Other | Admitting: Family Medicine

## 2022-04-30 ENCOUNTER — Telehealth: Payer: Self-pay | Admitting: Family Medicine

## 2022-04-30 NOTE — Telephone Encounter (Signed)
Please review and advise.

## 2022-05-01 NOTE — Telephone Encounter (Signed)
Gave caller the ordering providers telephone number to call for results

## 2022-05-01 NOTE — Telephone Encounter (Signed)
Referral placed, as requested WS 

## 2022-05-05 DIAGNOSIS — M7672 Peroneal tendinitis, left leg: Secondary | ICD-10-CM | POA: Diagnosis not present

## 2022-05-05 DIAGNOSIS — M7671 Peroneal tendinitis, right leg: Secondary | ICD-10-CM | POA: Diagnosis not present

## 2022-05-05 MED ORDER — BETAMETHASONE SOD PHOS & ACET 6 (3-3) MG/ML IJ SUSP
3.0000 mg | Freq: Once | INTRAMUSCULAR | Status: AC
  Administered 2022-05-05: 3 mg via INTRA_ARTICULAR

## 2022-05-08 DIAGNOSIS — G629 Polyneuropathy, unspecified: Secondary | ICD-10-CM | POA: Diagnosis not present

## 2022-05-08 DIAGNOSIS — M48061 Spinal stenosis, lumbar region without neurogenic claudication: Secondary | ICD-10-CM | POA: Diagnosis not present

## 2022-05-21 ENCOUNTER — Ambulatory Visit (INDEPENDENT_AMBULATORY_CARE_PROVIDER_SITE_OTHER): Payer: Medicare Other | Admitting: Podiatry

## 2022-05-21 DIAGNOSIS — R2681 Unsteadiness on feet: Secondary | ICD-10-CM | POA: Diagnosis not present

## 2022-05-21 DIAGNOSIS — M7752 Other enthesopathy of left foot: Secondary | ICD-10-CM

## 2022-05-21 DIAGNOSIS — M7672 Peroneal tendinitis, left leg: Secondary | ICD-10-CM

## 2022-05-21 DIAGNOSIS — M7671 Peroneal tendinitis, right leg: Secondary | ICD-10-CM

## 2022-05-21 DIAGNOSIS — M7751 Other enthesopathy of right foot: Secondary | ICD-10-CM
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
## 2022-05-29 DIAGNOSIS — M7751 Other enthesopathy of right foot: Secondary | ICD-10-CM

## 2022-05-29 DIAGNOSIS — M7752 Other enthesopathy of left foot: Secondary | ICD-10-CM

## 2022-05-29 DIAGNOSIS — R2681 Unsteadiness on feet: Secondary | ICD-10-CM | POA: Diagnosis not present

## 2022-05-29 MED ORDER — BETAMETHASONE SOD PHOS & ACET 6 (3-3) MG/ML IJ SUSP
3.0000 mg | Freq: Once | INTRAMUSCULAR | Status: AC
  Administered 2022-05-29: 3 mg via INTRA_ARTICULAR

## 2022-06-10 ENCOUNTER — Other Ambulatory Visit: Payer: Self-pay

## 2022-06-10 ENCOUNTER — Encounter: Payer: Self-pay | Admitting: Physical Therapy

## 2022-06-10 ENCOUNTER — Ambulatory Visit: Payer: Medicare Other | Attending: Podiatry | Admitting: Physical Therapy

## 2022-06-10 DIAGNOSIS — M79671 Pain in right foot: Secondary | ICD-10-CM | POA: Diagnosis not present

## 2022-06-10 DIAGNOSIS — M79672 Pain in left foot: Secondary | ICD-10-CM | POA: Insufficient documentation

## 2022-06-10 DIAGNOSIS — M7752 Other enthesopathy of left foot: Secondary | ICD-10-CM | POA: Insufficient documentation

## 2022-06-10 DIAGNOSIS — M7751 Other enthesopathy of right foot: Secondary | ICD-10-CM | POA: Insufficient documentation
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
## 2022-06-12 ENCOUNTER — Ambulatory Visit: Payer: Medicare Other | Admitting: *Deleted

## 2022-06-12 ENCOUNTER — Encounter: Payer: Self-pay | Admitting: *Deleted

## 2022-06-12 DIAGNOSIS — M79672 Pain in left foot: Secondary | ICD-10-CM | POA: Diagnosis not present

## 2022-06-12 DIAGNOSIS — M7751 Other enthesopathy of right foot: Secondary | ICD-10-CM | POA: Diagnosis not present

## 2022-06-12 DIAGNOSIS — M79671 Pain in right foot: Secondary | ICD-10-CM | POA: Diagnosis not present

## 2022-06-12 DIAGNOSIS — M7752 Other enthesopathy of left foot: Secondary | ICD-10-CM | POA: Diagnosis not present
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
## 2022-06-17 ENCOUNTER — Ambulatory Visit: Payer: Medicare Other | Admitting: Physical Therapy

## 2022-06-17 DIAGNOSIS — M79672 Pain in left foot: Secondary | ICD-10-CM

## 2022-06-17 DIAGNOSIS — M79671 Pain in right foot: Secondary | ICD-10-CM

## 2022-06-17 DIAGNOSIS — M7752 Other enthesopathy of left foot: Secondary | ICD-10-CM | POA: Diagnosis not present

## 2022-06-17 DIAGNOSIS — M7751 Other enthesopathy of right foot: Secondary | ICD-10-CM | POA: Diagnosis not present
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
## 2022-06-18 ENCOUNTER — Other Ambulatory Visit: Payer: Self-pay | Admitting: Podiatry

## 2022-06-19 ENCOUNTER — Encounter: Payer: 59 | Admitting: Physical Therapy

## 2022-06-20 ENCOUNTER — Encounter: Payer: Self-pay | Admitting: *Deleted

## 2022-06-20 ENCOUNTER — Ambulatory Visit: Payer: Medicare Other | Admitting: *Deleted

## 2022-06-20 DIAGNOSIS — M7751 Other enthesopathy of right foot: Secondary | ICD-10-CM | POA: Diagnosis not present

## 2022-06-20 DIAGNOSIS — M79672 Pain in left foot: Secondary | ICD-10-CM | POA: Diagnosis not present

## 2022-06-20 DIAGNOSIS — M79671 Pain in right foot: Secondary | ICD-10-CM

## 2022-06-20 DIAGNOSIS — M7752 Other enthesopathy of left foot: Secondary | ICD-10-CM | POA: Diagnosis not present
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
## 2022-06-24 ENCOUNTER — Ambulatory Visit: Payer: Medicare Other | Admitting: *Deleted

## 2022-06-24 DIAGNOSIS — M79672 Pain in left foot: Secondary | ICD-10-CM

## 2022-06-24 DIAGNOSIS — M7752 Other enthesopathy of left foot: Secondary | ICD-10-CM | POA: Diagnosis not present

## 2022-06-24 DIAGNOSIS — M79671 Pain in right foot: Secondary | ICD-10-CM

## 2022-06-24 DIAGNOSIS — M7751 Other enthesopathy of right foot: Secondary | ICD-10-CM | POA: Diagnosis not present
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
## 2022-06-26 ENCOUNTER — Encounter: Payer: Self-pay | Admitting: Family Medicine

## 2022-06-26 ENCOUNTER — Ambulatory Visit (INDEPENDENT_AMBULATORY_CARE_PROVIDER_SITE_OTHER): Payer: Medicare Other | Admitting: Family Medicine

## 2022-06-26 ENCOUNTER — Ambulatory Visit: Payer: Medicare Other

## 2022-06-26 VITALS — BP 174/97 | HR 82 | Temp 97.9°F | Ht 62.0 in | Wt 134.4 lb

## 2022-06-26 DIAGNOSIS — M7751 Other enthesopathy of right foot: Secondary | ICD-10-CM | POA: Diagnosis not present

## 2022-06-26 DIAGNOSIS — M5416 Radiculopathy, lumbar region: Secondary | ICD-10-CM

## 2022-06-26 DIAGNOSIS — M7752 Other enthesopathy of left foot: Secondary | ICD-10-CM | POA: Diagnosis not present

## 2022-06-26 DIAGNOSIS — M79672 Pain in left foot: Secondary | ICD-10-CM

## 2022-06-26 DIAGNOSIS — R1013 Epigastric pain: Secondary | ICD-10-CM

## 2022-06-26 DIAGNOSIS — M79671 Pain in right foot: Secondary | ICD-10-CM

## 2022-06-26 MED ORDER — PANTOPRAZOLE SODIUM 40 MG PO TBEC: 40.0000 mg | DELAYED_RELEASE_TABLET | Freq: Every day | ORAL | 1 refills | Status: DC
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
## 2022-07-01 ENCOUNTER — Other Ambulatory Visit: Payer: Self-pay | Admitting: *Deleted

## 2022-07-01 ENCOUNTER — Ambulatory Visit: Payer: Medicare Other | Attending: Podiatry | Admitting: Physical Therapy

## 2022-07-01 ENCOUNTER — Encounter: Payer: Self-pay | Admitting: Physical Therapy

## 2022-07-01 DIAGNOSIS — M79672 Pain in left foot: Secondary | ICD-10-CM

## 2022-07-01 DIAGNOSIS — M79671 Pain in right foot: Secondary | ICD-10-CM | POA: Diagnosis not present

## 2022-07-01 MED ORDER — PANTOPRAZOLE SODIUM 40 MG PO TBEC: 40.0000 mg | DELAYED_RELEASE_TABLET | Freq: Every day | ORAL | 1 refills | Status: DC
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
## 2022-07-03 ENCOUNTER — Ambulatory Visit: Payer: Medicare Other

## 2022-07-03 DIAGNOSIS — M79671 Pain in right foot: Secondary | ICD-10-CM

## 2022-07-03 DIAGNOSIS — M79672 Pain in left foot: Secondary | ICD-10-CM | POA: Diagnosis not present
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
## 2022-07-08 ENCOUNTER — Ambulatory Visit: Payer: Medicare Other

## 2022-07-08 DIAGNOSIS — M79671 Pain in right foot: Secondary | ICD-10-CM

## 2022-07-08 DIAGNOSIS — M79672 Pain in left foot: Secondary | ICD-10-CM | POA: Diagnosis not present
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
## 2022-07-09 ENCOUNTER — Other Ambulatory Visit: Payer: Self-pay | Admitting: Family Medicine

## 2022-07-10 ENCOUNTER — Ambulatory Visit: Payer: Medicare Other | Admitting: *Deleted

## 2022-07-10 ENCOUNTER — Encounter: Payer: Self-pay | Admitting: *Deleted

## 2022-07-10 DIAGNOSIS — M79672 Pain in left foot: Secondary | ICD-10-CM

## 2022-07-10 DIAGNOSIS — M79671 Pain in right foot: Secondary | ICD-10-CM | POA: Diagnosis not present
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
## 2022-07-11 ENCOUNTER — Other Ambulatory Visit: Payer: Self-pay | Admitting: Family Medicine

## 2022-07-11 ENCOUNTER — Other Ambulatory Visit: Payer: Self-pay | Admitting: *Deleted

## 2022-07-11 MED ORDER — ATORVASTATIN CALCIUM 80 MG PO TABS: 80.0000 mg | ORAL_TABLET | Freq: Every day | ORAL | 0 refills | Status: DC

## 2022-07-14 ENCOUNTER — Other Ambulatory Visit: Payer: Self-pay | Admitting: *Deleted

## 2022-07-14 MED ORDER — GABAPENTIN 300 MG PO CAPS: 300.0000 mg | ORAL_CAPSULE | Freq: Two times a day (BID) | ORAL | 0 refills | Status: DC

## 2022-07-22 ENCOUNTER — Encounter: Payer: Self-pay | Admitting: Family Medicine

## 2022-07-22 ENCOUNTER — Ambulatory Visit (INDEPENDENT_AMBULATORY_CARE_PROVIDER_SITE_OTHER): Payer: Medicare Other | Admitting: Family Medicine

## 2022-07-22 VITALS — BP 175/80 | HR 81 | Temp 97.8°F | Ht 62.0 in | Wt 137.0 lb

## 2022-07-22 DIAGNOSIS — M8580 Other specified disorders of bone density and structure, unspecified site: Secondary | ICD-10-CM

## 2022-07-22 DIAGNOSIS — I10 Essential (primary) hypertension: Secondary | ICD-10-CM

## 2022-07-22 DIAGNOSIS — M5416 Radiculopathy, lumbar region: Secondary | ICD-10-CM | POA: Diagnosis not present

## 2022-07-22 DIAGNOSIS — K2100 Gastro-esophageal reflux disease with esophagitis, without bleeding: Secondary | ICD-10-CM | POA: Diagnosis not present

## 2022-07-22 DIAGNOSIS — E785 Hyperlipidemia, unspecified: Secondary | ICD-10-CM

## 2022-07-22 LAB — URINALYSIS
Bilirubin, UA: NEGATIVE
Glucose, UA: NEGATIVE
Nitrite, UA: NEGATIVE
Protein,UA: NEGATIVE
RBC, UA: NEGATIVE
Specific Gravity, UA: 1.02 (ref 1.005–1.030)
Urobilinogen, Ur: 0.2 mg/dL (ref 0.2–1.0)
pH, UA: 7 (ref 5.0–7.5)

## 2022-07-22 MED ORDER — ALENDRONATE SODIUM 70 MG PO TABS
70.0000 mg | ORAL_TABLET | ORAL | 3 refills | Status: DC
Start: 2022-07-22 — End: 2022-11-24

## 2022-07-22 MED ORDER — RALOXIFENE HCL 60 MG PO TABS
ORAL_TABLET | ORAL | 2 refills | Status: DC
Start: 2022-07-22 — End: 2023-02-16

## 2022-07-22 MED ORDER — GABAPENTIN 300 MG PO CAPS: ORAL_CAPSULE | ORAL | 1 refills | Status: DC
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
## 2022-07-23 LAB — CBC WITH DIFFERENTIAL/PLATELET
Basophils Absolute: 0.1 10*3/uL (ref 0.0–0.2)
Basos: 1 %
EOS (ABSOLUTE): 0.2 10*3/uL (ref 0.0–0.4)
Eos: 3 %
Hematocrit: 33.3 % — ABNORMAL LOW (ref 34.0–46.6)
Hemoglobin: 10.7 g/dL — ABNORMAL LOW (ref 11.1–15.9)
Immature Grans (Abs): 0 10*3/uL (ref 0.0–0.1)
Immature Granulocytes: 0 %
Lymphocytes Absolute: 2 10*3/uL (ref 0.7–3.1)
Lymphs: 29 %
MCH: 27.2 pg (ref 26.6–33.0)
MCHC: 32.1 g/dL (ref 31.5–35.7)
MCV: 85 fL (ref 79–97)
Monocytes Absolute: 0.6 10*3/uL (ref 0.1–0.9)
Monocytes: 9 %
Neutrophils Absolute: 4 10*3/uL (ref 1.4–7.0)
Neutrophils: 58 %
Platelets: 283 10*3/uL (ref 150–450)
RBC: 3.94 x10E6/uL (ref 3.77–5.28)
RDW: 13.6 % (ref 11.7–15.4)
WBC: 6.8 10*3/uL (ref 3.4–10.8)

## 2022-07-23 LAB — LIPID PANEL
Chol/HDL Ratio: 2.9 ratio (ref 0.0–4.4)
Cholesterol, Total: 164 mg/dL (ref 100–199)
HDL: 56 mg/dL (ref >39)
LDL Chol Calc (NIH): 90 mg/dL (ref 0–99)
Triglycerides: 100 mg/dL (ref 0–149)
VLDL Cholesterol Cal: 18 mg/dL (ref 5–40)

## 2022-07-23 LAB — CMP14+EGFR
ALT: 18 IU/L (ref 0–32)
AST: 26 IU/L (ref 0–40)
Albumin/Globulin Ratio: 1.6 (ref 1.2–2.2)
Albumin: 3.9 g/dL (ref 3.7–4.7)
Alkaline Phosphatase: 62 IU/L (ref 44–121)
BUN/Creatinine Ratio: 19 (ref 12–28)
BUN: 15 mg/dL (ref 8–27)
Bilirubin Total: 0.3 mg/dL (ref 0.0–1.2)
CO2: 23 mmol/L (ref 20–29)
Calcium: 9.1 mg/dL (ref 8.7–10.3)
Chloride: 106 mmol/L (ref 96–106)
Creatinine, Ser: 0.8 mg/dL (ref 0.57–1.00)
Globulin, Total: 2.4 g/dL (ref 1.5–4.5)
Glucose: 86 mg/dL (ref 70–99)
Potassium: 4.1 mmol/L (ref 3.5–5.2)
Sodium: 142 mmol/L (ref 134–144)
Total Protein: 6.3 g/dL (ref 6.0–8.5)
eGFR: 72 mL/min/{1.73_m2} (ref >59)

## 2022-07-23 NOTE — Progress Notes (Signed)
Hello  Marykathleen,    Your lab result is normal and/or stable.Some minor variations that are not significant are commonly marked abnormal, but do not represent any medical problem for you.   Best regards,  Lebron Nauert, M.D.

## 2022-08-04 ENCOUNTER — Ambulatory Visit: Payer: Medicare Other | Attending: Podiatry | Admitting: Physical Therapy

## 2022-08-04 ENCOUNTER — Encounter: Payer: Self-pay | Admitting: Physical Therapy

## 2022-08-04 DIAGNOSIS — M79672 Pain in left foot: Secondary | ICD-10-CM | POA: Diagnosis not present

## 2022-08-04 DIAGNOSIS — M79671 Pain in right foot: Secondary | ICD-10-CM
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
## 2022-08-08 ENCOUNTER — Ambulatory Visit: Payer: Medicare Other

## 2022-08-08 DIAGNOSIS — M79671 Pain in right foot: Secondary | ICD-10-CM

## 2022-08-08 DIAGNOSIS — M79672 Pain in left foot: Secondary | ICD-10-CM | POA: Diagnosis not present
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
## 2022-08-14 ENCOUNTER — Ambulatory Visit (INDEPENDENT_AMBULATORY_CARE_PROVIDER_SITE_OTHER): Payer: Medicare Other | Admitting: Family Medicine

## 2022-08-14 ENCOUNTER — Encounter: Payer: Self-pay | Admitting: Family Medicine

## 2022-08-14 VITALS — BP 179/86 | HR 70 | Temp 98.2°F | Ht 62.0 in | Wt 134.6 lb

## 2022-08-14 DIAGNOSIS — M7061 Trochanteric bursitis, right hip: Secondary | ICD-10-CM | POA: Diagnosis not present

## 2022-08-14 DIAGNOSIS — M5416 Radiculopathy, lumbar region: Secondary | ICD-10-CM | POA: Diagnosis not present

## 2022-08-14 DIAGNOSIS — M79604 Pain in right leg: Secondary | ICD-10-CM | POA: Diagnosis not present

## 2022-08-14 MED ORDER — BETAMETHASONE SOD PHOS & ACET 6 (3-3) MG/ML IJ SUSP
6.0000 mg | Freq: Once | INTRAMUSCULAR | Status: AC
Start: 2022-08-14 — End: 2022-08-14
  Administered 2022-08-14: 6 mg via INTRAMUSCULAR
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
## 2022-08-19 ENCOUNTER — Telehealth: Payer: Self-pay | Admitting: *Deleted

## 2022-08-19 DIAGNOSIS — M5416 Radiculopathy, lumbar region: Secondary | ICD-10-CM | POA: Diagnosis not present

## 2022-08-19 NOTE — Telephone Encounter (Signed)
Needs to see pcp to discuss

## 2022-08-19 NOTE — Telephone Encounter (Signed)
Patient given appointment with PCP for next week, advised to keep log of BP and if readings return to normal and stay she will cancel appointment, if continue to be high as this morning needs to be seen ASAP. Patient reports her daughter just had brain surgery and she was worried and thinks that is why it was elevated.

## 2022-08-19 NOTE — Telephone Encounter (Signed)
Pt seen at [facility] and her BP was elevated and they wanted her to notify her PCP. Her readings were:213/102 and 217/123 and 204/83 and once she was home her BP was 166/74. Pt states she feels fine and will monitor her BP and advised if she develops any dizziness, lightheadedness, weakness to call back and pt voiced understanding.

## 2022-08-20 ENCOUNTER — Encounter: Payer: Self-pay | Admitting: Family Medicine

## 2022-08-27 ENCOUNTER — Ambulatory Visit: Payer: Medicare Other | Admitting: Family Medicine

## 2022-08-27 ENCOUNTER — Encounter: Payer: Self-pay | Admitting: Family Medicine

## 2022-09-10 DIAGNOSIS — M5416 Radiculopathy, lumbar region: Secondary | ICD-10-CM | POA: Diagnosis not present

## 2022-10-12 DIAGNOSIS — M1712 Unilateral primary osteoarthritis, left knee: Secondary | ICD-10-CM | POA: Diagnosis not present

## 2022-10-12 DIAGNOSIS — M1711 Unilateral primary osteoarthritis, right knee: Secondary | ICD-10-CM | POA: Diagnosis not present

## 2022-10-12 DIAGNOSIS — M160 Bilateral primary osteoarthritis of hip: Secondary | ICD-10-CM | POA: Diagnosis not present

## 2022-10-21 ENCOUNTER — Ambulatory Visit: Payer: Medicare Other

## 2022-10-21 ENCOUNTER — Other Ambulatory Visit: Payer: Medicare Other

## 2022-10-21 VITALS — BP 137/81 | HR 82

## 2022-10-21 DIAGNOSIS — Z0130 Encounter for examination of blood pressure without abnormal findings: Secondary | ICD-10-CM

## 2022-10-21 NOTE — Progress Notes (Signed)
Patient here today for blood pressure check.  Blood pressure was 137/81, pulse 82

## 2022-10-23 ENCOUNTER — Encounter: Payer: Self-pay | Admitting: Family Medicine

## 2022-10-23 ENCOUNTER — Ambulatory Visit (INDEPENDENT_AMBULATORY_CARE_PROVIDER_SITE_OTHER): Payer: Medicare Other | Admitting: Family Medicine

## 2022-10-23 VITALS — BP 176/82 | HR 88 | Temp 97.5°F | Ht 62.0 in | Wt 133.2 lb

## 2022-10-23 DIAGNOSIS — E785 Hyperlipidemia, unspecified: Secondary | ICD-10-CM | POA: Diagnosis not present

## 2022-10-23 DIAGNOSIS — I10 Essential (primary) hypertension: Secondary | ICD-10-CM | POA: Diagnosis not present

## 2022-10-23 DIAGNOSIS — M5416 Radiculopathy, lumbar region: Secondary | ICD-10-CM

## 2022-10-23 MED ORDER — AMLODIPINE BESYLATE 5 MG PO TABS: 5.0000 mg | ORAL_TABLET | Freq: Every day | ORAL | Status: DC

## 2022-10-23 MED ORDER — GABAPENTIN 300 MG PO CAPS: 600.0000 mg | ORAL_CAPSULE | Freq: Two times a day (BID) | ORAL | Status: DC

## 2022-10-23 MED ORDER — CELECOXIB 400 MG PO CAPS: 400.0000 mg | ORAL_CAPSULE | Freq: Every day | ORAL | 3 refills | Status: DC

## 2022-10-23 NOTE — Progress Notes (Signed)
Subjective:  Patient ID: Anita Perez, female    DOB: Nov 30, 1935  Age: 87 y.o. MRN: [id number]  CC: Medical Management of Chronic Issues   HPI Anita Perez presents for leg pain constant for 2 weeks in shins and feet. Needles. 10/10, unbearable   presents for  follow-up of hypertension. Patient has no history of headache chest pain or shortness of breath or recent cough. Patient also denies symptoms of TIA such as focal numbness or weakness. Patient denies side effects from medication. States taking it regularly.   in for follow-up of elevated cholesterol. Doing well without complaints on current medication. Denies side effects of statin including myalgia and arthralgia and nausea. Currently no chest pain, shortness of breath or other cardiovascular related symptoms noted.      10/23/2022    1:57 PM 10/23/2022    1:46 PM 10/23/2022    1:39 PM  Depression screen PHQ 2/9  Decreased Interest 1 1 0  Down, Depressed, Hopeless 0 0 0  PHQ - 2 Score 1 1 0  Altered sleeping 1 1   Tired, decreased energy 1 1   Change in appetite  0   Feeling bad or failure about yourself   0   Trouble concentrating  0   Moving slowly or fidgety/restless  0   Suicidal thoughts  0   PHQ-9 Score 3 3   Difficult doing work/chores  Somewhat difficult     [name] has a past medical history of Arthritis, Colon polyps, DDD (degenerative disc disease), lumbar, Diverticulitis (1990s), GERD (gastroesophageal reflux disease), Hiatal hernia, Hyperlipidemia, Hypertension, Osteopenia, Rectal carcinoma (HCC), SBO (small bowel obstruction) (HCC) (04/25/2016), and Small bowel obstruction (HCC) (04/25/2016).   She has a past surgical history that includes intestinal  tear; Appendectomy; Cosmetic surgery (Left); Lumbar laminectomy/decompression microdiscectomy (Left, 06/14/2013); Knee arthroscopy (Right); Tumor removal (Left); Back surgery; Breast cyst excision (Left); Colostomy (1990s); Colostomy takedown (1990s);  Colonoscopy w/ biopsies and polypectomy; and Colon surgery.   Her family history includes Cancer in her daughter, sister, and sister; Early death in her brother; Hypertension in her father and mother; Kidney disease in her father and mother.She reports that she quit smoking about 40 years ago. Her smoking use included cigarettes. She started smoking about 64 years ago. She has a 2.9 pack-year smoking history. She quit smokeless tobacco use about 76 years ago.  Her smokeless tobacco use included snuff. She reports that she does not currently use alcohol. She reports that she does not use drugs.    ROS Review of Systems  Constitutional: Negative.   HENT: Negative.    Eyes:  Negative for visual disturbance.  Respiratory:  Negative for shortness of breath.   Cardiovascular:  Negative for chest pain.  Gastrointestinal:  Negative for abdominal pain.  Musculoskeletal:  Negative for arthralgias.    Objective:  BP (!) 176/82   Pulse 88   Temp (!) 97.5 F (36.4 C)   Ht 5\' 2"  (1.575 m)   Wt 133 lb 3.2 oz (60.4 kg)   SpO2 98%   BMI 24.36 kg/m   BP Readings from Last 3 Encounters:  10/23/22 (!) 176/82  10/21/22 137/81  08/14/22 (!) 179/86    Wt Readings from Last 3 Encounters:  10/23/22 133 lb 3.2 oz (60.4 kg)  08/14/22 134 lb 9.6 oz (61.1 kg)  07/22/22 137 lb (62.1 kg)     Physical Exam Constitutional:      General: She is not in acute distress.  Appearance: She is well-developed.  Cardiovascular:     Rate and Rhythm: Normal rate and regular rhythm.  Pulmonary:     Breath sounds: Normal breath sounds.  Musculoskeletal:        General: Normal range of motion.  Skin:    General: Skin is warm and dry.  Neurological:     Mental Status: She is alert and oriented to person, place, and time.       Assessment & Plan:   Anita Perez was seen today for medical management of chronic issues.  Diagnoses and all orders for this visit:  Essential hypertension, benign -     CBC  with Differential/Platelet -     CMP14+EGFR  Hyperlipidemia with target LDL less than 100 -     Lipid panel  Lumbar radiculopathy  Other orders -     gabapentin (NEURONTIN) 300 MG capsule; Take 2 capsules (600 mg total) by mouth 2 (two) times daily. -     celecoxib (CELEBREX) 400 MG capsule; Take 1 capsule (400 mg total) by mouth daily. With food -     amLODipine (NORVASC) 5 MG tablet; Take 1 tablet (5 mg total) by mouth daily. For blood pressure       I have discontinued Verline Lema. Guitron's amLODipine. I have also changed her gabapentin. Additionally, I am having her start on celecoxib and amLODipine. Lastly, I am having her maintain her aspirin, Calcium Carbonate-Vitamin D (CALCIUM + D PO), Vitamin D, multivitamin, polyethylene glycol powder, Alpha-Lipoic Acid, pramipexole, pantoprazole, atorvastatin, raloxifene, and alendronate.  Allergies as of 10/23/2022       Reactions   Diclofenac    SOB, nervous   Lyrica [pregabalin]    Pepto-bismol [bismuth] Other (See Comments)   Turned mouth black inside   Ropinirole Other (See Comments)   Jittery        Medication List        Accurate as of October 23, 2022 11:59 PM. If you have any questions, ask your nurse or doctor.          alendronate 70 MG tablet Commonly known as: FOSAMAX Take 1 tablet (70 mg total) by mouth every 7 (seven) days. Take with a full glass of water on an empty stomach.   Alpha-Lipoic Acid 200 MG Tabs Take by mouth.   amLODipine 5 MG tablet Commonly known as: NORVASC Take 1 tablet (5 mg total) by mouth daily. For blood pressure What changed:  medication strength how much to take Changed by: Broadus John Min Collymore   aspirin 81 MG tablet Take 81 mg by mouth daily.   atorvastatin 80 MG tablet Commonly known as: LIPITOR Take 1 tablet (80 mg total) by mouth daily.   CALCIUM + D PO Take 600 mg by mouth daily.   celecoxib 400 MG capsule Commonly known as: CeleBREX Take 1 capsule (400 mg total) by mouth  daily. With food Started by: Parisha Beaulac   gabapentin 300 MG capsule Commonly known as: NEURONTIN Take 2 capsules (600 mg total) by mouth 2 (two) times daily. What changed:  how much to take how to take this when to take this additional instructions Changed by: Broadus John Teigen Parslow   multivitamin tablet Take 1 tablet by mouth daily.   pantoprazole 40 MG tablet Commonly known as: PROTONIX Take 1 tablet (40 mg total) by mouth daily. For stomach   polyethylene glycol powder 17 GM/SCOOP powder Commonly known as: GLYCOLAX/MIRALAX DISSOLVE 17 GRAMS IN 8 OZ OF FLUID LIQUID DRINK DAILY AS DIRECTED  pramipexole 1.5 MG tablet Commonly known as: MIRAPEX Take 1 tablet (1.5 mg total) by mouth at bedtime. For leg and foot cramping   raloxifene 60 MG tablet Commonly known as: EVISTA TAKE 1 TABLET ONCE DAILY (FOR BONE HEALTH AND BREAST CANCER PREVENTION)   Vitamin D 50 MCG (2000 UT) Caps Take 2,000 Units by mouth daily.         Follow-up: Return in about 1 month (around 11/23/2022).  Mechele Claude, M.D.

## 2022-10-24 ENCOUNTER — Telehealth: Payer: Self-pay | Admitting: Family Medicine

## 2022-10-24 LAB — LIPID PANEL: Triglycerides: 107 mg/dL (ref 0–149)

## 2022-10-24 LAB — CBC WITH DIFFERENTIAL/PLATELET: Lymphs: 22 %

## 2022-11-07 ENCOUNTER — Encounter: Payer: Self-pay | Admitting: Family

## 2022-11-07 ENCOUNTER — Ambulatory Visit (INDEPENDENT_AMBULATORY_CARE_PROVIDER_SITE_OTHER): Payer: Medicare Other | Admitting: Family

## 2022-11-07 VITALS — BP 165/79 | HR 72 | Temp 98.2°F | Ht 62.0 in | Wt 132.0 lb

## 2022-11-07 DIAGNOSIS — G629 Polyneuropathy, unspecified: Secondary | ICD-10-CM | POA: Diagnosis not present

## 2022-11-07 DIAGNOSIS — R5383 Other fatigue: Secondary | ICD-10-CM

## 2022-11-07 DIAGNOSIS — M79605 Pain in left leg: Secondary | ICD-10-CM | POA: Diagnosis not present

## 2022-11-07 MED ORDER — GABAPENTIN 600 MG PO TABS
600.0000 mg | ORAL_TABLET | Freq: Three times a day (TID) | ORAL | 1 refills | Status: AC
Start: 2022-11-07 — End: ?

## 2022-11-07 NOTE — Patient Instructions (Signed)
Peripheral Neuropathy Peripheral neuropathy is a type of nerve damage. It affects nerves that carry signals between the spinal cord and the arms, legs, and the rest of the body (peripheral nerves). It does not affect nerves in the spinal cord or brain. In peripheral neuropathy, one nerve or a group of nerves may be damaged. Peripheral neuropathy is a broad category that includes many specific nerve disorders, like diabetic neuropathy, hereditary neuropathy, and carpal tunnel syndrome. What are the causes? This condition may be caused by: Certain diseases, such as: Diabetes. This is the most common cause of peripheral neuropathy. Autoimmune diseases, such as rheumatoid arthritis and systemic lupus erythematosus. Nerve diseases that are passed from parent to child (inherited). Kidney disease. Thyroid disease. Other causes may include: Nerve injury. Pressure or stress on a nerve that lasts a long time. Lack (deficiency) of B vitamins. This can result from alcoholism, poor diet, or a restricted diet. Infections. Some medicines, such as cancer medicines (chemotherapy). Poisonous (toxic) substances, such as lead and mercury. Too little blood flowing to the legs. In some cases, the cause of this condition is not known. What are the signs or symptoms? Symptoms of this condition depend on which of your nerves is damaged. Symptoms in the legs, hands, and arms can include: Loss of feeling (numbness) in the feet, hands, or both. Tingling in the feet, hands, or both. Burning pain. Very sensitive skin. Weakness. Not being able to move a part of the body (paralysis). Clumsiness or poor coordination. Muscle twitching. Loss of balance. Symptoms in other parts of the body can include: Not being able to control your bladder. Feeling dizzy. Sexual problems. How is this diagnosed? Diagnosing and finding the cause of peripheral neuropathy can be difficult. Your health care provider will take your  medical history and do a physical exam. A neurological exam will also be done. This involves checking things that are affected by your brain, spinal cord, and nerves (nervous system). For example, your health care provider will check your reflexes, how you move, and what you can feel. You may have other tests, such as: Blood tests. Electromyogram (EMG) and nerve conduction tests. These tests check nerve function and how well the nerves are controlling the muscles. Imaging tests, such as a CT scan or MRI, to rule out other causes of your symptoms. Removing a small piece of nerve to be examined in a lab (nerve biopsy). Removing and examining a small amount of the fluid that surrounds the brain and spinal cord (lumbar puncture). How is this treated? Treatment for this condition may involve: Treating the underlying cause of the neuropathy, such as diabetes, kidney disease, or vitamin deficiencies. Stopping medicines that can cause neuropathy, such as chemotherapy. Medicine to help relieve pain. Medicines may include: Prescription or over-the-counter pain medicine. Anti-seizure medicine. Antidepressants. Pain-relieving patches that are applied to painful areas of skin. Surgery to relieve pressure on a nerve or to destroy a nerve that is causing pain. Physical therapy to help improve movement and balance. Devices to help you move around (assistive devices). Follow these instructions at home: Medicines Take over-the-counter and prescription medicines only as told by your health care provider. Do not take any other medicines without first asking your health care provider. Ask your health care provider if the medicine prescribed to you requires you to avoid driving or using machinery. Lifestyle  Do not use any products that contain nicotine or tobacco. These products include cigarettes, chewing tobacco, and vaping devices, such as e-cigarettes. Smoking keeps  blood from reaching damaged nerves. If you  need help quitting, ask your health care provider. Avoid or limit alcohol. Too much alcohol can cause a vitamin B deficiency, and vitamin B is needed for healthy nerves. Eat a healthy diet. This includes: Eating foods that are high in fiber, such as beans, whole grains, and fresh fruits and vegetables. Limiting foods that are high in fat and processed sugars, such as fried or sweet foods. General instructions  If you have diabetes, work closely with your health care provider to keep your blood sugar under control. If you have numbness in your feet: Check every day for signs of injury or infection. Watch for redness, warmth, and swelling. Wear padded socks and comfortable shoes. These help protect your feet. Develop a good support system. Living with peripheral neuropathy can be stressful. Consider talking with a mental health specialist or joining a support group. Use assistive devices and attend physical therapy as told by your health care provider. This may include using a walker or a cane. Keep all follow-up visits. This is important. Where to find more information [employer] of Neurological Disorders: [employer] Contact a health care provider if: You have new signs or symptoms of peripheral neuropathy. You are struggling emotionally from dealing with peripheral neuropathy. Your pain is not well controlled. Get help right away if: You have an injury or infection that is not healing normally. You develop new weakness in an arm or leg. You have fallen or do so frequently. Summary Peripheral neuropathy is when the nerves in the arms or legs are damaged, resulting in numbness, weakness, or pain. There are many causes of peripheral neuropathy, including diabetes, pinched nerves, vitamin deficiencies, autoimmune disease, and hereditary conditions. Diagnosing and finding the cause of peripheral neuropathy can be difficult. Your health care provider will take your medical  history, do a physical exam, and do tests, including blood tests and nerve function tests. Treatment involves treating the underlying cause of the neuropathy and taking medicines to help control pain. Physical therapy and assistive devices may also help. This information is not intended to replace advice given to you by your health care provider. Make sure you discuss any questions you have with your health care provider. Document Revised: 11/20/2020 Document Reviewed: 11/20/2020 Elsevier Patient Education  2024 [employer].

## 2022-11-07 NOTE — Progress Notes (Signed)
Subjective:    Patient ID: Macie Burows, female    DOB: 03-12-1936, 87 y.o.   MRN: [id number]  Chief Complaint  Patient presents with   Leg Pain    No injury x3 dyas   PT presents to the office today with left leg pain that started three days ago. Denies any injury.  Leg Pain  The incident occurred 3 to 5 days ago. There was no injury mechanism. The pain is present in the left leg. The quality of the pain is described as shooting. The pain is at a severity of 8/10. The pain is moderate. The pain has been Intermittent since onset. Associated symptoms include tingling. Pertinent negatives include no muscle weakness or numbness. She reports no foreign bodies present. The symptoms are aggravated by weight bearing. She has tried rest and NSAIDs (gabapentin) for the symptoms.      Review of Systems  Neurological:  Positive for tingling. Negative for numbness.  All other systems reviewed and are negative.      Objective:   Physical Exam Vitals reviewed.  Constitutional:      General: She is not in acute distress.    Appearance: She is well-developed.  HENT:     Head: Normocephalic and atraumatic.  Eyes:     Pupils: Pupils are equal, round, and reactive to light.  Neck:     Thyroid: No thyromegaly.  Cardiovascular:     Rate and Rhythm: Normal rate and regular rhythm.     Heart sounds: Normal heart sounds. No murmur heard. Pulmonary:     Effort: Pulmonary effort is normal. No respiratory distress.     Breath sounds: Normal breath sounds. No wheezing.  Abdominal:     General: Bowel sounds are normal. There is no distension.     Palpations: Abdomen is soft.     Tenderness: There is no abdominal tenderness.  Musculoskeletal:        General: No tenderness. Normal range of motion.     Cervical back: Normal range of motion and neck supple.     Comments: Full ROM of left leg  Skin:    General: Skin is warm and dry.  Neurological:     Mental Status: She is alert and oriented to  person, place, and time.     Cranial Nerves: No cranial nerve deficit.     Motor: Weakness (using cane to walk) present.     Deep Tendon Reflexes: Reflexes are normal and symmetric.  Psychiatric:        Behavior: Behavior normal.        Thought Content: Thought content normal.        Judgment: Judgment normal.        BP (!) 165/79 Comment: at home  Pulse 72   Temp 98.2 F (36.8 C) (Temporal)   Ht 5\' 2"  (1.575 m)   Wt 132 lb (59.9 kg)   SpO2 96%   BMI 24.14 kg/m   Assessment & Plan:  HILDRETH MASTEN comes in today with chief complaint of Leg Pain (No injury x3 dyas)   Diagnosis and orders addressed:  1. Polyneuropathy - CMP14+EGFR - Vitamin B12 - TSH - gabapentin (NEURONTIN) 600 MG tablet; Take 1 tablet (600 mg total) by mouth 3 (three) times daily.  Dispense: 90 tablet; Refill: 1  2. Pain of left lower extremity - CMP14+EGFR - Vitamin B12 - TSH  3. Other fatigue - Vitamin B12 - TSH   Labs pending Will increase gabapentin to 600  mg TID from 600 mg BID Sedation precautions discussed If pain improves, can decrease back to BID Health Maintenance reviewed Diet and exercise encouraged  Follow up plan: Keep chronic follow up with PCP   Jannifer Rodney, FNP

## 2022-11-24 ENCOUNTER — Encounter: Payer: Self-pay | Admitting: Family Medicine

## 2022-11-24 ENCOUNTER — Other Ambulatory Visit: Payer: Medicare Other

## 2022-11-24 ENCOUNTER — Ambulatory Visit (INDEPENDENT_AMBULATORY_CARE_PROVIDER_SITE_OTHER): Payer: Medicare Other | Admitting: Family Medicine

## 2022-11-24 ENCOUNTER — Ambulatory Visit (INDEPENDENT_AMBULATORY_CARE_PROVIDER_SITE_OTHER): Payer: Medicare Other

## 2022-11-24 VITALS — BP 153/70 | HR 79 | Temp 97.7°F | Ht 62.0 in | Wt 133.0 lb

## 2022-11-24 DIAGNOSIS — G8929 Other chronic pain: Secondary | ICD-10-CM | POA: Diagnosis not present

## 2022-11-24 DIAGNOSIS — M25562 Pain in left knee: Secondary | ICD-10-CM

## 2022-11-24 DIAGNOSIS — R42 Dizziness and giddiness: Secondary | ICD-10-CM

## 2022-11-24 DIAGNOSIS — M79662 Pain in left lower leg: Secondary | ICD-10-CM | POA: Diagnosis not present

## 2022-11-24 DIAGNOSIS — M8580 Other specified disorders of bone density and structure, unspecified site: Secondary | ICD-10-CM | POA: Diagnosis not present

## 2022-11-24 DIAGNOSIS — G2581 Restless legs syndrome: Secondary | ICD-10-CM | POA: Diagnosis not present

## 2022-11-24 DIAGNOSIS — M7732 Calcaneal spur, left foot: Secondary | ICD-10-CM | POA: Diagnosis not present

## 2022-11-24 DIAGNOSIS — Z79899 Other long term (current) drug therapy: Secondary | ICD-10-CM | POA: Diagnosis not present

## 2022-11-24 DIAGNOSIS — I10 Essential (primary) hypertension: Secondary | ICD-10-CM | POA: Diagnosis not present

## 2022-11-24 DIAGNOSIS — E785 Hyperlipidemia, unspecified: Secondary | ICD-10-CM | POA: Diagnosis not present

## 2022-11-24 DIAGNOSIS — Z1231 Encounter for screening mammogram for malignant neoplasm of breast: Secondary | ICD-10-CM | POA: Diagnosis not present

## 2022-11-24 MED ORDER — ATORVASTATIN CALCIUM 80 MG PO TABS: 80.0000 mg | ORAL_TABLET | Freq: Every day | ORAL | 3 refills | Status: AC

## 2022-11-24 MED ORDER — ALENDRONATE SODIUM 70 MG PO TABS
70.0000 mg | ORAL_TABLET | ORAL | 3 refills | Status: DC
Start: 2022-11-24 — End: 2023-02-16

## 2022-11-24 MED ORDER — HYDROCODONE-ACETAMINOPHEN 5-325 MG PO TABS: 1.0000 | ORAL_TABLET | Freq: Three times a day (TID) | ORAL | 0 refills | Status: DC | PRN

## 2022-11-24 MED ORDER — MECLIZINE HCL 12.5 MG PO TABS: 6.2500 mg | ORAL_TABLET | Freq: Three times a day (TID) | ORAL | 0 refills | Status: DC | PRN

## 2022-11-24 NOTE — Addendum Note (Signed)
Addended by: Diamantina Monks on: 11/24/2022 03:50 PM   Modules accepted: Orders

## 2022-11-24 NOTE — Patient Instructions (Signed)
Taper off of gabapentin. Decrease to 2 daily for 2 weeks then 1 daily for 2 weeks. Then 1/2 daily for 2 weeks. Then discontinue the medication.

## 2022-11-24 NOTE — Progress Notes (Signed)
Subjective:  Patient ID: Anita Perez, female    DOB: [birth year]  Age: 87 y.o. MRN: [id number]  CC: Follow-up, Hypertension, and Pain   HPI Anita Perez presents for follow-up of elevated cholesterol. Doing well without complaints on current medication. Denies side effects of statin including myalgia and arthralgia and nausea. Also in today for liver function testing. Currently no chest pain, shortness of breath or other cardiovascular related symptoms noted.   presents for  follow-up of hypertension. Patient has no history of headache chest pain or shortness of breath or recent cough. Patient also denies symptoms of TIA such as focal numbness or weakness. Patient denies side effects from medication. States taking it regularly.She needs a refill on meclizine in case she gets vertigo again.   Let leg pain moderately sevre. NO relief with mirapex, gabapentin and celebrex. Today she insists on XR of the knee and tib-fib. She only gets relief with the hydrocodone.   [name] has a past medical history of Arthritis, Colon polyps, DDD (degenerative disc disease), lumbar, Diverticulitis (1990s), GERD (gastroesophageal reflux disease), Hiatal hernia, Hyperlipidemia, Hypertension, Osteopenia, Rectal carcinoma (HCC), SBO (small bowel obstruction) (HCC) (04/25/2016), and Small bowel obstruction (HCC) (04/25/2016).   She has a past surgical history that includes intestinal  tear; Appendectomy; Cosmetic surgery (Left); Lumbar laminectomy/decompression microdiscectomy (Left, 06/14/2013); Knee arthroscopy (Right); Tumor removal (Left); Back surgery; Breast cyst excision (Left); Colostomy (1990s); Colostomy takedown (1990s); Colonoscopy w/ biopsies and polypectomy; and Colon surgery.   Her family history includes Cancer in her daughter, sister, and sister; Early death in her brother; Hypertension in her father and mother; Kidney disease in her father and mother.She reports that she quit smoking about  40 years ago. Her smoking use included cigarettes. She started smoking about 64 years ago. She has a 2.9 pack-year smoking history. She quit smokeless tobacco use about 76 years ago.  Her smokeless tobacco use included snuff. She reports that she does not currently use alcohol. She reports that she does not use drugs.  Current Outpatient Medications on File Prior to Visit  Medication Sig Dispense Refill   Alpha-Lipoic Acid 200 MG TABS Take by mouth.     amLODipine (NORVASC) 5 MG tablet Take 1 tablet (5 mg total) by mouth daily. For blood pressure     aspirin 81 MG tablet Take 81 mg by mouth daily.     Calcium Carbonate-Vitamin D (CALCIUM + D PO) Take 600 mg by mouth daily.     celecoxib (CELEBREX) 400 MG capsule Take 1 capsule (400 mg total) by mouth daily. With food 90 capsule 3   Cholecalciferol (VITAMIN D) 2000 units CAPS Take 2,000 Units by mouth daily.     gabapentin (NEURONTIN) 600 MG tablet Take 1 tablet (600 mg total) by mouth 3 (three) times daily. 90 tablet 1   Multiple Vitamin (MULTIVITAMIN) tablet Take 1 tablet by mouth daily.     pantoprazole (PROTONIX) 40 MG tablet Take 1 tablet (40 mg total) by mouth daily. For stomach 90 tablet 1   polyethylene glycol powder (GLYCOLAX/MIRALAX) 17 GM/SCOOP powder DISSOLVE 17 GRAMS IN 8 OZ OF FLUID LIQUID DRINK DAILY AS DIRECTED 510 g 0   pramipexole (MIRAPEX) 1.5 MG tablet Take 1 tablet (1.5 mg total) by mouth at bedtime. For leg and foot cramping 90 tablet 1   raloxifene (EVISTA) 60 MG tablet TAKE 1 TABLET ONCE DAILY (FOR BONE HEALTH AND BREAST CANCER PREVENTION) 90 tablet 2   No current facility-administered medications on file prior  to visit.    ROS Review of Systems  Constitutional: Negative.   HENT: Negative.    Eyes:  Negative for visual disturbance.  Respiratory:  Negative for shortness of breath.   Cardiovascular:  Negative for chest pain.  Gastrointestinal:  Negative for abdominal pain.  Musculoskeletal:  Negative for arthralgias.     Objective:  BP (!) 153/70   Pulse 79   Temp 97.7 F (36.5 C)   Ht 5\' 2"  (1.575 m)   Wt 133 lb (60.3 kg)   SpO2 96%   BMI 24.33 kg/m   BP Readings from Last 3 Encounters:  11/24/22 (!) 153/70  11/07/22 (!) 165/79  10/23/22 (!) 176/82    Wt Readings from Last 3 Encounters:  11/24/22 133 lb (60.3 kg)  11/07/22 132 lb (59.9 kg)  10/23/22 133 lb 3.2 oz (60.4 kg)     Physical Exam Constitutional:      General: She is not in acute distress.    Appearance: She is well-developed.  Cardiovascular:     Rate and Rhythm: Normal rate and regular rhythm.  Pulmonary:     Breath sounds: Normal breath sounds.  Musculoskeletal:        General: Deformity (left calf is small compared to the right.) present. Normal range of motion.  Skin:    General: Skin is warm and dry.  Neurological:     Mental Status: She is alert and oriented to person, place, and time.       Lab Results  Component Value Date   WBC 9.4 10/23/2022   HGB 11.3 10/23/2022   HCT 36.3 10/23/2022   PLT 263 10/23/2022   GLUCOSE 85 11/07/2022   CHOL 209 (H) 10/23/2022   TRIG 107 10/23/2022   HDL 54 10/23/2022   LDLCALC 136 (H) 10/23/2022   ALT 14 11/07/2022   AST 21 11/07/2022   NA 142 11/07/2022   K 4.3 11/07/2022   CL 106 11/07/2022   CREATININE 0.99 11/07/2022   BUN 21 11/07/2022   CO2 21 11/07/2022   TSH 1.340 11/07/2022    MR LUMBAR SPINE WO CONTRAST  Result Date: 04/29/2022 CLINICAL DATA:  Pain in the bilateral legs and feet. Right hip pain for 2 months EXAM: MRI LUMBAR SPINE WITHOUT CONTRAST TECHNIQUE: Multiplanar, multisequence MR imaging of the lumbar spine was performed. No intravenous contrast was administered. COMPARISON:  03/04/2020 FINDINGS: Segmentation:  Standard. Alignment:  Mild dextroscoliosis and L5-S1 anterolisthesis Vertebrae:  No fracture, evidence of discitis, or bone lesion. Conus medullaris and cauda equina: Conus extends to the L1-2 level. Conus and cauda equina appear normal.  Paraspinal and other soft tissues: Area of posterior scarring in the soft tissues at L4-5. Disc levels: L1-L2: Small central disc protrusion L2-L3: Disc narrowing and endplate degeneration with circumferential bulge and ridging. Facet spurring and ligamentum flavum thickening. Compressive spinal stenosis that is mildly progressed. Moderate left foraminal stenosis L3-L4: Disc narrowing and bulging with central protrusion. Facet spurring and ligamentum flavum thickening. Advanced spinal stenosis. Moderate bilateral foraminal narrowing L4-L5: Disc narrowing and circumferential bulging with left foraminal herniation. Left more than right subarticular recess stenosis. Biforaminal impingement. Patent central canal after prior decompression L5-S1:Disc narrowing and bulging. Degenerative facet spurring on both sides. Moderate bilateral foraminal stenosis. IMPRESSION: 1. Generalized lumbar spine degeneration with scoliosis and L5-S1 mild anterolisthesis. Compressive spinal stenosis at L2-3 and L3-4, mildly progressed at L2-3 when compared to 2021. 2. L4-5 prior decompression with patent central canal. Left more than right subarticular recess stenosis at this  level. 3. High-grade foraminal stenosis bilaterally at L4-5. Moderate foraminal narrowings at L2-3, L3-4, and L5-S1. Electronically Signed   By: Tiburcio Pea M.D.   On: 04/29/2022 04:03    Assessment & Plan:   Lekeesha was seen today for follow-up, hypertension and pain.  Diagnoses and all orders for this visit:  Encounter for screening mammogram for malignant neoplasm of breast -     MM 3D SCREENING MAMMOGRAM BILATERAL BREAST; Future  Restless leg syndrome  Vertigo -     meclizine (ANTIVERT) 12.5 MG tablet; Take 0.5 tablets (6.25 mg total) by mouth 3 (three) times daily as needed for dizziness.  Osteopenia, unspecified location -     alendronate (FOSAMAX) 70 MG tablet; Take 1 tablet (70 mg total) by mouth every 7 (seven) days. Take with a full glass  of water on an empty stomach.  Controlled substance agreement signed  Essential hypertension, benign  Hyperlipidemia with target LDL less than 100  Chronic pain of left knee -     Cancel: DG Knee 1-2 Views Left; Future -     DG Tibia/Fibula Left; Future  Other orders -     atorvastatin (LIPITOR) 80 MG tablet; Take 1 tablet (80 mg total) by mouth daily. -     HYDROcodone-acetaminophen (NORCO/VICODIN) 5-325 MG tablet; Take 1 tablet by mouth 3 (three) times daily as needed for moderate pain.   I have changed Anita Perez. Anita Perez HYDROcodone-acetaminophen. I am also having her maintain her aspirin, Calcium Carbonate-Vitamin D (CALCIUM + D PO), Vitamin D, multivitamin, polyethylene glycol powder, Alpha-Lipoic Acid, pramipexole, pantoprazole, raloxifene, celecoxib, amLODipine, gabapentin, atorvastatin, meclizine, and alendronate.  Meds ordered this encounter  Medications   atorvastatin (LIPITOR) 80 MG tablet    Sig: Take 1 tablet (80 mg total) by mouth daily.    Dispense:  90 tablet    Refill:  3   meclizine (ANTIVERT) 12.5 MG tablet    Sig: Take 0.5 tablets (6.25 mg total) by mouth 3 (three) times daily as needed for dizziness.    Dispense:  90 tablet    Refill:  0   alendronate (FOSAMAX) 70 MG tablet    Sig: Take 1 tablet (70 mg total) by mouth every 7 (seven) days. Take with a full glass of water on an empty stomach.    Dispense:  13 tablet    Refill:  3   HYDROcodone-acetaminophen (NORCO/VICODIN) 5-325 MG tablet    Sig: Take 1 tablet by mouth 3 (three) times daily as needed for moderate pain.    Dispense:  90 tablet    Refill:  0     Follow-up: Return in about 1 month (around 12/25/2022).  Mechele Claude, M.D.

## 2022-11-26 LAB — TOXASSURE SELECT 13 (MW), URINE

## 2022-12-17 DIAGNOSIS — M5416 Radiculopathy, lumbar region: Secondary | ICD-10-CM | POA: Diagnosis not present

## 2023-01-01 ENCOUNTER — Encounter: Payer: Self-pay | Admitting: Family Medicine

## 2023-01-01 ENCOUNTER — Ambulatory Visit: Payer: Medicare Other | Admitting: Family Medicine

## 2023-01-01 ENCOUNTER — Ambulatory Visit (INDEPENDENT_AMBULATORY_CARE_PROVIDER_SITE_OTHER): Payer: Medicare Other

## 2023-01-01 VITALS — BP 175/94 | HR 83 | Temp 97.6°F | Ht 62.0 in | Wt 128.0 lb

## 2023-01-01 DIAGNOSIS — M5416 Radiculopathy, lumbar region: Secondary | ICD-10-CM | POA: Diagnosis not present

## 2023-01-01 DIAGNOSIS — G629 Polyneuropathy, unspecified: Secondary | ICD-10-CM

## 2023-01-01 DIAGNOSIS — R2689 Other abnormalities of gait and mobility: Secondary | ICD-10-CM | POA: Diagnosis not present

## 2023-01-01 DIAGNOSIS — Z23 Encounter for immunization: Secondary | ICD-10-CM

## 2023-01-01 DIAGNOSIS — I10 Essential (primary) hypertension: Secondary | ICD-10-CM | POA: Diagnosis not present

## 2023-01-01 DIAGNOSIS — M85872 Other specified disorders of bone density and structure, left ankle and foot: Secondary | ICD-10-CM | POA: Diagnosis not present

## 2023-01-01 DIAGNOSIS — S9032XA Contusion of left foot, initial encounter: Secondary | ICD-10-CM | POA: Diagnosis not present

## 2023-01-01 DIAGNOSIS — M79672 Pain in left foot: Secondary | ICD-10-CM | POA: Diagnosis not present

## 2023-01-01 DIAGNOSIS — M19072 Primary osteoarthritis, left ankle and foot: Secondary | ICD-10-CM | POA: Diagnosis not present

## 2023-01-01 MED ORDER — AMLODIPINE BESYLATE 10 MG PO TABS
10.0000 mg | ORAL_TABLET | Freq: Every day | ORAL | 0 refills | Status: DC
Start: 2023-01-01 — End: 2023-02-16

## 2023-01-01 MED ORDER — GABAPENTIN 600 MG PO TABS
600.0000 mg | ORAL_TABLET | Freq: Three times a day (TID) | ORAL | 1 refills | Status: DC
Start: 2023-01-01 — End: 2023-06-11

## 2023-01-01 MED ORDER — HYDROCODONE-ACETAMINOPHEN 5-325 MG PO TABS: 1.0000 | ORAL_TABLET | Freq: Three times a day (TID) | ORAL | 0 refills | Status: DC | PRN

## 2023-01-01 NOTE — Progress Notes (Signed)
Subjective:  Patient ID: Anita Perez, female    DOB: [birth year]  Age: 87 y.o. MRN: [id number]  CC: Follow-up and Hypertension   HPI Anita Perez presents for  follow-up of hypertension. Patient has no history of headache chest pain or shortness of breath or recent cough. Patient also denies symptoms of TIA such as focal numbness or weakness. Patient denies side effects from medication. States taking it regularly.  Fell this morning. Left first toe folded under her. Has bruise and pain at ball, left foot medially.    History Stellarose has a past medical history of Arthritis, Colon polyps, DDD (degenerative disc disease), lumbar, Diverticulitis (1990s), GERD (gastroesophageal reflux disease), Hiatal hernia, Hyperlipidemia, Hypertension, Osteopenia, Rectal carcinoma (HCC), SBO (small bowel obstruction) (HCC) (04/25/2016), and Small bowel obstruction (HCC) (04/25/2016).   She has a past surgical history that includes intestinal  tear; Appendectomy; Cosmetic surgery (Left); Lumbar laminectomy/decompression microdiscectomy (Left, 06/14/2013); Knee arthroscopy (Right); Tumor removal (Left); Back surgery; Breast cyst excision (Left); Colostomy (1990s); Colostomy takedown (1990s); Colonoscopy w/ biopsies and polypectomy; and Colon surgery.   Her family history includes Cancer in her daughter, sister, and sister; Early death in her brother; Hypertension in her father and mother; Kidney disease in her father and mother.She reports that she quit smoking about 40 years ago. Her smoking use included cigarettes. She started smoking about 64 years ago. She has a 2.9 pack-year smoking history. She quit smokeless tobacco use about 76 years ago.  Her smokeless tobacco use included snuff. She reports that she does not currently use alcohol. She reports that she does not use drugs.  Current Outpatient Medications on File Prior to Visit  Medication Sig Dispense Refill   alendronate (FOSAMAX) 70 MG tablet Take 1  tablet (70 mg total) by mouth every 7 (seven) days. Take with a full glass of water on an empty stomach. 13 tablet 3   Alpha-Lipoic Acid 200 MG TABS Take by mouth.     aspirin 81 MG tablet Take 81 mg by mouth daily.     atorvastatin (LIPITOR) 80 MG tablet Take 1 tablet (80 mg total) by mouth daily. 90 tablet 3   Calcium Carbonate-Vitamin D (CALCIUM + D PO) Take 600 mg by mouth daily.     celecoxib (CELEBREX) 400 MG capsule Take 1 capsule (400 mg total) by mouth daily. With food 90 capsule 3   Cholecalciferol (VITAMIN D) 2000 units CAPS Take 2,000 Units by mouth daily.     meclizine (ANTIVERT) 12.5 MG tablet Take 0.5 tablets (6.25 mg total) by mouth 3 (three) times daily as needed for dizziness. 90 tablet 0   Multiple Vitamin (MULTIVITAMIN) tablet Take 1 tablet by mouth daily.     pantoprazole (PROTONIX) 40 MG tablet Take 1 tablet (40 mg total) by mouth daily. For stomach 90 tablet 1   polyethylene glycol powder (GLYCOLAX/MIRALAX) 17 GM/SCOOP powder DISSOLVE 17 GRAMS IN 8 OZ OF FLUID LIQUID DRINK DAILY AS DIRECTED 510 g 0   pramipexole (MIRAPEX) 1.5 MG tablet Take 1 tablet (1.5 mg total) by mouth at bedtime. For leg and foot cramping 90 tablet 1   raloxifene (EVISTA) 60 MG tablet TAKE 1 TABLET ONCE DAILY (FOR BONE HEALTH AND BREAST CANCER PREVENTION) 90 tablet 2   No current facility-administered medications on file prior to visit.    ROS Review of Systems  Constitutional: Negative.   HENT: Negative.    Eyes:  Negative for visual disturbance.  Respiratory:  Negative for shortness of breath.  Cardiovascular:  Negative for chest pain.  Gastrointestinal:  Negative for abdominal pain.  Musculoskeletal:  Negative for arthralgias.    Objective:  BP (!) 175/94   Pulse 83   Temp 97.6 F (36.4 C)   Ht 5\' 2"  (1.575 m)   Wt 128 lb (58.1 kg)   SpO2 96%   BMI 23.41 kg/m   BP Readings from Last 3 Encounters:  01/01/23 (!) 175/94  11/24/22 (!) 153/70  11/07/22 (!) 165/79    Wt Readings  from Last 3 Encounters:  01/01/23 128 lb (58.1 kg)  11/24/22 133 lb (60.3 kg)  11/07/22 132 lb (59.9 kg)     Physical Exam Constitutional:      General: She is not in acute distress.    Appearance: She is well-developed.  Cardiovascular:     Rate and Rhythm: Normal rate and regular rhythm.  Pulmonary:     Breath sounds: Normal breath sounds.  Musculoskeletal:        General: Normal range of motion.  Skin:    General: Skin is warm and dry.  Neurological:     Mental Status: She is alert and oriented to person, place, and time.    XR left foot: No fracture noted   Assessment & Plan:   Bralyn was seen today for follow-up and hypertension.  Diagnoses and all orders for this visit:  Contusion of left foot, initial encounter -     DG Foot Complete Left; Future  Polyneuropathy -     gabapentin (NEURONTIN) 600 MG tablet; Take 1 tablet (600 mg total) by mouth 3 (three) times daily. -     Ambulatory referral to Home Health  Accelerated hypertension -     amLODipine (NORVASC) 10 MG tablet; Take 1 tablet (10 mg total) by mouth daily.  Lumbar radiculopathy -     Ambulatory referral to Home Health  Imbalance -     Ambulatory referral to Home Health  Other orders -     HYDROcodone-acetaminophen (NORCO/VICODIN) 5-325 MG tablet; Take 1 tablet by mouth 3 (three) times daily as needed for moderate pain.   Allergies as of 01/01/2023       Reactions   Diclofenac    SOB, nervous   Lyrica [pregabalin]    Pepto-bismol [bismuth] Other (See Comments)   Turned mouth black inside   Ropinirole Other (See Comments)   Jittery        Medication List        Accurate as of January 01, 2023  2:35 PM. If you have any questions, ask your nurse or doctor.          alendronate 70 MG tablet Commonly known as: FOSAMAX Take 1 tablet (70 mg total) by mouth every 7 (seven) days. Take with a full glass of water on an empty stomach.   Alpha-Lipoic Acid 200 MG Tabs Take by mouth.    amLODipine 10 MG tablet Commonly known as: NORVASC Take 1 tablet (10 mg total) by mouth daily. What changed:  medication strength how much to take additional instructions Changed by: Broadus John Averee Harb   aspirin 81 MG tablet Take 81 mg by mouth daily.   atorvastatin 80 MG tablet Commonly known as: LIPITOR Take 1 tablet (80 mg total) by mouth daily.   CALCIUM + D PO Take 600 mg by mouth daily.   celecoxib 400 MG capsule Commonly known as: CeleBREX Take 1 capsule (400 mg total) by mouth daily. With food   gabapentin 600 MG tablet Commonly known  as: NEURONTIN Take 1 tablet (600 mg total) by mouth 3 (three) times daily.   HYDROcodone-acetaminophen 5-325 MG tablet Commonly known as: NORCO/VICODIN Take 1 tablet by mouth 3 (three) times daily as needed for moderate pain.   meclizine 12.5 MG tablet Commonly known as: ANTIVERT Take 0.5 tablets (6.25 mg total) by mouth 3 (three) times daily as needed for dizziness.   multivitamin tablet Take 1 tablet by mouth daily.   pantoprazole 40 MG tablet Commonly known as: PROTONIX Take 1 tablet (40 mg total) by mouth daily. For stomach   polyethylene glycol powder 17 GM/SCOOP powder Commonly known as: GLYCOLAX/MIRALAX DISSOLVE 17 GRAMS IN 8 OZ OF FLUID LIQUID DRINK DAILY AS DIRECTED   pramipexole 1.5 MG tablet Commonly known as: MIRAPEX Take 1 tablet (1.5 mg total) by mouth at bedtime. For leg and foot cramping   raloxifene 60 MG tablet Commonly known as: EVISTA TAKE 1 TABLET ONCE DAILY (FOR BONE HEALTH AND BREAST CANCER PREVENTION)   Vitamin D 50 MCG (2000 UT) Caps Take 2,000 Units by mouth daily.        Meds ordered this encounter  Medications   gabapentin (NEURONTIN) 600 MG tablet    Sig: Take 1 tablet (600 mg total) by mouth 3 (three) times daily.    Dispense:  90 tablet    Refill:  1   HYDROcodone-acetaminophen (NORCO/VICODIN) 5-325 MG tablet    Sig: Take 1 tablet by mouth 3 (three) times daily as needed for moderate  pain.    Dispense:  90 tablet    Refill:  0   amLODipine (NORVASC) 10 MG tablet    Sig: Take 1 tablet (10 mg total) by mouth daily.    Dispense:  90 tablet    Refill:  0      Follow-up: Return in about 1 month (around 02/01/2023).  Mechele Claude, M.D.

## 2023-01-01 NOTE — Addendum Note (Signed)
Addended by: Adella Hare B on: 01/01/2023 04:53 PM   Modules accepted: Orders

## 2023-01-05 DIAGNOSIS — I83893 Varicose veins of bilateral lower extremities with other complications: Secondary | ICD-10-CM | POA: Diagnosis not present

## 2023-01-05 DIAGNOSIS — I87393 Chronic venous hypertension (idiopathic) with other complications of bilateral lower extremity: Secondary | ICD-10-CM | POA: Diagnosis not present

## 2023-01-13 ENCOUNTER — Encounter: Payer: Self-pay | Admitting: Family Medicine

## 2023-02-16 ENCOUNTER — Encounter: Payer: Self-pay | Admitting: Family Medicine

## 2023-02-16 ENCOUNTER — Ambulatory Visit (INDEPENDENT_AMBULATORY_CARE_PROVIDER_SITE_OTHER): Payer: Medicare Other | Admitting: Family Medicine

## 2023-02-16 VITALS — BP 150/67 | HR 79 | Temp 97.9°F | Ht 62.0 in | Wt 126.8 lb

## 2023-02-16 DIAGNOSIS — M8580 Other specified disorders of bone density and structure, unspecified site: Secondary | ICD-10-CM

## 2023-02-16 DIAGNOSIS — I10 Essential (primary) hypertension: Secondary | ICD-10-CM | POA: Diagnosis not present

## 2023-02-16 DIAGNOSIS — E785 Hyperlipidemia, unspecified: Secondary | ICD-10-CM

## 2023-02-16 DIAGNOSIS — M5416 Radiculopathy, lumbar region: Secondary | ICD-10-CM | POA: Diagnosis not present

## 2023-02-16 MED ORDER — ALENDRONATE SODIUM 70 MG PO TABS: 70.0000 mg | ORAL_TABLET | ORAL | 3 refills | Status: AC

## 2023-02-16 MED ORDER — AMLODIPINE BESYLATE 10 MG PO TABS
10.0000 mg | ORAL_TABLET | Freq: Every day | ORAL | 3 refills | Status: DC
Start: 2023-02-16 — End: 2023-05-25

## 2023-02-16 MED ORDER — HYDROCODONE-ACETAMINOPHEN 5-325 MG PO TABS: 1.0000 | ORAL_TABLET | Freq: Three times a day (TID) | ORAL | 0 refills | Status: DC | PRN

## 2023-02-16 MED ORDER — RALOXIFENE HCL 60 MG PO TABS: ORAL_TABLET | ORAL | 3 refills | Status: AC

## 2023-02-16 MED ORDER — MEGESTROL ACETATE 400 MG/10ML PO SUSP: 400.0000 mg | Freq: Two times a day (BID) | ORAL | 2 refills | Status: AC

## 2023-02-16 NOTE — Progress Notes (Signed)
Subjective:  Patient ID: Anita Perez, female    DOB: 08/30/1935  Age: 87 y.o. MRN: [id number]  CC: Follow-up   HPI Anita Perez presents for elevated BP and varicose veins. Multiple BP readings from home running 130s/70s. Taking meds as directed. Having ablation of varicosities on 12/16. Plan is to relieve pain and swelling.   presents for  follow-up of hypertension. Patient has no history of headache chest pain or shortness of breath or recent cough. Patient also denies symptoms of TIA such as focal numbness or weakness. Patient denies side effects from medication. States taking it regularly.  Pt. Continues with meds for bone health, Tolerating well.   Pt. Requiring hydrocodone for relief of the lumbar radicular pain. Working well at current dose. Anita Perez responded to gabapentin.       02/16/2023    1:47 PM 01/01/2023   12:59 PM 11/24/2022    1:24 PM  Depression screen PHQ 2/9  Decreased Interest 0 0 1  Down, Depressed, Hopeless 0 0 0  PHQ - 2 Score 0 0 1  Altered sleeping   1  Tired, decreased energy   1  Change in appetite   1  Feeling bad or failure about yourself    0  Trouble concentrating   0  Moving slowly or fidgety/restless   0  Suicidal thoughts   0  PHQ-9 Score   4  Difficult doing work/chores   Somewhat difficult    [name] has a past medical history of Arthritis, Colon polyps, DDD (degenerative disc disease), lumbar, Diverticulitis (1990s), GERD (gastroesophageal reflux disease), Hiatal hernia, Hyperlipidemia, Hypertension, Osteopenia, Rectal carcinoma (HCC), SBO (small bowel obstruction) (HCC) (04/25/2016), and Small bowel obstruction (HCC) (04/25/2016).   She has a past surgical history that includes intestinal  tear; Appendectomy; Cosmetic surgery (Left); Lumbar laminectomy/decompression microdiscectomy (Left, 06/14/2013); Knee arthroscopy (Right); Tumor removal (Left); Back surgery; Breast cyst excision (Left); Colostomy (1990s); Colostomy takedown  (1990s); Colonoscopy w/ biopsies and polypectomy; and Colon surgery.   Her family history includes Cancer in her daughter, sister, and sister; Early death in her brother; Hypertension in her father and mother; Kidney disease in her father and mother.She reports that she quit smoking about 40 years ago. Her smoking use included cigarettes. She started smoking about 64 years ago. She has a 2.9 pack-year smoking history. She quit smokeless tobacco use about 76 years ago.  Her smokeless tobacco use included snuff. She reports that she does not currently use alcohol. She reports that she does not use drugs.    ROS Review of Systems  Constitutional: Negative.   HENT: Negative.    Eyes:  Negative for visual disturbance.  Respiratory:  Negative for shortness of breath.   Cardiovascular:  Negative for chest pain.  Gastrointestinal:  Negative for abdominal pain.  Musculoskeletal:  Positive for back pain and myalgias (leg pain and swelling from varicosities). Negative for arthralgias.    Objective:  BP (!) 150/67   Pulse 79   Temp 97.9 F (36.6 C)   Ht 5\' 2"  (1.575 m)   Wt 126 lb 12.8 oz (57.5 kg)   SpO2 95%   BMI 23.19 kg/m   BP Readings from Last 3 Encounters:  02/16/23 (!) 150/67  01/01/23 (!) 175/94  11/24/22 (!) 153/70    Wt Readings from Last 3 Encounters:  02/16/23 126 lb 12.8 oz (57.5 kg)  01/01/23 128 lb (58.1 kg)  11/24/22 133 lb (60.3 kg)     Physical Exam Constitutional:  General: She is not in acute distress.    Appearance: She is well-developed.  Cardiovascular:     Rate and Rhythm: Normal rate and regular rhythm.  Pulmonary:     Breath sounds: Normal breath sounds.  Musculoskeletal:        General: Normal range of motion.  Skin:    General: Skin is warm and dry.  Neurological:     Mental Status: She is alert and oriented to person, place, and time.       Assessment & Plan:   Anita Perez was seen today for follow-up.  Diagnoses and all orders for  this visit:  Accelerated hypertension -     CBC with Differential/Platelet -     CMP14+EGFR -     amLODipine (NORVASC) 10 MG tablet; Take 1 tablet (10 mg total) by mouth daily.  Hyperlipidemia with target LDL less than 100 -     Lipid panel  Osteopenia determined by x-ray -     raloxifene (EVISTA) 60 MG tablet; TAKE 1 TABLET ONCE DAILY (FOR BONE HEALTH AND BREAST CANCER PREVENTION)  Osteopenia, unspecified location -     alendronate (FOSAMAX) 70 MG tablet; Take 1 tablet (70 mg total) by mouth every 7 (seven) days. Take with a full glass of water on an empty stomach.  Lumbar radiculopathy  Other orders -     HYDROcodone-acetaminophen (NORCO/VICODIN) 5-325 MG tablet; Take 1 tablet by mouth 3 (three) times daily as needed for moderate pain (pain score 4-6). -     megestrol (MEGACE) 400 MG/10ML suspension; Take 10 mLs (400 mg total) by mouth 2 (two) times daily. For appetite stimulation       I have discontinued Anita Perez. Anita Perez's pramipexole. I have also changed her HYDROcodone-acetaminophen. Additionally, I am having her start on megestrol. Lastly, I am having her maintain her aspirin, Calcium Carbonate-Vitamin D (CALCIUM + D PO), Vitamin D, multivitamin, polyethylene glycol powder, Alpha-Lipoic Acid, pantoprazole, celecoxib, atorvastatin, meclizine, gabapentin, amLODipine, raloxifene, and alendronate.  Allergies as of 02/16/2023       Reactions   Diclofenac    SOB, nervous   Lyrica [pregabalin]    Pepto-bismol [bismuth] Other (See Comments)   Turned mouth black inside   Ropinirole Other (See Comments)   Jittery        Medication List        Accurate as of February 16, 2023  2:18 PM. If you have any questions, ask your nurse or doctor.          STOP taking these medications    pramipexole 1.5 MG tablet Commonly known as: MIRAPEX Stopped by: Anita Perez       TAKE these medications    alendronate 70 MG tablet Commonly known as: FOSAMAX Take 1 tablet  (70 mg total) by mouth every 7 (seven) days. Take with a full glass of water on an empty stomach.   Alpha-Lipoic Acid 200 MG Tabs Take by mouth.   amLODipine 10 MG tablet Commonly known as: NORVASC Take 1 tablet (10 mg total) by mouth daily.   aspirin 81 MG tablet Take 81 mg by mouth daily.   atorvastatin 80 MG tablet Commonly known as: LIPITOR Take 1 tablet (80 mg total) by mouth daily.   CALCIUM + D PO Take 600 mg by mouth daily.   celecoxib 400 MG capsule Commonly known as: CeleBREX Take 1 capsule (400 mg total) by mouth daily. With food   gabapentin 600 MG tablet Commonly known as: NEURONTIN Take 1 tablet (  600 mg total) by mouth 3 (three) times daily.   HYDROcodone-acetaminophen 5-325 MG tablet Commonly known as: NORCO/VICODIN Take 1 tablet by mouth 3 (three) times daily as needed for moderate pain (pain score 4-6).   meclizine 12.5 MG tablet Commonly known as: ANTIVERT Take 0.5 tablets (6.25 mg total) by mouth 3 (three) times daily as needed for dizziness.   megestrol 400 MG/10ML suspension Commonly known as: MEGACE Take 10 mLs (400 mg total) by mouth 2 (two) times daily. For appetite stimulation Started by: Broadus John Emon Miggins   multivitamin tablet Take 1 tablet by mouth daily.   pantoprazole 40 MG tablet Commonly known as: PROTONIX Take 1 tablet (40 mg total) by mouth daily. For stomach   polyethylene glycol powder 17 GM/SCOOP powder Commonly known as: GLYCOLAX/MIRALAX DISSOLVE 17 GRAMS IN 8 OZ OF FLUID LIQUID DRINK DAILY AS DIRECTED   raloxifene 60 MG tablet Commonly known as: EVISTA TAKE 1 TABLET ONCE DAILY (FOR BONE HEALTH AND BREAST CANCER PREVENTION)   Vitamin D 50 MCG (2000 UT) Caps Take 2,000 Units by mouth daily.         Follow-up: Return in about 3 months (around 05/19/2023).  Mechele Claude, M.D.

## 2023-02-17 LAB — CMP14+EGFR
ALT: 14 [IU]/L (ref 0–32)
AST: 22 [IU]/L (ref 0–40)
Albumin: 4.1 g/dL (ref 3.7–4.7)
Alkaline Phosphatase: 51 [IU]/L (ref 44–121)
BUN/Creatinine Ratio: 23 (ref 12–28)
BUN: 20 mg/dL (ref 8–27)
Bilirubin Total: 0.3 mg/dL (ref 0.0–1.2)
CO2: 22 mmol/L (ref 20–29)
Calcium: 9.3 mg/dL (ref 8.7–10.3)
Chloride: 109 mmol/L — ABNORMAL HIGH (ref 96–106)
Creatinine, Ser: 0.88 mg/dL (ref 0.57–1.00)
Globulin, Total: 2.3 g/dL (ref 1.5–4.5)
Glucose: 96 mg/dL (ref 70–99)
Potassium: 3.8 mmol/L (ref 3.5–5.2)
Sodium: 144 mmol/L (ref 134–144)
Total Protein: 6.4 g/dL (ref 6.0–8.5)
eGFR: 64 mL/min/{1.73_m2} (ref >59)

## 2023-02-17 LAB — CBC WITH DIFFERENTIAL/PLATELET
Basophils Absolute: 0.1 10*3/uL (ref 0.0–0.2)
Basos: 1 %
EOS (ABSOLUTE): 0.1 10*3/uL (ref 0.0–0.4)
Eos: 2 %
Hematocrit: 34.5 % (ref 34.0–46.6)
Hemoglobin: 10.5 g/dL — ABNORMAL LOW (ref 11.1–15.9)
Immature Grans (Abs): 0 10*3/uL (ref 0.0–0.1)
Immature Granulocytes: 0 %
Lymphocytes Absolute: 1.7 10*3/uL (ref 0.7–3.1)
Lymphs: 28 %
MCH: 26.7 pg (ref 26.6–33.0)
MCHC: 30.4 g/dL — ABNORMAL LOW (ref 31.5–35.7)
MCV: 88 fL (ref 79–97)
Monocytes Absolute: 0.6 10*3/uL (ref 0.1–0.9)
Monocytes: 9 %
Neutrophils Absolute: 3.8 10*3/uL (ref 1.4–7.0)
Neutrophils: 60 %
Platelets: 307 10*3/uL (ref 150–450)
RBC: 3.93 x10E6/uL (ref 3.77–5.28)
RDW: 14.5 % (ref 11.7–15.4)
WBC: 6.2 10*3/uL (ref 3.4–10.8)

## 2023-02-17 LAB — LIPID PANEL
Chol/HDL Ratio: 3.6 ratio (ref 0.0–4.4)
Cholesterol, Total: 189 mg/dL (ref 100–199)
HDL: 52 mg/dL (ref >39)
LDL Chol Calc (NIH): 120 mg/dL — ABNORMAL HIGH (ref 0–99)
Triglycerides: 96 mg/dL (ref 0–149)
VLDL Cholesterol Cal: 17 mg/dL (ref 5–40)

## 2023-02-17 NOTE — Progress Notes (Signed)
Hello Sophiea,  Your lab result is normal and/or stable.Some minor variations that are not significant are commonly marked abnormal, but do not represent any medical problem for you.  Best regards, Lariza Cothron, M.D.

## 2023-03-16 DIAGNOSIS — I83891 Varicose veins of right lower extremities with other complications: Secondary | ICD-10-CM | POA: Diagnosis not present

## 2023-03-18 DIAGNOSIS — I872 Venous insufficiency (chronic) (peripheral): Secondary | ICD-10-CM | POA: Diagnosis not present

## 2023-03-18 DIAGNOSIS — Z09 Encounter for follow-up examination after completed treatment for conditions other than malignant neoplasm: Secondary | ICD-10-CM | POA: Diagnosis not present

## 2023-03-18 DIAGNOSIS — I83892 Varicose veins of left lower extremities with other complications: Secondary | ICD-10-CM | POA: Diagnosis not present

## 2023-03-23 DIAGNOSIS — Z09 Encounter for follow-up examination after completed treatment for conditions other than malignant neoplasm: Secondary | ICD-10-CM | POA: Diagnosis not present

## 2023-03-23 DIAGNOSIS — I872 Venous insufficiency (chronic) (peripheral): Secondary | ICD-10-CM | POA: Diagnosis not present

## 2023-04-02 ENCOUNTER — Other Ambulatory Visit: Payer: Self-pay | Admitting: Family Medicine

## 2023-04-02 DIAGNOSIS — I10 Essential (primary) hypertension: Secondary | ICD-10-CM

## 2023-04-16 DIAGNOSIS — I872 Venous insufficiency (chronic) (peripheral): Secondary | ICD-10-CM | POA: Diagnosis not present

## 2023-04-16 DIAGNOSIS — I83893 Varicose veins of bilateral lower extremities with other complications: Secondary | ICD-10-CM | POA: Diagnosis not present

## 2023-05-19 ENCOUNTER — Ambulatory Visit: Payer: Medicare Other | Admitting: Family Medicine

## 2023-05-20 ENCOUNTER — Other Ambulatory Visit: Payer: Self-pay | Admitting: Family Medicine

## 2023-05-20 DIAGNOSIS — I10 Essential (primary) hypertension: Secondary | ICD-10-CM

## 2023-05-20 NOTE — Telephone Encounter (Signed)
Copied from CRM [id number]. Topic: Clinical - Medication Refill >> May 20, 2023  3:11 PM Gildardo Pounds wrote: Most Recent Primary Care Visit:  Provider: Mechele Claude  Department: [facility]  Visit Type: OFFICE VISIT  Date: 02/16/2023  Medication: HYDROcodone-acetaminophen (NORCO/VICODIN) 5-325 MG tablet amLODipine (NORVASC) 10 MG tablet  Has the patient contacted their pharmacy? No (Agent: If no, request that the patient contact the pharmacy for the refill. If patient does not wish to contact the pharmacy document the reason why and proceed with request.) (Agent: If yes, when and what did the pharmacy advise?)  Is this the correct pharmacy for this prescription? Yes If no, delete pharmacy and type the correct one.  This is the patient's preferred pharmacy:  [id number] - [place], [place] - [address] [place] [address] Phone: [phone] Fax: [phone]  Has the prescription been filled recently? No  Is the patient out of the medication? No  Has the patient been seen for an appointment in the last year OR does the patient have an upcoming appointment? yes  Can we respond through MyChart? No  Agent: Please be advised that Rx refills may take up to 3 business days. We ask that you follow-up with your pharmacy.

## 2023-05-20 NOTE — Telephone Encounter (Signed)
Last Fill: Hydrocodone: 02/16/23     Amlodipine: 02/16/23  Last OV: 02/16/23 Next OV: 06/25/23  Routing to provider for review/authorization.

## 2023-05-23 ENCOUNTER — Other Ambulatory Visit: Payer: Self-pay | Admitting: Family Medicine

## 2023-05-23 DIAGNOSIS — I10 Essential (primary) hypertension: Secondary | ICD-10-CM

## 2023-05-26 ENCOUNTER — Other Ambulatory Visit: Payer: Self-pay | Admitting: Family Medicine

## 2023-05-26 NOTE — Telephone Encounter (Signed)
 Last Fill: 02/16/23  Last OV: 02/16/23 Next OV: 06/25/23  Routing to provider for review/authorization.

## 2023-05-26 NOTE — Telephone Encounter (Signed)
 Copied from CRM [id number]. Topic: Clinical - Medication Refill >> May 26, 2023  3:27 PM Gaetano Hawthorne wrote: Most Recent Primary Care Visit:  Provider: Mechele Claude  Department: [facility]  Visit Type: OFFICE VISIT  Date: 02/16/2023  Medication: HYDROcodone-acetaminophen (NORCO/VICODIN) 5-325 MG tablet  Has the patient contacted their pharmacy? No, they need it to go to their mail order pharmacy (Agent: If no, request that the patient contact the pharmacy for the refill. If patient does not wish to contact the pharmacy document the reason why and proceed with request.) (Agent: If yes, when and what did the pharmacy advise?)  Is this the correct pharmacy for this prescription?  If no, delete pharmacy and type the correct one.  This is the patient's preferred pharmacy:  [facility] - [place], [place] - [address] [place] [address] Phone: [phone] Fax: [phone]  Has the prescription been filled recently? No  Is the patient out of the medication? No, patient has a few doses yet.  Has the patient been seen for an appointment in the last year OR does the patient have an upcoming appointment? Yes  Can we respond through MyChart? Yes  Agent: Please be advised that Rx refills may take up to 3 business days. We ask that you follow-up with your pharmacy.

## 2023-05-26 NOTE — Telephone Encounter (Signed)
 Copied from CRM [id number]. Topic: Clinical - Medication Refill >> May 26, 2023  3:27 PM Gaetano Hawthorne wrote: Most Recent Primary Care Visit:  Provider: Mechele Claude  Department: [facility]  Visit Type: OFFICE VISIT  Date: 02/16/2023  Medication: HYDROcodone-acetaminophen (NORCO/VICODIN) 5-325 MG tablet  Has the patient contacted their pharmacy? No, they need it to go to their mail order pharmacy (Agent: If no, request that the patient contact the pharmacy for the refill. If patient does not wish to contact the pharmacy document the reason why and proceed with request.) (Agent: If yes, when and what did the pharmacy advise?)  Is this the correct pharmacy for this prescription? No If no, delete pharmacy and type the correct one.  This is the patient's preferred pharmacy:  [facility] - [place], [place] - [address] [place] [address] Phone: [phone] Fax: [phone]  Has the prescription been filled recently? No  Is the patient out of the medication? No, patient has a few doses yet.  Has the patient been seen for an appointment in the last year OR does the patient have an upcoming appointment? Yes  Can we respond through MyChart? Yes  Agent: Please be advised that Rx refills may take up to 3 business days. We ask that you follow-up with your pharmacy.

## 2023-06-08 ENCOUNTER — Other Ambulatory Visit: Payer: Self-pay | Admitting: Family Medicine

## 2023-06-08 ENCOUNTER — Ambulatory Visit: Payer: Self-pay | Admitting: Family Medicine

## 2023-06-08 ENCOUNTER — Telehealth: Payer: Self-pay

## 2023-06-08 NOTE — Telephone Encounter (Signed)
 Copied from CRM [id number]. Topic: General - Other >> Jun 08, 2023  4:31 PM Emylou G wrote: Reason for CRM: eric called [phone].Marland Kitchen adv she needs to be seen sooner - due to out of medication.  The doctor canceled on her he said but its important to get in and needs to be seen sooner

## 2023-06-08 NOTE — Telephone Encounter (Signed)
Pt. Needs to be seen for this. Thanks, WS 

## 2023-06-08 NOTE — Telephone Encounter (Signed)
 Pt's son Anita Perez calling in to request a refill of pt's hydrocodone-acetaminophen. Anita Perez is not with the pt at this time. Anita Perez states pt reports pain in her legs and feet, which is what the medication is prescribed for. Pt denies any new symptoms. Anita Perez states [employer] sends medication to the pt. RN advised Anita Perez she would relay the request for follow-up.  Copied from CRM [id number]. Topic: Clinical - Medication Refill >> Jun 08, 2023 10:15 AM Nyra Capes wrote: Anita Perez, patient son calling in for patient wanting medication refill  Most Recent Primary Care Visit: Dr Darlyn Read on 02/16/2023   Medication: HYDROcodone-acetaminophen (NORCO/VICODIN) 5-325 MG tablet  Has the patient contacted their pharmacy? Yes (Agent: If no, request that the patient contact the pharmacy for the refill. If patient does not wish to contact the pharmacy document the reason why and proceed with request.) (Agent: If yes, when and what did the pharmacy advise?)  Is this the correct pharmacy for this prescription? No  [employer] sends medication to patient    Has the prescription been filled recently? Yes  Is the patient out of the medication? Yes  Has the patient been seen for an appointment in the last year OR does the patient have an upcoming appointment? Yes  June 25, 2023 at 1:25pm  Can we respond through MyChart? No  Agent: Please be advised that Rx refills may take up to 3 business days. We ask that you follow-up with your pharmacy. Reason for Disposition  [1] Prescription refill request for NON-ESSENTIAL medicine (i.e., no harm to patient if med not taken) AND [2] triager unable to refill per department policy  Answer Assessment - Initial Assessment Questions 1. DRUG NAME: "What medicine do you need to have refilled?"     Hydrocodone-acetaminophen 2. REFILLS REMAINING: "How many refills are remaining?" (Note: The label on the medicine or pill bottle will show how many refills are remaining. If there are no refills  remaining, then a renewal may be needed.)     0 3. EXPIRATION DATE: "What is the expiration date?" (Note: The label states when the prescription will expire, and thus can no longer be refilled.)     08/15/23 4. PRESCRIBING HCP: "Who prescribed it?" Reason: If prescribed by specialist, call should be referred to that group.     [facility] 5. SYMPTOMS: "Do you have any symptoms?"     Pain in the legs and feet  Protocols used: Medication Refill and Renewal Call-A-AH

## 2023-06-08 NOTE — Telephone Encounter (Signed)
 Called, pt's daughter not available and she does scheduling. She will call back to schedule. LS

## 2023-06-08 NOTE — Telephone Encounter (Signed)
 Copied from CRM [id number]. Topic: Clinical - Medication Refill >> Jun 08, 2023 10:15 AM Nyra Capes wrote: Lorna Few, patient son calling in for patient wanting medication refill  Most Recent Primary Care Visit: Dr Darlyn Read on 02/16/2023   Medication: HYDROcodone-acetaminophen (NORCO/VICODIN) 5-325 MG tablet  Has the patient contacted their pharmacy? Yes (Agent: If no, request that the patient contact the pharmacy for the refill. If patient does not wish to contact the pharmacy document the reason why and proceed with request.) (Agent: If yes, when and what did the pharmacy advise?)  Is this the correct pharmacy for this prescription? No  [employer] sends medication to patient    Has the prescription been filled recently? Yes  Is the patient out of the medication? Yes  Has the patient been seen for an appointment in the last year OR does the patient have an upcoming appointment? Yes  June 25, 2023 at 1:25pm  Can we respond through MyChart? No  Agent: Please be advised that Rx refills may take up to 3 business days. We ask that you follow-up with your pharmacy.

## 2023-06-08 NOTE — Telephone Encounter (Signed)
 Patient needs to be seen for an appointment for med refill. Per chart review, daughter is in charge of scheduling and need to call back to schedule.

## 2023-06-09 NOTE — Telephone Encounter (Signed)
 2nd attempt. LM on vm please forward directly to me so I can put pt in a same day slot for this week. Needs to be seen soon. LS

## 2023-06-10 NOTE — Telephone Encounter (Signed)
 On scheduled for tomorrow. LS

## 2023-06-10 NOTE — Telephone Encounter (Signed)
 R/C to Leah.

## 2023-06-11 ENCOUNTER — Ambulatory Visit (INDEPENDENT_AMBULATORY_CARE_PROVIDER_SITE_OTHER): Admitting: Family Medicine

## 2023-06-11 ENCOUNTER — Encounter: Payer: Self-pay | Admitting: Family Medicine

## 2023-06-11 ENCOUNTER — Ambulatory Visit: Admitting: Family Medicine

## 2023-06-11 VITALS — BP 138/72 | HR 72 | Temp 97.9°F | Ht 62.0 in | Wt 126.0 lb

## 2023-06-11 DIAGNOSIS — I10 Essential (primary) hypertension: Secondary | ICD-10-CM

## 2023-06-11 DIAGNOSIS — G629 Polyneuropathy, unspecified: Secondary | ICD-10-CM | POA: Diagnosis not present

## 2023-06-11 DIAGNOSIS — G2581 Restless legs syndrome: Secondary | ICD-10-CM | POA: Diagnosis not present

## 2023-06-11 DIAGNOSIS — M5416 Radiculopathy, lumbar region: Secondary | ICD-10-CM | POA: Diagnosis not present

## 2023-06-11 MED ORDER — HYDROCODONE-ACETAMINOPHEN 5-325 MG PO TABS: 1.0000 | ORAL_TABLET | Freq: Three times a day (TID) | ORAL | 0 refills | Status: DC | PRN

## 2023-06-11 MED ORDER — GABAPENTIN 600 MG PO TABS: 1200.0000 mg | ORAL_TABLET | Freq: Two times a day (BID) | ORAL | 1 refills | Status: DC

## 2023-06-11 MED ORDER — AMLODIPINE BESYLATE 10 MG PO TABS: 10.0000 mg | ORAL_TABLET | Freq: Every day | ORAL | 0 refills | Status: DC

## 2023-06-11 MED ORDER — PANTOPRAZOLE SODIUM 40 MG PO TBEC: 40.0000 mg | DELAYED_RELEASE_TABLET | Freq: Every day | ORAL | 1 refills | Status: AC

## 2023-06-11 NOTE — Progress Notes (Signed)
 Subjective:  Patient ID: Anita Perez, female    DOB: [birth year]  Age: 88 y.o. MRN: [id number]  CC: Medication Refill (pended) and Pain (Pain all over since falling. Legs pain, feet, on her sides. Nurse can to house and looked at her. Pt fell backwards. Ottis Stain meds for  10 days to [facility] and rest through mail order if possible?)   HPI Anita Perez presents for pain meds due to lumbar neuropathy. She was out of meds and pain increased mostly in the legs. Also has restless legs felt as tingling in the feet and legs.    presents for  follow-up of hypertension. Patient has no history of headache chest pain or shortness of breath or recent cough. Patient also denies symptoms of TIA such as focal numbness or weakness. Patient denies side effects from medication. States taking it regularly.      06/11/2023    1:27 PM 02/16/2023    1:47 PM 01/01/2023   12:59 PM  Depression screen PHQ 2/9  Decreased Interest 2 0 0  Down, Depressed, Hopeless 0 0 0  PHQ - 2 Score 2 0 0  Altered sleeping 1    Tired, decreased energy 1    Change in appetite 1    Feeling bad or failure about yourself  1    Trouble concentrating 1    Moving slowly or fidgety/restless 1    Suicidal thoughts 1    PHQ-9 Score 9    Difficult doing work/chores Not difficult at all      [name] has a past medical history of Arthritis, Colon polyps, DDD (degenerative disc disease), lumbar, Diverticulitis (1990s), GERD (gastroesophageal reflux disease), Hiatal hernia, Hyperlipidemia, Hypertension, Osteopenia, Rectal carcinoma (HCC), SBO (small bowel obstruction) (HCC) (04/25/2016), and Small bowel obstruction (HCC) (04/25/2016).   She has a past surgical history that includes intestinal  tear; Appendectomy; Cosmetic surgery (Left); Lumbar laminectomy/decompression microdiscectomy (Left, 06/14/2013); Knee arthroscopy (Right); Tumor removal (Left); Back surgery; Breast cyst excision (Left); Colostomy (1990s); Colostomy takedown  (1990s); Colonoscopy w/ biopsies and polypectomy; and Colon surgery.   Her family history includes Cancer in her daughter, sister, and sister; Early death in her brother; Hypertension in her father and mother; Kidney disease in her father and mother.She reports that she quit smoking about 40 years ago. Her smoking use included cigarettes. She started smoking about 65 years ago. She has a 2.9 pack-year smoking history. She quit smokeless tobacco use about 77 years ago.  Her smokeless tobacco use included snuff. She reports that she does not currently use alcohol. She reports that she does not use drugs.    ROS Review of Systems  Constitutional: Negative.   HENT:  Negative for congestion.   Eyes:  Negative for visual disturbance.  Respiratory:  Negative for shortness of breath.   Cardiovascular:  Negative for chest pain.  Gastrointestinal:  Negative for abdominal pain, constipation, diarrhea, nausea and vomiting.  Genitourinary:  Negative for difficulty urinating.  Musculoskeletal:  Positive for myalgias (pain in legs). Negative for arthralgias.  Neurological:  Negative for tremors, numbness and headaches.       Tingling in  feet   Psychiatric/Behavioral:  Negative for sleep disturbance.     Objective:  BP 138/72   Pulse 72   Temp 97.9 F (36.6 C)   Ht 5\' 2"  (1.575 m)   Wt 126 lb (57.2 kg)   SpO2 97%   BMI 23.05 kg/m   BP Readings from Last 3 Encounters:  06/11/23 138/72  02/16/23 (!) 150/67  01/01/23 (!) 175/94    Wt Readings from Last 3 Encounters:  06/11/23 126 lb (57.2 kg)  02/16/23 126 lb 12.8 oz (57.5 kg)  01/01/23 128 lb (58.1 kg)     Physical Exam Constitutional:      General: She is not in acute distress.    Appearance: She is well-developed.  Cardiovascular:     Rate and Rhythm: Normal rate and regular rhythm.  Pulmonary:     Breath sounds: Normal breath sounds.  Musculoskeletal:        General: Normal range of motion.  Skin:    General: Skin is warm  and dry.  Neurological:     Mental Status: She is alert and oriented to person, place, and time.       Assessment & Plan:   Anita Perez was seen today for medication refill and pain.  Diagnoses and all orders for this visit:  Accelerated hypertension -     amLODipine (NORVASC) 10 MG tablet; Take 1 tablet (10 mg total) by mouth daily.  Polyneuropathy -     gabapentin (NEURONTIN) 600 MG tablet; Take 2 tablets (1,200 mg total) by mouth 2 (two) times daily.  Other orders -     HYDROcodone-acetaminophen (NORCO/VICODIN) 5-325 MG tablet; Take 1 tablet by mouth 3 (three) times daily as needed for moderate pain (pain score 4-6). -     pantoprazole (PROTONIX) 40 MG tablet; Take 1 tablet (40 mg total) by mouth daily. For stomach -     HYDROcodone-acetaminophen (NORCO/VICODIN) 5-325 MG tablet; Take 1 tablet by mouth 3 (three) times daily as needed for moderate pain (pain score 4-6). -     HYDROcodone-acetaminophen (NORCO/VICODIN) 5-325 MG tablet; Take 1 tablet by mouth 3 (three) times daily as needed for moderate pain (pain score 4-6).       I have changed Anita Perez's amLODipine and gabapentin. I am also having her start on HYDROcodone-acetaminophen and HYDROcodone-acetaminophen. Additionally, I am having her maintain her aspirin, Calcium Carbonate-Vitamin D (CALCIUM + D PO), Vitamin D, multivitamin, polyethylene glycol powder, Alpha-Lipoic Acid, celecoxib, atorvastatin, meclizine, raloxifene, alendronate, megestrol, HYDROcodone-acetaminophen, and pantoprazole.  Allergies as of 06/11/2023       Reactions   Diclofenac    SOB, nervous   Lyrica [pregabalin]    Pepto-bismol [bismuth] Other (See Comments)   Turned mouth black inside   Ropinirole Other (See Comments)   Jittery        Medication List        Accurate as of June 11, 2023  1:50 PM. If you have any questions, ask your nurse or doctor.          alendronate 70 MG tablet Commonly known as: FOSAMAX Take 1 tablet  (70 mg total) by mouth every 7 (seven) days. Take with a full glass of water on an empty stomach.   Alpha-Lipoic Acid 200 MG Tabs Take by mouth.   amLODipine 10 MG tablet Commonly known as: NORVASC Take 1 tablet (10 mg total) by mouth daily.   aspirin 81 MG tablet Take 81 mg by mouth daily.   atorvastatin 80 MG tablet Commonly known as: LIPITOR Take 1 tablet (80 mg total) by mouth daily.   CALCIUM + D PO Take 600 mg by mouth daily.   celecoxib 400 MG capsule Commonly known as: CeleBREX Take 1 capsule (400 mg total) by mouth daily. With food   gabapentin 600 MG tablet Commonly known as: NEURONTIN Take 2 tablets (1,200 mg total) by  mouth 2 (two) times daily. What changed:  how much to take when to take this Changed by: Broadus John Broderick Fonseca   HYDROcodone-acetaminophen 5-325 MG tablet Commonly known as: NORCO/VICODIN Take 1 tablet by mouth 3 (three) times daily as needed for moderate pain (pain score 4-6). What changed: Another medication with the same name was added. Make sure you understand how and when to take each. Changed by: Broadus John Ayvion Kavanagh   HYDROcodone-acetaminophen 5-325 MG tablet Commonly known as: NORCO/VICODIN Take 1 tablet by mouth 3 (three) times daily as needed for moderate pain (pain score 4-6). Start taking on: July 11, 2023 What changed: You were already taking a medication with the same name, and this prescription was added. Make sure you understand how and when to take each. Changed by: Broadus John Jaja Switalski   HYDROcodone-acetaminophen 5-325 MG tablet Commonly known as: NORCO/VICODIN Take 1 tablet by mouth 3 (three) times daily as needed for moderate pain (pain score 4-6). Start taking on: Aug 10, 2023 What changed: You were already taking a medication with the same name, and this prescription was added. Make sure you understand how and when to take each. Changed by: Ahnna Dungan   meclizine 12.5 MG tablet Commonly known as: ANTIVERT Take 0.5 tablets (6.25 mg  total) by mouth 3 (three) times daily as needed for dizziness.   megestrol 400 MG/10ML suspension Commonly known as: MEGACE Take 10 mLs (400 mg total) by mouth 2 (two) times daily. For appetite stimulation   multivitamin tablet Take 1 tablet by mouth daily.   pantoprazole 40 MG tablet Commonly known as: PROTONIX Take 1 tablet (40 mg total) by mouth daily. For stomach   polyethylene glycol powder 17 GM/SCOOP powder Commonly known as: GLYCOLAX/MIRALAX DISSOLVE 17 GRAMS IN 8 OZ OF FLUID LIQUID DRINK DAILY AS DIRECTED   raloxifene 60 MG tablet Commonly known as: EVISTA TAKE 1 TABLET ONCE DAILY (FOR BONE HEALTH AND BREAST CANCER PREVENTION)   Vitamin D 50 MCG (2000 UT) Caps Take 2,000 Units by mouth daily.         Follow-up: Return in about 3 months (around 09/11/2023).  Mechele Claude, M.D.

## 2023-06-23 ENCOUNTER — Telehealth: Payer: Self-pay | Admitting: Family Medicine

## 2023-06-23 NOTE — Telephone Encounter (Signed)
 Copied from CRM [id number]. Topic: Clinical - Medication Question >> Jun 23, 2023  2:33 PM Carlatta H wrote: Reason for CRM: Patients son called to see if he can have some recommendations for ear ringing//Please call Minerva Areola at [phone]

## 2023-06-23 NOTE — Telephone Encounter (Signed)
 Caused byhearing loss. No treatment available.

## 2023-06-24 ENCOUNTER — Ambulatory Visit (INDEPENDENT_AMBULATORY_CARE_PROVIDER_SITE_OTHER): Admitting: Family Medicine

## 2023-06-24 ENCOUNTER — Encounter: Payer: Self-pay | Admitting: Family Medicine

## 2023-06-24 VITALS — BP 142/75 | Temp 97.5°F | Ht 62.0 in | Wt 128.0 lb

## 2023-06-24 DIAGNOSIS — H9313 Tinnitus, bilateral: Secondary | ICD-10-CM

## 2023-06-24 MED ORDER — NEOMYCIN-POLYMYXIN-HC 3.5-10000-1 OT SOLN: 4.0000 [drp] | Freq: Four times a day (QID) | OTIC | 2 refills | Status: AC

## 2023-06-24 NOTE — Progress Notes (Signed)
 Subjective:  Patient ID: Anita Perez, female    DOB: June 19, 1935  Age: 88 y.o. MRN: [id number]  CC: Tinnitus (Constant ringing since yesterday in both ears. No pain. Pt brought meds that she is wanting. Started feeling dizzy yesterday as well and thinks it might be related. Feels off balance when walking. )   HPI [name] presents for onset yesterday of a noise in the ears (AU) like a hissing, constant sound. No known trigger Had been treated previously for vertigo. Has a bit of orthostasis she describes currently, but no vertigo. 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  She denies pain drainage and loss of hearing.  No upper respiratory symptoms.  She brings in an herbal remedy that consist primarily B vitamins plus vitamin C and calcium that is named tinnitus relief.  She wants to try that.  Additionally she brings in a note from her sister-in-law asking about meclizine and hydroxyzine versus Cortisporin otic.     06/24/2023    3:37 PM 06/11/2023    1:27 PM 02/16/2023    1:47 PM  Depression screen PHQ 2/9  Decreased Interest 1 2 0  Down, Depressed, Hopeless 0 0 0  PHQ - 2 Score 1 2 0  Altered sleeping 0 1   Tired, decreased energy 0 1   Change in appetite 0 1   Feeling bad or failure about yourself  0 1   Trouble concentrating 0 1   Moving slowly or fidgety/restless 0 1   Suicidal thoughts 0 1   PHQ-9 Score 1 9   Difficult doing work/chores Not difficult at all Not difficult at all     History Tammala has a past medical history of Arthritis, Colon polyps, DDD (degenerative disc disease), lumbar, Diverticulitis (1990s), GERD (gastroesophageal reflux disease), Hiatal hernia, Hyperlipidemia, Hypertension, Osteopenia, Rectal carcinoma (HCC), SBO (small bowel obstruction) (HCC) (04/25/2016), and Small bowel obstruction (HCC) (04/25/2016).   She has a past surgical history that includes intestinal  tear; Appendectomy; Cosmetic surgery (Left);  Lumbar laminectomy/decompression microdiscectomy (Left, 06/14/2013); Knee arthroscopy (Right); Tumor removal (Left); Back surgery; Breast cyst excision (Left); Colostomy (1990s); Colostomy takedown (1990s); Colonoscopy w/ biopsies and polypectomy; and Colon surgery.   Her family history includes Cancer in her daughter, sister, and sister; Early death in her brother; Hypertension in her father and mother; Kidney disease in her father and mother.She reports that she quit smoking about 41 years ago. Her smoking use included cigarettes. She started smoking about 65 years ago. She has a 2.9 pack-year smoking history. She quit smokeless tobacco use about 77 years ago.  Her smokeless tobacco use included snuff. She reports that she does not currently use alcohol. She reports that she does not use drugs.    ROS Review of Systems  Constitutional: Negative.   HENT: Negative.    Eyes:  Negative for visual disturbance.  Respiratory:  Negative for shortness of breath.   Cardiovascular:  Negative for chest pain.  Gastrointestinal:  Negative for abdominal pain.  Musculoskeletal:  Negative for arthralgias.    Objective:  BP (!) 142/75   Temp (!) 97.5 F (36.4 C)   Ht 5\' 2"  (1.575 m)   Wt 128 lb (58.1 kg)   BMI 23.41 kg/m   BP Readings from Last 3 Encounters:  06/24/23 (!) 142/75  06/11/23 138/72  02/16/23 (!) 150/67    Wt Readings from Last 3 Encounters:  06/24/23 128 lb (58.1 kg)  06/11/23 126  lb (57.2 kg)  02/16/23 126 lb 12.8 oz (57.5 kg)     Physical Exam Constitutional:      General: She is not in acute distress.    Appearance: She is well-developed.  HENT:     Right Ear: Tympanic membrane normal.     Left Ear: Tympanic membrane normal.  Cardiovascular:     Rate and Rhythm: Normal rate and regular rhythm.  Pulmonary:     Breath sounds: Normal breath sounds.  Musculoskeletal:        General: Normal range of motion.  Skin:    General: Skin is warm and dry.  Neurological:      Mental Status: She is alert and oriented to person, place, and time.      Assessment & Plan:  Tinnitus aurium, bilateral  Other orders -     Neomycin-Polymyxin-HC; Place 4 drops into both ears 4 (four) times daily.  Dispense: 10 mL; Refill: 2     Follow-up: No follow-ups on file.  Mechele Claude, M.D.

## 2023-06-24 NOTE — Telephone Encounter (Signed)
 Pt reports dizziness as well. Wants to be seen. Placed on DOD.

## 2023-06-25 ENCOUNTER — Ambulatory Visit: Payer: Medicare Other | Admitting: Family Medicine

## 2023-07-13 ENCOUNTER — Other Ambulatory Visit: Payer: Self-pay | Admitting: Family Medicine

## 2023-07-13 ENCOUNTER — Ambulatory Visit
Admission: RE | Admit: 2023-07-13 | Discharge: 2023-07-13 | Disposition: A | Discharge status: Home / Self Care | Source: Ambulatory Visit | Attending: Family Medicine | Admitting: Family Medicine

## 2023-07-13 DIAGNOSIS — Z1231 Encounter for screening mammogram for malignant neoplasm of breast: Secondary | ICD-10-CM

## 2023-07-15 DIAGNOSIS — I83891 Varicose veins of right lower extremities with other complications: Secondary | ICD-10-CM | POA: Diagnosis not present

## 2023-08-06 DIAGNOSIS — M5416 Radiculopathy, lumbar region: Secondary | ICD-10-CM | POA: Diagnosis not present

## 2023-08-28 ENCOUNTER — Other Ambulatory Visit: Payer: Self-pay | Admitting: Family Medicine

## 2023-08-28 DIAGNOSIS — I10 Essential (primary) hypertension: Secondary | ICD-10-CM

## 2023-09-14 ENCOUNTER — Encounter: Payer: Self-pay | Admitting: Family Medicine

## 2023-09-14 ENCOUNTER — Ambulatory Visit (INDEPENDENT_AMBULATORY_CARE_PROVIDER_SITE_OTHER): Admitting: Family Medicine

## 2023-09-14 VITALS — BP 158/83 | HR 76 | Temp 97.5°F | Ht 62.0 in | Wt 123.0 lb

## 2023-09-14 DIAGNOSIS — I10 Essential (primary) hypertension: Secondary | ICD-10-CM | POA: Diagnosis not present

## 2023-09-14 DIAGNOSIS — G629 Polyneuropathy, unspecified: Secondary | ICD-10-CM

## 2023-09-14 DIAGNOSIS — E785 Hyperlipidemia, unspecified: Secondary | ICD-10-CM | POA: Diagnosis not present

## 2023-09-14 DIAGNOSIS — R5383 Other fatigue: Secondary | ICD-10-CM

## 2023-09-14 DIAGNOSIS — M5416 Radiculopathy, lumbar region: Secondary | ICD-10-CM

## 2023-09-14 MED ORDER — HYDROCODONE-ACETAMINOPHEN 5-325 MG PO TABS: 1.0000 | ORAL_TABLET | Freq: Three times a day (TID) | ORAL | 0 refills | Status: DC | PRN

## 2023-09-14 MED ORDER — GABAPENTIN 600 MG PO TABS: 1200.0000 mg | ORAL_TABLET | Freq: Two times a day (BID) | ORAL | 1 refills | Status: AC

## 2023-09-14 NOTE — Progress Notes (Unsigned)
 Subjective:  Patient ID: Anita Perez, female    DOB: 1936-02-19  Age: 88 y.o. MRN: [id number]  CC: Medical Management of Chronic Issues, lymph node (Knot in throat for one week. Not sore. ), and Pain (Legs and feet. Pain due to neuropathy. Medication helps but not much. Hard to walk in the mornings especially. )   HPI Anita Perez presents for patient in for follow-up of elevated cholesterol. Doing well without complaints on current medication. Denies side effects of statin including myalgia and arthralgia and nausea. Also in today for liver function testing. Currently no chest pain, shortness of breath or other cardiovascular related symptoms noted.  Follow-up of hypertension. Patient has no history of headache chest pain or shortness of breath or recent cough. Patient also denies symptoms of TIA such as numbness weakness lateralizing. Patient checks  blood pressure at home and has not had any elevated readings recently. Patient denies side effects from his medication. States taking it regularly.  [name] is actually taking the gabapentin  appropriately as her statement about how she is taking it does not line up with the PDMP for how it has been filled filled.  ent states that she has ongoing moderately severe pain for lumbar neuropathy and radiculopathy.        06/24/2023    3:37 PM 06/11/2023    1:27 PM 02/16/2023    1:47 PM  Depression screen PHQ 2/9  Decreased Interest 1 2 0  Down, Depressed, Hopeless 0 0 0  PHQ - 2 Score 1 2 0  Altered sleeping 0 1   Tired, decreased energy 0 1   Change in appetite 0 1   Feeling bad or failure about yourself  0 1   Trouble concentrating 0 1   Moving slowly or fidgety/restless 0 1   Suicidal thoughts 0 1   PHQ-9 Score 1 9   Difficult doing work/chores Not difficult at all Not difficult at all     [name] has a past medical history of Arthritis, Colon polyps, DDD (degenerative disc disease), lumbar, Diverticulitis (1990s), GERD  (gastroesophageal reflux disease), Hiatal hernia, Hyperlipidemia, Hypertension, Osteopenia, Rectal carcinoma (HCC), SBO (small bowel obstruction) (HCC) (04/25/2016), and Small bowel obstruction (HCC) (04/25/2016).   She has a past surgical history that includes intestinal  tear; Appendectomy; Cosmetic surgery (Left); Lumbar laminectomy/decompression microdiscectomy (Left, 06/14/2013); Knee arthroscopy (Right); Tumor removal (Left); Back surgery; Breast cyst excision (Left); Colostomy (1990s); Colostomy takedown (1990s); Colonoscopy w/ biopsies and polypectomy; and Colon surgery.   Her family history includes Cancer in her daughter, sister, and sister; Early death in her brother; Hypertension in her father and mother; Kidney disease in her father and mother.She reports that she quit smoking about 41 years ago. Her smoking use included cigarettes. She started smoking about 65 years ago. She has a 2.9 pack-year smoking history. She quit smokeless tobacco use about 77 years ago.  Her smokeless tobacco use included snuff. She reports that she does not currently use alcohol. She reports that she does not use drugs.    ROS Review of Systems  Constitutional: Negative.   HENT: Negative.    Eyes:  Negative for visual disturbance.  Respiratory:  Negative for shortness of breath.   Cardiovascular:  Negative for chest pain.  Gastrointestinal:  Negative for abdominal pain.  Musculoskeletal:  Positive for arthralgias.    Objective:  BP (!) 158/83   Pulse 76   Temp (!) 97.5 F (36.4 C)   Ht 5' 2 (1.575 m)  Wt 123 lb (55.8 kg)   SpO2 96%   BMI 22.50 kg/m   BP Readings from Last 3 Encounters:  09/14/23 (!) 158/83  06/24/23 (!) 142/75  06/11/23 138/72    Wt Readings from Last 3 Encounters:  09/14/23 123 lb (55.8 kg)  06/24/23 128 lb (58.1 kg)  06/11/23 126 lb (57.2 kg)     Physical Exam Constitutional:      General: She is not in acute distress.    Appearance: She is well-developed.    Cardiovascular:     Rate and Rhythm: Normal rate and regular rhythm.  Pulmonary:     Breath sounds: Normal breath sounds.   Musculoskeletal:        General: Normal range of motion.   Skin:    General: Skin is warm and dry.   Neurological:     Mental Status: She is alert and oriented to person, place, and time.      Assessment & Plan:  Essential hypertension, benign -     CBC with Differential/Platelet  Hyperlipidemia with target LDL less than 100 -     CMP14+EGFR -     Lipid panel  Other fatigue -     CBC with Differential/Platelet -     CMP14+EGFR  Lumbar radiculopathy -     HYDROcodone -Acetaminophen ; Take 1 tablet by mouth 3 (three) times daily as needed for moderate pain (pain score 4-6).  Dispense: 90 tablet; Refill: 0 -     HYDROcodone -Acetaminophen ; Take 1 tablet by mouth 3 (three) times daily as needed for moderate pain (pain score 4-6).  Dispense: 90 tablet; Refill: 0 -     HYDROcodone -Acetaminophen ; Take 1 tablet by mouth 3 (three) times daily as needed for moderate pain (pain score 4-6).  Dispense: 90 tablet; Refill: 0  Polyneuropathy -     Gabapentin ; Take 2 tablets (1,200 mg total) by mouth 2 (two) times daily.  Dispense: 360 tablet; Refill: 1     Follow-up: Return in about 3 months (around 12/15/2023).  [name], M.D.

## 2023-09-15 ENCOUNTER — Ambulatory Visit: Payer: Self-pay

## 2023-09-15 ENCOUNTER — Ambulatory Visit: Payer: Self-pay | Admitting: Family Medicine

## 2023-09-15 ENCOUNTER — Encounter: Payer: Self-pay | Admitting: Family Medicine

## 2023-09-15 LAB — CBC WITH DIFFERENTIAL/PLATELET
Basophils Absolute: 0.1 10*3/uL (ref 0.0–0.2)
Basos: 2 %
EOS (ABSOLUTE): 0.8 10*3/uL — ABNORMAL HIGH (ref 0.0–0.4)
Eos: 10 %
Hematocrit: 34.6 % (ref 34.0–46.6)
Hemoglobin: 10.3 g/dL — ABNORMAL LOW (ref 11.1–15.9)
Immature Grans (Abs): 0 10*3/uL (ref 0.0–0.1)
Immature Granulocytes: 0 %
Lymphocytes Absolute: 2.1 10*3/uL (ref 0.7–3.1)
Lymphs: 28 %
MCH: 26.1 pg — ABNORMAL LOW (ref 26.6–33.0)
MCHC: 29.8 g/dL — ABNORMAL LOW (ref 31.5–35.7)
MCV: 88 fL (ref 79–97)
Monocytes Absolute: 0.7 10*3/uL (ref 0.1–0.9)
Monocytes: 9 %
Neutrophils Absolute: 3.7 10*3/uL (ref 1.4–7.0)
Neutrophils: 51 %
Platelets: 265 10*3/uL (ref 150–450)
RBC: 3.95 x10E6/uL (ref 3.77–5.28)
RDW: 15.1 % (ref 11.7–15.4)
WBC: 7.4 10*3/uL (ref 3.4–10.8)

## 2023-09-15 LAB — LIPID PANEL
Chol/HDL Ratio: 3.8 ratio (ref 0.0–4.4)
Cholesterol, Total: 204 mg/dL — ABNORMAL HIGH (ref 100–199)
HDL: 53 mg/dL (ref >39)
LDL Chol Calc (NIH): 137 mg/dL — ABNORMAL HIGH (ref 0–99)
Triglycerides: 78 mg/dL (ref 0–149)
VLDL Cholesterol Cal: 14 mg/dL (ref 5–40)

## 2023-09-15 LAB — CMP14+EGFR
ALT: 11 IU/L (ref 0–32)
AST: 16 IU/L (ref 0–40)
Albumin: 3.8 g/dL (ref 3.7–4.7)
Alkaline Phosphatase: 67 IU/L (ref 44–121)
BUN/Creatinine Ratio: 27 (ref 12–28)
BUN: 21 mg/dL (ref 8–27)
Bilirubin Total: 0.2 mg/dL (ref 0.0–1.2)
CO2: 23 mmol/L (ref 20–29)
Calcium: 9.1 mg/dL (ref 8.7–10.3)
Chloride: 106 mmol/L (ref 96–106)
Creatinine, Ser: 0.78 mg/dL (ref 0.57–1.00)
Globulin, Total: 2.4 g/dL (ref 1.5–4.5)
Glucose: 86 mg/dL (ref 70–99)
Potassium: 4.5 mmol/L (ref 3.5–5.2)
Sodium: 142 mmol/L (ref 134–144)
Total Protein: 6.2 g/dL (ref 6.0–8.5)
eGFR: 74 mL/min/{1.73_m2} (ref >59)

## 2023-09-15 NOTE — Telephone Encounter (Signed)
 Concerned about lab. This is a rport about a blood draw that had a complication. Thanks

## 2023-09-15 NOTE — Telephone Encounter (Signed)
 FYI Only or Action Required?: FYI only for provider  Patient was last seen in primary care on 09/14/2023 by [name], MD. Called Nurse Triage reporting Bleeding/Bruising. Symptoms began yesterday. Interventions attempted: Ice/heat application. Symptoms are: stable.  Triage Disposition: Home Care  Patient/caregiver understands and will follow disposition?: Yes Pt states she placed ice on area last night and swelling and discoloration is better today but was still concerned since hadn't happened before.   Copied from CRM [id number]. Topic: Clinical - Red Word Triage >> Sep 15, 2023  1:03 PM Elle L wrote: Red Word that prompted transfer to Nurse Triage: The patient states that Dr. Veleta Gerold sent her to the lab yesterday after her appointment for blood work and now she has a big knot on her arm that is blue and painful. Reason for Disposition  Minor bruising at prior IV site  Answer Assessment - Initial Assessment Questions 1. SYMPTOM:  What's the main symptom you're concerned about? (e.g., pain, redness, swelling, pus)     Knot and blueness/redness  2. ONSET: When did the sx  start?     Yesterday  3. IV TYPE: What kind of IV line do you have? (e.g., central line, PICC, peripheral IV)     IV blood draw  4. IV LOCATION - SITE: Where does the IV enter your body?     R ante  8. PAIN: Is there any pain? If Yes, ask: How bad is the pain? (e.g., scale 1-10; or mild, moderate, severe) Describe the pain. (e.g., burning, throbbing, shooting, sharp, etc.)   - NONE (0): no pain   - MILD (1-3): doesn't interfere with normal activities    - MODERATE (4-7): interferes with normal activities or awakens from sleep    - SEVERE (8-10): excruciating pain, unable to do any normal activities      Mild  9. SWELLING: Is there any swelling at your IV site?      Small amount 10. FEVER: Do you have a fever? If Yes, ask: What is your temperature, how was it measured, and when did it start?         no 11. OTHER SYMPTOMS: Do you have any other symptoms? (e.g., shaking chills, weakness)       no  Protocols used: IV Site and Other Symptoms-A-AH

## 2023-11-24 ENCOUNTER — Ambulatory Visit: Payer: Self-pay

## 2023-11-24 NOTE — Telephone Encounter (Signed)
 FYI Only or Action Required?: FYI only for provider.  Patient was last seen in primary care on 09/14/2023 by Zollie Lowers, MD.  Called Nurse Triage reporting Leg Pain.  Symptoms began several weeks ago.  Symptoms are: gradually worsening.  Triage Disposition: See PCP When Office is Open (Within 3 Days)  Patient/caregiver understands and will follow disposition?: Yes        Copied from CRM #[id number]. Topic: Clinical - Red Word Triage >> Nov 24, 2023  4:13 PM [name] wrote: Red Word that prompted transfer to Nurse Triage: Leg pain.           Reason for Disposition  [1] MODERATE pain (e.g., interferes with normal activities, limping) AND [2] present > 3 days  Answer Assessment - Initial Assessment Questions 1. ONSET: When did the pain start?      3 weeks ago  2. LOCATION: Where is the pain located?      Bilaterals legs and feet  3. PAIN: How bad is the pain?    (Scale 1-10; or mild, moderate, severe)     Moderate to severe  4. WORK OR EXERCISE: Has there been any recent work or exercise that involved this part of the body?      No 5. CAUSE: What do you think is causing the leg pain?     Unsure  6. OTHER SYMPTOMS: Do you have any other symptoms? (e.g., chest pain, back pain, breathing difficulty, swelling, rash, fever, numbness, weakness)     No  Protocols used: Leg Pain-A-AH

## 2023-11-24 NOTE — Telephone Encounter (Signed)
 Noted

## 2023-11-26 ENCOUNTER — Encounter: Payer: Self-pay | Admitting: Family Medicine

## 2023-11-26 ENCOUNTER — Ambulatory Visit (INDEPENDENT_AMBULATORY_CARE_PROVIDER_SITE_OTHER): Admitting: Family Medicine

## 2023-11-26 VITALS — BP 170/71 | HR 73 | Temp 98.6°F | Ht 62.0 in | Wt 128.0 lb

## 2023-11-26 DIAGNOSIS — M67471 Ganglion, right ankle and foot: Secondary | ICD-10-CM | POA: Diagnosis not present

## 2023-11-26 DIAGNOSIS — M19072 Primary osteoarthritis, left ankle and foot: Secondary | ICD-10-CM | POA: Diagnosis not present

## 2023-11-26 DIAGNOSIS — M19071 Primary osteoarthritis, right ankle and foot: Secondary | ICD-10-CM | POA: Diagnosis not present

## 2023-11-26 DIAGNOSIS — I10 Essential (primary) hypertension: Secondary | ICD-10-CM

## 2023-11-26 MED ORDER — NABUMETONE 500 MG PO TABS: 500.0000 mg | ORAL_TABLET | Freq: Two times a day (BID) | ORAL | 1 refills | Status: DC

## 2023-11-26 NOTE — Progress Notes (Signed)
 Subjective:  Patient ID: Anita Perez, female    DOB: 30-Dec-1935  Age: 88 y.o. MRN: [id number]  CC: bilateral leg pain (Below knees + feet/Chronic, has got worse over the last 3 weeks)   HPI   History of Present Illness Anita Perez is an 88 year old female with arthritis who presents with leg and foot pain.  She has been experiencing significant pain in her legs and feet for about three weeks, primarily located in the lateral calf and underneath her toes, described as 'really hurting'. There is stiffness in her legs, particularly in the morning, and increased pain when walking.  She recalls a fall that occurred before her current symptoms began, during which she tried to catch herself to avoid hitting her head. Following the fall, she noticed a knot that has been present for about three weeks. The cyst was initially large but has since decreased in size.  She has a history of arthritis and recalls having an x-ray of her right foot last year. She experiences pain in her feet and knees, particularly in the morning, which makes walking difficult. She has been receiving injections in her back every three to four months, which she believes help with her leg pain.  She is currently taking amlodipine  daily for blood pressure management. She has a history of using appetite stimulants, though she is not currently taking them. She reports a good appetite and receives food from her son, who is a Financial risk analyst.          11/26/2023    2:32 PM 06/24/2023    3:37 PM 06/11/2023    1:27 PM  Depression screen PHQ 2/9  Decreased Interest 1 1 2   Down, Depressed, Hopeless 0 0 0  PHQ - 2 Score 1 1 2   Altered sleeping 1 0 1  Tired, decreased energy 0 0 1  Change in appetite 0 0 1  Feeling bad or failure about yourself  0 0 1  Trouble concentrating 0 0 1  Moving slowly or fidgety/restless 0 0 1  Suicidal thoughts 0 0 1  PHQ-9 Score 2 1 9   Difficult doing work/chores Not difficult at all Not difficult at  all Not difficult at all    [name] has a past medical history of Arthritis, Colon polyps, DDD (degenerative disc disease), lumbar, Diverticulitis (1990s), GERD (gastroesophageal reflux disease), Hiatal hernia, Hyperlipidemia, Hypertension, Osteopenia, Rectal carcinoma (HCC), SBO (small bowel obstruction) (HCC) (04/25/2016), and Small bowel obstruction (HCC) (04/25/2016).   She has a past surgical history that includes intestinal  tear; Appendectomy; Cosmetic surgery (Left); Lumbar laminectomy/decompression microdiscectomy (Left, 06/14/2013); Knee arthroscopy (Right); Tumor removal (Left); Back surgery; Breast cyst excision (Left); Colostomy (1990s); Colostomy takedown (1990s); Colonoscopy w/ biopsies and polypectomy; and Colon surgery.   Her family history includes Cancer in her daughter, sister, and sister; Early death in her brother; Hypertension in her father and mother; Kidney disease in her father and mother.She reports that she quit smoking about 41 years ago. Her smoking use included cigarettes. She started smoking about 65 years ago. She has a 2.9 pack-year smoking history. She quit smokeless tobacco use about 77 years ago.  Her smokeless tobacco use included snuff. She reports that she does not currently use alcohol. She reports that she does not use drugs.    ROS Review of Systems  Constitutional: Negative.   HENT: Negative.    Eyes:  Negative for visual disturbance.  Respiratory:  Negative for shortness of breath.   Cardiovascular:  Negative for chest pain.  Gastrointestinal:  Negative for abdominal pain.  Musculoskeletal:  Positive for arthralgias (knees).    Objective:  BP (!) 170/71   Pulse 73   Temp 98.6 F (37 C)   Ht 5' 2 (1.575 m)   Wt 128 lb (58.1 kg)   SpO2 97%   BMI 23.41 kg/m   BP Readings from Last 3 Encounters:  11/26/23 (!) 170/71  09/14/23 (!) 158/83  06/24/23 (!) 142/75    Wt Readings from Last 3 Encounters:  11/26/23 128 lb (58.1 kg)   09/14/23 123 lb (55.8 kg)  06/24/23 128 lb (58.1 kg)     Physical Exam Constitutional:      General: She is not in acute distress.    Appearance: She is well-developed.  Cardiovascular:     Rate and Rhythm: Normal rate and regular rhythm.  Pulmonary:     Breath sounds: Normal breath sounds.  Musculoskeletal:        General: Normal range of motion.  Skin:    General: Skin is warm and dry.     Findings: Lesion (nodule right midfoot 1 cm raised) present.  Neurological:     Mental Status: She is alert and oriented to person, place, and time.      Assessment & Plan:  Arthritis of both feet -     Ambulatory referral to Physical Therapy  Essential hypertension, benign  Ganglion cyst of right foot  Other orders -     Nabumetone ; Take 1 tablet (500 mg total) by mouth 2 (two) times daily. For muscle and joint pain  Dispense: 180 tablet; Refill: 1    Assessment and Plan Assessment & Plan Primary osteoarthritis and tendinitis of the right foot and ankle   Chronic pain in the right foot and ankle is exacerbated by arthritis and tendinitis, significantly affecting mobility, especially in the morning. Previous x-rays confirm arthritis in the right foot. Examination reveals no significant swelling, but pain is present under the toes and in the calf area. Prescribe an alternative arthritis medication due to a previous adverse reaction to diclofenac . Refer to physical therapy for stretching and exercise guidance. Consider a pain management referral if pain persists. Discuss the potential for a corticosteroid injection in the back to alleviate leg pain.  Lumbar radiculopathy   Leg pain may be due to a pinched nerve in the spine, with a previous MRI showing arthritis and a protruding disc in the lower back. Consider a corticosteroid injection in the back to alleviate leg pain.  Polyneuropathy   This chronic condition contributes to leg pain and stiffness, with symptoms more pronounced in  the morning.  Essential hypertension   Blood pressure is slightly elevated, possibly due to pain, and is currently managed with amlodipine . Recheck blood pressure at the end of the visit and continue amlodipine  as prescribed.  Ganglion cyst of the right foot   A ganglion cyst on the right foot, likely from a fall, has been present for about three weeks without causing significant discomfort. It is expected to resolve over time. Monitor the cyst for changes and consider an orthopedic referral if it persists beyond six months or causes significant discomfort.  Recording duration: 23 minutes   Pt. And daughter insist BP is fine at home. I have asked that they monitor it regularly and report levels over 150/80   Follow-up: Return if symptoms worsen or fail to improve.  Butler Der, M.D.

## 2023-11-30 ENCOUNTER — Encounter: Payer: Self-pay | Admitting: Family Medicine

## 2023-12-16 ENCOUNTER — Encounter: Payer: Self-pay | Admitting: Family Medicine

## 2023-12-16 ENCOUNTER — Ambulatory Visit: Admitting: Family Medicine

## 2023-12-16 VITALS — BP 175/69 | HR 81 | Temp 98.2°F | Ht 62.0 in | Wt 129.0 lb

## 2023-12-16 DIAGNOSIS — I10 Essential (primary) hypertension: Secondary | ICD-10-CM

## 2023-12-16 DIAGNOSIS — G629 Polyneuropathy, unspecified: Secondary | ICD-10-CM | POA: Diagnosis not present

## 2023-12-16 DIAGNOSIS — Z23 Encounter for immunization: Secondary | ICD-10-CM

## 2023-12-16 DIAGNOSIS — M5416 Radiculopathy, lumbar region: Secondary | ICD-10-CM

## 2023-12-16 DIAGNOSIS — M5126 Other intervertebral disc displacement, lumbar region: Secondary | ICD-10-CM

## 2023-12-16 DIAGNOSIS — M170 Bilateral primary osteoarthritis of knee: Secondary | ICD-10-CM | POA: Diagnosis not present

## 2023-12-16 DIAGNOSIS — G2581 Restless legs syndrome: Secondary | ICD-10-CM

## 2023-12-16 MED ORDER — HYDROCODONE-ACETAMINOPHEN 5-325 MG PO TABS: 1.0000 | ORAL_TABLET | Freq: Three times a day (TID) | ORAL | 0 refills | Status: DC | PRN

## 2023-12-16 NOTE — Progress Notes (Signed)
 Subjective:  Patient ID: Anita Perez, female    DOB: [birth year]  Age: 88 y.o. MRN: [id number]  CC: Medical Management of Chronic Issues     HPI History of Present Illness Anita Perez is an 88 year old female with hypertension and lumbar disc herniation who presents for follow-up on pain control and knee pain.  She experiences persistent pain and stiffness in her feet and legs, describing them as 'hard as rocks', especially in the morning, which makes walking difficult. The stiffness is present almost constantly but improves somewhat after walking. She is concerned about neuropathy due to the pain in her feet.  Her right knee sometimes 'gives out' when walking, and she is interested in receiving a cortisone injection for pain relief. She uses a cane for mobility and can walk with assistance, driving for longer distances.  She is currently taking hydrocodone  5/325 mg three times a day for pain management related to lumbar disc herniation and radiculopathy. Her blood pressure was noted to be 175/69 during the visit, although she mentions it is lower at home. She has been monitoring it regularly and notes some fluctuations.  She was referred for physical therapy but has not been contacted by the therapy center and is unsure of the status of the referral.   Restless leg flares occasionally. Manageable though, Patient in for follow-up of GERD. Currently asymptomatic taking  PPI daily. There is no chest pain or heartburn. No hematemesis and no melena. No dysphagia or choking. Onset is remote. Progression is stable. Complicating factors, none.       11/26/2023    2:32 PM 06/24/2023    3:37 PM 06/11/2023    1:27 PM  Depression screen PHQ 2/9  Decreased Interest 1 1 2   Down, Depressed, Hopeless 0 0 0  PHQ - 2 Score 1 1 2   Altered sleeping 1 0 1  Tired, decreased energy 0 0 1  Change in appetite 0 0 1  Feeling bad or failure about yourself  0 0 1  Trouble concentrating 0 0 1   Moving slowly or fidgety/restless 0 0 1  Suicidal thoughts 0 0 1  PHQ-9 Score 2 1 9   Difficult doing work/chores Not difficult at all Not difficult at all Not difficult at all    [name] has a past medical history of Arthritis, Colon polyps, DDD (degenerative disc disease), lumbar, Diverticulitis (1990s), GERD (gastroesophageal reflux disease), Hiatal hernia, Hyperlipidemia, Hypertension, Osteopenia, Rectal carcinoma (HCC), SBO (small bowel obstruction) (HCC) (04/25/2016), and Small bowel obstruction (HCC) (04/25/2016).   She has a past surgical history that includes intestinal  tear; Appendectomy; Cosmetic surgery (Left); Lumbar laminectomy/decompression microdiscectomy (Left, 06/14/2013); Knee arthroscopy (Right); Tumor removal (Left); Back surgery; Breast cyst excision (Left); Colostomy (1990s); Colostomy takedown (1990s); Colonoscopy w/ biopsies and polypectomy; and Colon surgery.   Her family history includes Cancer in her daughter, sister, and sister; Early death in her brother; Hypertension in her father and mother; Kidney disease in her father and mother.She reports that she quit smoking about 41 years ago. Her smoking use included cigarettes. She started smoking about 65 years ago. She has a 2.9 pack-year smoking history. She quit smokeless tobacco use about 77 years ago.  Her smokeless tobacco use included snuff. She reports that she does not currently use alcohol. She reports that she does not use drugs.    ROS Review of Systems  Constitutional: Negative.   HENT:  Negative for congestion.   Eyes:  Negative for visual disturbance.  Respiratory:  Negative for shortness of breath.   Cardiovascular:  Negative for chest pain.  Gastrointestinal:  Negative for abdominal pain, constipation, diarrhea, nausea and vomiting.  Genitourinary:  Negative for difficulty urinating.  Musculoskeletal:  Negative for arthralgias and myalgias.  Neurological:  Negative for headaches.   Psychiatric/Behavioral:  Negative for sleep disturbance.     Objective:  BP (!) 175/69   Pulse 81   Temp 98.2 F (36.8 C)   Ht 5' 2 (1.575 m)   Wt 129 lb (58.5 kg)   SpO2 96%   BMI 23.59 kg/m   BP Readings from Last 3 Encounters:  12/16/23 (!) 175/69  11/26/23 (!) 170/71  09/14/23 (!) 158/83    Wt Readings from Last 3 Encounters:  12/16/23 129 lb (58.5 kg)  11/26/23 128 lb (58.1 kg)  09/14/23 123 lb (55.8 kg)     Physical Exam Constitutional:      General: She is not in acute distress.    Appearance: She is well-developed.  Cardiovascular:     Rate and Rhythm: Normal rate and regular rhythm.  Pulmonary:     Breath sounds: Normal breath sounds.  Musculoskeletal:        General: Normal range of motion.  Skin:    General: Skin is warm and dry.  Neurological:     Mental Status: She is alert and oriented to person, place, and time.      Assessment & Plan:  Lumbar disc herniation  Lumbar radiculopathy -     HYDROcodone -Acetaminophen ; Take 1 tablet by mouth 3 (three) times daily as needed for moderate pain (pain score 4-6).  Dispense: 90 tablet; Refill: 0 -     HYDROcodone -Acetaminophen ; Take 1 tablet by mouth 3 (three) times daily as needed for moderate pain (pain score 4-6).  Dispense: 90 tablet; Refill: 0 -     HYDROcodone -Acetaminophen ; Take 1 tablet by mouth 3 (three) times daily as needed for moderate pain (pain score 4-6).  Dispense: 90 tablet; Refill: 0  Encounter for immunization -     Flu vaccine HIGH DOSE PF(Fluzone Trivalent)  Essential hypertension, benign  Arthritis of both knees  Polyneuropathy   Assessment & Plan Primary osteoarthritis of bilateral knees   She experiences significant pain in both knees, especially the right, leading to instability and difficulty walking. She requested cortisone injections for pain relief, and the risks and benefits, including infection and bleeding, were discussed and understood.   Administer cortisone  injection in each knee using a mixture of 1.5 mL Celestone  and 2.5 mL Marcaine . Use ethyl chloride topically for pain control during injection.  Chronic pain due to lumbar radiculopathy and lumbar disc herniation   Chronic pain is managed with hydrocodone  5/325 mg three times a day, providing significant relief. PDMP review shows appropriate narcotic use with a below-average score for intentional overdose.  Polyneuropathy   She experiences persistent pain and tightness in her legs, particularly in the morning, with some improvement after walking. Neuropathy likely contributes to these symptoms. She understands neuropathy involves nerve damage causing pain and weakness. Follow up with referrals team regarding pending physical therapy referral.  Essential hypertension   Blood pressure recorded at 175/69 in the office, but lower readings are noted at home. Current management is deemed sufficient unless home readings increase.  General Health Maintenance   She is eligible for a flu vaccination. Clarification was provided regarding the safety of receiving a flu shot alongside knee injections. Administer the flu shot at the end of the visit.  Follow-up: Return in about 3 months (around 03/16/2024), or if symptoms worsen or fail to improve.  Butler Der, M.D.

## 2023-12-22 ENCOUNTER — Encounter: Payer: Self-pay | Admitting: Family Medicine

## 2024-01-13 DIAGNOSIS — G894 Chronic pain syndrome: Secondary | ICD-10-CM | POA: Diagnosis not present

## 2024-01-13 DIAGNOSIS — M5416 Radiculopathy, lumbar region: Secondary | ICD-10-CM | POA: Diagnosis not present

## 2024-01-14 ENCOUNTER — Telehealth: Payer: Self-pay

## 2024-01-14 NOTE — Telephone Encounter (Signed)
 Patient aware

## 2024-01-14 NOTE — Telephone Encounter (Signed)
 Copied from CRM [id number]. Topic: Clinical - Prescription Issue >> Jan 14, 2024  3:30 PM Rosaria BRAVO wrote: Pt called and wants to have her HYDROcodone -acetaminophen  (NORCO/VICODIN) 5-325 MG tablet (Starting on 01/21/2024) sent to pharmacy below instead of [facility]. Please advise  [facility] - [place], [place] - [address] [place] [address] Phone: [phone] Fax: [phone]

## 2024-01-14 NOTE — Telephone Encounter (Signed)
 We can do that next time she comes in for refills, but not between visits.

## 2024-01-21 DIAGNOSIS — M5416 Radiculopathy, lumbar region: Secondary | ICD-10-CM | POA: Diagnosis not present

## 2024-01-28 ENCOUNTER — Other Ambulatory Visit: Payer: Self-pay

## 2024-01-28 ENCOUNTER — Ambulatory Visit: Attending: Family Medicine | Admitting: Physical Therapy

## 2024-01-28 DIAGNOSIS — R2689 Other abnormalities of gait and mobility: Secondary | ICD-10-CM | POA: Diagnosis not present

## 2024-01-28 DIAGNOSIS — M6281 Muscle weakness (generalized): Secondary | ICD-10-CM | POA: Insufficient documentation

## 2024-01-28 DIAGNOSIS — M19072 Primary osteoarthritis, left ankle and foot: Secondary | ICD-10-CM | POA: Diagnosis not present

## 2024-01-28 DIAGNOSIS — M79671 Pain in right foot: Secondary | ICD-10-CM | POA: Diagnosis not present

## 2024-01-28 DIAGNOSIS — R2681 Unsteadiness on feet: Secondary | ICD-10-CM | POA: Diagnosis not present

## 2024-01-28 DIAGNOSIS — M19071 Primary osteoarthritis, right ankle and foot: Secondary | ICD-10-CM | POA: Insufficient documentation

## 2024-01-28 DIAGNOSIS — M79672 Pain in left foot: Secondary | ICD-10-CM | POA: Insufficient documentation
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
## 2024-02-04 ENCOUNTER — Encounter: Payer: Self-pay | Admitting: Physical Therapy

## 2024-02-04 ENCOUNTER — Ambulatory Visit: Attending: Family Medicine | Admitting: Physical Therapy

## 2024-02-04 DIAGNOSIS — R2681 Unsteadiness on feet: Secondary | ICD-10-CM | POA: Insufficient documentation

## 2024-02-04 DIAGNOSIS — R2689 Other abnormalities of gait and mobility: Secondary | ICD-10-CM | POA: Insufficient documentation

## 2024-02-04 DIAGNOSIS — M79671 Pain in right foot: Secondary | ICD-10-CM | POA: Insufficient documentation

## 2024-02-04 DIAGNOSIS — M79672 Pain in left foot: Secondary | ICD-10-CM | POA: Insufficient documentation

## 2024-02-04 DIAGNOSIS — M6281 Muscle weakness (generalized): Secondary | ICD-10-CM | POA: Insufficient documentation
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
## 2024-02-08 ENCOUNTER — Other Ambulatory Visit: Payer: Self-pay | Admitting: Family Medicine

## 2024-02-08 DIAGNOSIS — M5416 Radiculopathy, lumbar region: Secondary | ICD-10-CM

## 2024-02-08 DIAGNOSIS — G629 Polyneuropathy, unspecified: Secondary | ICD-10-CM

## 2024-02-08 NOTE — Telephone Encounter (Signed)
 Copied from CRM [id number]. Topic: Clinical - Medication Refill >> Feb 08, 2024  3:35 PM Lauren C wrote: Medication: HYDROcodone -acetaminophen  (NORCO/VICODIN) 5-325 MG tablet Refill set in future for 11/22, but she says she will probably be out by the end of this week.   gabapentin  (NEURONTIN ) 600 MG tablet  Has the patient contacted their pharmacy? No  This is the patient's preferred pharmacy:   [facility] - [place], [place] - [address] [place] [address] Phone: [phone] Fax: [phone]  Is this the correct pharmacy for this prescription? Yes If no, delete pharmacy and type the correct one.   Has the prescription been filled recently? Yes  Is the patient out of the medication? No  Has the patient been seen for an appointment in the last year OR does the patient have an upcoming appointment? Yes  Can we respond through MyChart? Please call [phone]  Agent: Please be advised that Rx refills may take up to 3 business days. We ask that you follow-up with your pharmacy.

## 2024-02-09 ENCOUNTER — Encounter: Payer: Self-pay | Admitting: *Deleted

## 2024-02-09 ENCOUNTER — Ambulatory Visit: Admitting: *Deleted

## 2024-02-09 DIAGNOSIS — R2689 Other abnormalities of gait and mobility: Secondary | ICD-10-CM | POA: Diagnosis not present

## 2024-02-09 DIAGNOSIS — M6281 Muscle weakness (generalized): Secondary | ICD-10-CM | POA: Diagnosis not present

## 2024-02-09 DIAGNOSIS — M79672 Pain in left foot: Secondary | ICD-10-CM | POA: Diagnosis not present

## 2024-02-09 DIAGNOSIS — M79671 Pain in right foot: Secondary | ICD-10-CM

## 2024-02-09 DIAGNOSIS — R2681 Unsteadiness on feet: Secondary | ICD-10-CM | POA: Diagnosis not present
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
## 2024-02-11 ENCOUNTER — Ambulatory Visit: Admitting: *Deleted

## 2024-02-11 DIAGNOSIS — R2689 Other abnormalities of gait and mobility: Secondary | ICD-10-CM | POA: Diagnosis not present

## 2024-02-11 DIAGNOSIS — R2681 Unsteadiness on feet: Secondary | ICD-10-CM | POA: Diagnosis not present

## 2024-02-11 DIAGNOSIS — M6281 Muscle weakness (generalized): Secondary | ICD-10-CM

## 2024-02-11 DIAGNOSIS — M79672 Pain in left foot: Secondary | ICD-10-CM | POA: Diagnosis not present

## 2024-02-11 DIAGNOSIS — M79671 Pain in right foot: Secondary | ICD-10-CM
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
## 2024-02-12 NOTE — Telephone Encounter (Signed)
 LMTCB to explain that the Gabapentin  was just filled by the pharmacy for a 3 mos supply on 02/04/24 and the other is for a controlled medication that will have to be filled on the date that it is written for.

## 2024-02-16 ENCOUNTER — Ambulatory Visit

## 2024-02-16 DIAGNOSIS — M79671 Pain in right foot: Secondary | ICD-10-CM | POA: Diagnosis not present

## 2024-02-16 DIAGNOSIS — M6281 Muscle weakness (generalized): Secondary | ICD-10-CM | POA: Diagnosis not present

## 2024-02-16 DIAGNOSIS — M79672 Pain in left foot: Secondary | ICD-10-CM

## 2024-02-16 DIAGNOSIS — R2689 Other abnormalities of gait and mobility: Secondary | ICD-10-CM | POA: Diagnosis not present

## 2024-02-16 DIAGNOSIS — R2681 Unsteadiness on feet: Secondary | ICD-10-CM | POA: Diagnosis not present
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
## 2024-02-18 ENCOUNTER — Ambulatory Visit

## 2024-02-18 DIAGNOSIS — M79672 Pain in left foot: Secondary | ICD-10-CM | POA: Diagnosis not present

## 2024-02-18 DIAGNOSIS — R2689 Other abnormalities of gait and mobility: Secondary | ICD-10-CM

## 2024-02-18 DIAGNOSIS — R2681 Unsteadiness on feet: Secondary | ICD-10-CM

## 2024-02-18 DIAGNOSIS — M79671 Pain in right foot: Secondary | ICD-10-CM | POA: Diagnosis not present

## 2024-02-18 DIAGNOSIS — M6281 Muscle weakness (generalized): Secondary | ICD-10-CM
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
## 2024-02-24 ENCOUNTER — Ambulatory Visit (INDEPENDENT_AMBULATORY_CARE_PROVIDER_SITE_OTHER): Admitting: Family Medicine

## 2024-02-24 VITALS — BP 173/72 | HR 64 | Temp 98.0°F | Ht 62.0 in | Wt 129.0 lb

## 2024-02-24 DIAGNOSIS — I10 Essential (primary) hypertension: Secondary | ICD-10-CM

## 2024-02-24 DIAGNOSIS — R42 Dizziness and giddiness: Secondary | ICD-10-CM | POA: Diagnosis not present

## 2024-02-24 DIAGNOSIS — G629 Polyneuropathy, unspecified: Secondary | ICD-10-CM | POA: Diagnosis not present

## 2024-02-24 DIAGNOSIS — M5416 Radiculopathy, lumbar region: Secondary | ICD-10-CM

## 2024-02-24 MED ORDER — AMLODIPINE BESYLATE 10 MG PO TABS: 10.0000 mg | ORAL_TABLET | Freq: Every day | ORAL | 0 refills | Status: AC

## 2024-02-24 MED ORDER — MECLIZINE HCL 12.5 MG PO TABS: 6.2500 mg | ORAL_TABLET | Freq: Three times a day (TID) | ORAL | 0 refills | Status: AC | PRN

## 2024-02-24 NOTE — Progress Notes (Signed)
 Subjective:  Patient ID: Anita Perez, female    DOB: [birth year]  Age: 88 y.o. MRN: [id number]  CC: Leg Pain (Bilateral leg pain. Tight in the calf/shin area. Ongoing for almost a year. Comes and goes. Medication helps when she does take it. Making it hard to walk and causing muscle weakness. Some numbness and tingling. )   HPI  Discussed the use of AI scribe software for clinical note transcription with the patient, who gave verbal consent to proceed.  History of Present Illness Anita Perez is an 88 year old female who presents with bilateral leg pain and stiffness.  She experiences intermittent pain and stiffness in both legs, with episodes occurring approximately every thirty minutes and lasting about three minutes. The severity of the pain prompted her to contact the clinic this morning.  She is currently out of her hydrocodone  prescription, which helps alleviate the pain. She mentions that she was informed she would not receive a refill until the 28th, but it should have been available on the 22nd. She has not yet contacted the pharmacy since the 22nd to verify availability. She also takes gabapentin , taking two pills in the morning and two in the evening. She has four bottles, each containing ninety pills, intended for a ninety-day supply.  She is concerned about taking too many medications but acknowledges that both hydrocodone  and gabapentin  help manage her symptoms. She reports sometimes taking up to four hydrocodone  pills a day when her pain is severe, and notes her prescription indicates two to four times a day.          02/24/2024    2:00 PM 11/26/2023    2:32 PM 06/24/2023    3:37 PM  Depression screen PHQ 2/9  Decreased Interest 1 1 1   Down, Depressed, Hopeless 0 0 0  PHQ - 2 Score 1 1 1   Altered sleeping 0 1 0  Tired, decreased energy 1 0 0  Change in appetite 0 0 0  Feeling bad or failure about yourself  0 0 0  Trouble concentrating 0 0 0  Moving slowly or  fidgety/restless 0 0 0  Suicidal thoughts 0 0 0  PHQ-9 Score 2 2  1    Difficult doing work/chores Somewhat difficult Not difficult at all Not difficult at all     Data saved with a previous flowsheet row definition    [name] has a past medical history of Arthritis, Colon polyps, DDD (degenerative disc disease), lumbar, Diverticulitis (1990s), GERD (gastroesophageal reflux disease), Hiatal hernia, Hyperlipidemia, Hypertension, Osteopenia, Rectal carcinoma (HCC), SBO (small bowel obstruction) (HCC) (04/25/2016), and Small bowel obstruction (HCC) (04/25/2016).   She has a past surgical history that includes intestinal  tear; Appendectomy; Cosmetic surgery (Left); Lumbar laminectomy/decompression microdiscectomy (Left, 06/14/2013); Knee arthroscopy (Right); Tumor removal (Left); Back surgery; Breast cyst excision (Left); Colostomy (1990s); Colostomy takedown (1990s); Colonoscopy w/ biopsies and polypectomy; and Colon surgery.   Her family history includes Cancer in her daughter, sister, and sister; Early death in her brother; Hypertension in her father and mother; Kidney disease in her father and mother.She reports that she quit smoking about 41 years ago. Her smoking use included cigarettes. She started smoking about 65 years ago. She has a 2.9 pack-year smoking history. She quit smokeless tobacco use about 77 years ago.  Her smokeless tobacco use included snuff. She reports that she does not currently use alcohol. She reports that she does not use drugs.    ROS Review of Systems  Constitutional:  Negative.   HENT:  Negative for congestion.   Eyes:  Negative for visual disturbance.  Respiratory:  Negative for shortness of breath.   Cardiovascular:  Negative for chest pain.  Gastrointestinal:  Negative for abdominal pain, constipation, diarrhea, nausea and vomiting.  Genitourinary:  Negative for difficulty urinating.  Musculoskeletal:  Negative for arthralgias and myalgias.   Neurological:  Positive for numbness (and tingling in legs). Negative for headaches.  Psychiatric/Behavioral:  Negative for sleep disturbance.     Objective:  BP (!) 173/72   Pulse 64   Temp 98 F (36.7 C)   Ht 5' 2 (1.575 m)   Wt 129 lb (58.5 kg)   SpO2 98%   BMI 23.59 kg/m   BP Readings from Last 3 Encounters:  02/24/24 (!) 173/72  12/16/23 (!) 175/69  11/26/23 (!) 170/71    Wt Readings from Last 3 Encounters:  02/24/24 129 lb (58.5 kg)  12/16/23 129 lb (58.5 kg)  11/26/23 128 lb (58.1 kg)     Physical Exam Physical Exam GENERAL: Alert, cooperative, well developed, no acute distress HEENT: Normocephalic, normal oropharynx, moist mucous membranes CHEST: Clear to auscultation bilaterally, No wheezes, rhonchi, or crackles CARDIOVASCULAR: Normal heart rate and rhythm, S1 and S2 normal without murmurs ABDOMEN: Soft, non-tender, non-distended, without organomegaly, Normal bowel sounds EXTREMITIES: No cyanosis or edema NEUROLOGICAL: Cranial nerves grossly intact, Moves all extremities without gross motor or sensory deficit   Assessment & Plan:  Essential hypertension, benign -     amLODIPine  Besylate; Take 1 tablet (10 mg total) by mouth daily.  Dispense: 90 tablet; Refill: 0  Vertigo -     Meclizine  HCl; Take 0.5 tablets (6.25 mg total) by mouth 3 (three) times daily as needed for dizziness.  Dispense: 90 tablet; Refill: 0  Polyneuropathy  Lumbar radiculopathy   Taking gabapentin  and hydrocodone  for these. PDMP shows she has not filled hydrocodone  for 2 months and has a prescription available still atht epharmacy fillable after 11/22 (4days ago.) Assessment and Plan Assessment & Plan Polyneuropathy and lumbar radiculopathy with chronic bilateral knee osteoarthritis pain   Chronic bilateral knee pain is linked to polyneuropathy and lumbar radiculopathy, with pain occurring intermittently every 30 minutes and lasting 3 minutes. Current management includes  hydrocodone  and gabapentin . Hydrocodone  provides relief but is habit-forming, while gabapentin  is effective when combined with hydrocodone . She is concerned about medication use but agrees to continue hydrocodone . There are prescription issues with hydrocodone  availability. Gabapentin  is increased to 2 tablets in the morning and 3 in the evening. Hydrocodone  prescription is ensured to be available at the pharmacy, and she is instructed to pick it up. She is advised not to exceed 3 hydrocodone  tablets per day.       Follow-up: Return in about 1 month (around 03/25/2024).  Butler Der, M.D.

## 2024-03-01 ENCOUNTER — Ambulatory Visit: Admitting: *Deleted

## 2024-03-03 ENCOUNTER — Ambulatory Visit

## 2024-03-08 ENCOUNTER — Ambulatory Visit: Attending: Family Medicine | Admitting: *Deleted

## 2024-03-08 DIAGNOSIS — R2681 Unsteadiness on feet: Secondary | ICD-10-CM | POA: Insufficient documentation

## 2024-03-08 DIAGNOSIS — M6281 Muscle weakness (generalized): Secondary | ICD-10-CM | POA: Diagnosis present

## 2024-03-08 DIAGNOSIS — R2689 Other abnormalities of gait and mobility: Secondary | ICD-10-CM | POA: Insufficient documentation

## 2024-03-08 DIAGNOSIS — M79671 Pain in right foot: Secondary | ICD-10-CM | POA: Insufficient documentation

## 2024-03-08 DIAGNOSIS — M79672 Pain in left foot: Secondary | ICD-10-CM | POA: Diagnosis present
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
## 2024-03-10 ENCOUNTER — Ambulatory Visit: Admitting: *Deleted

## 2024-03-10 ENCOUNTER — Encounter: Payer: Self-pay | Admitting: *Deleted

## 2024-03-10 DIAGNOSIS — M79672 Pain in left foot: Secondary | ICD-10-CM | POA: Diagnosis not present

## 2024-03-10 DIAGNOSIS — M79671 Pain in right foot: Secondary | ICD-10-CM

## 2024-03-10 DIAGNOSIS — M6281 Muscle weakness (generalized): Secondary | ICD-10-CM
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
## 2024-03-15 ENCOUNTER — Ambulatory Visit: Admitting: *Deleted

## 2024-03-15 DIAGNOSIS — M79672 Pain in left foot: Secondary | ICD-10-CM | POA: Diagnosis not present

## 2024-03-15 DIAGNOSIS — M6281 Muscle weakness (generalized): Secondary | ICD-10-CM

## 2024-03-15 DIAGNOSIS — M79671 Pain in right foot: Secondary | ICD-10-CM

## 2024-03-15 DIAGNOSIS — R2681 Unsteadiness on feet: Secondary | ICD-10-CM
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
## 2024-03-16 ENCOUNTER — Ambulatory Visit: Payer: Self-pay | Admitting: Family Medicine

## 2024-03-16 ENCOUNTER — Encounter: Payer: Self-pay | Admitting: Family Medicine

## 2024-03-16 VITALS — BP 175/70 | HR 76 | Temp 97.2°F | Ht 62.0 in | Wt 129.0 lb

## 2024-03-16 DIAGNOSIS — Z79899 Other long term (current) drug therapy: Secondary | ICD-10-CM

## 2024-03-16 DIAGNOSIS — G629 Polyneuropathy, unspecified: Secondary | ICD-10-CM

## 2024-03-16 DIAGNOSIS — M8580 Other specified disorders of bone density and structure, unspecified site: Secondary | ICD-10-CM | POA: Diagnosis not present

## 2024-03-16 DIAGNOSIS — M5416 Radiculopathy, lumbar region: Secondary | ICD-10-CM | POA: Diagnosis not present

## 2024-03-16 MED ORDER — HYDROCODONE-ACETAMINOPHEN 5-325 MG PO TABS: 1.0000 | ORAL_TABLET | Freq: Three times a day (TID) | ORAL | 0 refills | Status: DC | PRN
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
## 2024-03-17 ENCOUNTER — Ambulatory Visit: Admitting: *Deleted

## 2024-03-17 DIAGNOSIS — R2681 Unsteadiness on feet: Secondary | ICD-10-CM

## 2024-03-17 DIAGNOSIS — M79672 Pain in left foot: Secondary | ICD-10-CM

## 2024-03-17 DIAGNOSIS — M79671 Pain in right foot: Secondary | ICD-10-CM

## 2024-03-17 DIAGNOSIS — R2689 Other abnormalities of gait and mobility: Secondary | ICD-10-CM

## 2024-03-17 DIAGNOSIS — M6281 Muscle weakness (generalized): Secondary | ICD-10-CM

## 2024-03-17 LAB — CBC WITH DIFFERENTIAL/PLATELET
Basophils Absolute: 0.1 x10E3/uL (ref 0.0–0.2)
Basos: 1 %
EOS (ABSOLUTE): 0.1 x10E3/uL (ref 0.0–0.4)
Eos: 2 %
Hematocrit: 35.1 % (ref 34.0–46.6)
Hemoglobin: 10.9 g/dL — ABNORMAL LOW (ref 11.1–15.9)
Immature Grans (Abs): 0 x10E3/uL (ref 0.0–0.1)
Immature Granulocytes: 0 %
Lymphocytes Absolute: 1.6 x10E3/uL (ref 0.7–3.1)
Lymphs: 24 %
MCH: 26.1 pg — ABNORMAL LOW (ref 26.6–33.0)
MCHC: 31.1 g/dL — ABNORMAL LOW (ref 31.5–35.7)
MCV: 84 fL (ref 79–97)
Monocytes Absolute: 0.5 x10E3/uL (ref 0.1–0.9)
Monocytes: 8 %
Neutrophils Absolute: 4.4 x10E3/uL (ref 1.4–7.0)
Neutrophils: 65 %
Platelets: 296 x10E3/uL (ref 150–450)
RBC: 4.18 x10E6/uL (ref 3.77–5.28)
RDW: 14.5 % (ref 11.7–15.4)
WBC: 6.7 x10E3/uL (ref 3.4–10.8)

## 2024-03-17 LAB — CMP14+EGFR
ALT: 11 IU/L (ref 0–32)
AST: 18 IU/L (ref 0–40)
Albumin: 3.8 g/dL (ref 3.7–4.7)
Alkaline Phosphatase: 64 IU/L (ref 48–129)
BUN/Creatinine Ratio: 22 (ref 12–28)
BUN: 20 mg/dL (ref 8–27)
Bilirubin Total: 0.2 mg/dL (ref 0.0–1.2)
CO2: 24 mmol/L (ref 20–29)
Calcium: 9 mg/dL (ref 8.7–10.3)
Chloride: 106 mmol/L (ref 96–106)
Creatinine, Ser: 0.92 mg/dL (ref 0.57–1.00)
Globulin, Total: 2.3 g/dL (ref 1.5–4.5)
Glucose: 100 mg/dL — ABNORMAL HIGH (ref 70–99)
Potassium: 4.1 mmol/L (ref 3.5–5.2)
Sodium: 142 mmol/L (ref 134–144)
Total Protein: 6.1 g/dL (ref 6.0–8.5)
eGFR: 61 mL/min/1.73 (ref >59)

## 2024-03-17 LAB — VITAMIN B12: Vitamin B-12: 1417 pg/mL — ABNORMAL HIGH (ref 232–1245)
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
## 2024-03-21 ENCOUNTER — Ambulatory Visit: Admitting: *Deleted

## 2024-03-21 ENCOUNTER — Ambulatory Visit: Payer: Self-pay | Admitting: Family Medicine

## 2024-03-21 NOTE — Progress Notes (Signed)
Hello Sophiea,  Your lab result is normal and/or stable.Some minor variations that are not significant are commonly marked abnormal, but do not represent any medical problem for you.  Best regards, Lariza Cothron, M.D.

## 2024-03-22 ENCOUNTER — Ambulatory Visit

## 2024-03-22 DIAGNOSIS — R2681 Unsteadiness on feet: Secondary | ICD-10-CM

## 2024-03-22 DIAGNOSIS — M79672 Pain in left foot: Secondary | ICD-10-CM | POA: Diagnosis not present

## 2024-03-22 DIAGNOSIS — M79671 Pain in right foot: Secondary | ICD-10-CM

## 2024-03-22 DIAGNOSIS — M6281 Muscle weakness (generalized): Secondary | ICD-10-CM

## 2024-03-22 DIAGNOSIS — R2689 Other abnormalities of gait and mobility: Secondary | ICD-10-CM
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
## 2024-03-25 LAB — OXYCODONES,MS,WB/SP RFX
Oxycocone: NEGATIVE ng/mL
Oxycodones Confirmation: NEGATIVE
Oxymorphone: NEGATIVE ng/mL

## 2024-03-25 LAB — DRUG SCREEN 13 W/CONF , SERUM
Amphetamines, IA: NEGATIVE ng/mL
Barbiturates, IA: NEGATIVE ug/mL
Benzodiazepines, IA: NEGATIVE ng/mL
Cocaine & Metabolite, IA: NEGATIVE ng/mL
FENTANYL, IA: NEGATIVE ng/mL
MEPERIDINE, IA: NEGATIVE ng/mL
Methadone, IA: NEGATIVE ng/mL
Opiates, IA: POSITIVE ng/mL — AB
Oxycodones, IA: NEGATIVE ng/mL
Phencyclidine, IA: NEGATIVE ng/mL
Propoxyphene, IA: NEGATIVE ng/mL
THC(Marijuana) Metabolite, IA: NEGATIVE ng/mL
TRAMADOL, IA: NEGATIVE ng/mL

## 2024-03-25 LAB — OPIATES,MS,WB/SP RFX
6-Acetylmorphine: NEGATIVE
Codeine: NEGATIVE ng/mL
Dihydrocodeine: NEGATIVE ng/mL
Hydrocodone: 17.1 ng/mL
Hydromorphone: NEGATIVE ng/mL
Morphine: NEGATIVE ng/mL
Opiate Confirmation: POSITIVE

## 2024-03-29 ENCOUNTER — Ambulatory Visit

## 2024-04-01 ENCOUNTER — Encounter: Payer: Self-pay | Admitting: Emergency Medicine

## 2024-04-01 ENCOUNTER — Ambulatory Visit
Admission: EM | Admit: 2024-04-01 | Discharge: 2024-04-01 | Disposition: A | Discharge status: Home / Self Care | Attending: Family Medicine | Admitting: Family Medicine

## 2024-04-01 ENCOUNTER — Ambulatory Visit: Payer: Self-pay

## 2024-04-01 DIAGNOSIS — M79604 Pain in right leg: Secondary | ICD-10-CM | POA: Diagnosis not present

## 2024-04-01 DIAGNOSIS — M79605 Pain in left leg: Secondary | ICD-10-CM | POA: Diagnosis not present

## 2024-04-01 MED ORDER — CYCLOBENZAPRINE HCL 5 MG PO TABS: ORAL_TABLET | ORAL | 1 refills | Status: AC
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
## 2024-04-01 NOTE — ED Triage Notes (Signed)
 Patient c/o bilateral lower leg tightness that has gotten worse the last couple of days.  Patient states there is some swelling.  Denies any history of DVT's.  This was ruled out x 2 months ago.  Patient has taken Hydrocodone  and Gabapentin .

## 2024-04-01 NOTE — Telephone Encounter (Signed)
 NOTED

## 2024-04-01 NOTE — Discharge Instructions (Signed)
 Begin lower leg stretching exercises 5 times daily.

## 2024-04-01 NOTE — Telephone Encounter (Signed)
 Son will discuss ED recc with patient and call back if she refuses.  FYI Only or Action Required?: Action required by provider: update on patient condition.  Patient was last seen in primary care on 03/16/2024 by Zollie Lowers, MD.  Called Nurse Triage reporting Pain.  Symptoms began several days ago.  Interventions attempted: Prescription medications: hydrocodone -acetaminophen  three times daily and gabapentin  occassionally.  Symptoms are: rapidly worsening.  Triage Disposition: Go to ED Now (or PCP Triage) - will discuss with patient  Patient/caregiver understands and will follow disposition?: Will discuss with patient Reason for Disposition  Patient sounds very sick or weak to the triager  Answer Assessment - Initial Assessment Questions Leg pain more severe x 3-4 days. This morning, she told him that it is the entire left leg from hip down. Pt currently taking hydrocodone -acetaminophen  5-325 mg 3 times daily. States takes gabapentin  intermittently, son is not sure. States she says the gabapentin  does not help.   No OV available. ED recommended due severity of pain and it effecting the entire leg now.  1. ONSET: When did the pain start?      3-4 days, worse than her chronic pain 2. LOCATION: Where is the pain located?      Left leg 3. PAIN: How bad is the pain?    (Scale 1-10; or mild, moderate, severe)     10/10  Protocols used: Leg Pain-A-AH Copied from CRM #[id number]. Topic: Clinical - Medication Question >> Apr 01, 2024 10:45 AM Emylou G wrote: Reason for CRM: Son called.. said patient is interested in increasing this med to more due to her extreme pain.. HYDROcodone -acetaminophen  (NORCO/VICODIN) 5-325 MG tablet  Pls call son back to discuss.. pain is from left hip all the way down ( been going on about 4-5 days infecting the entire leg throughout the night) Past Medical History:  Diagnosis Date   Arthritis    might have it in my back (04/25/2016)   Colon polyps     DDD (degenerative disc disease), lumbar    Diverticulitis 1990s   don't have it anymore (04/25/2016)   GERD (gastroesophageal reflux disease)    Hiatal hernia    Hyperlipidemia    Hypertension    Osteopenia    Rectal carcinoma (HCC)    tumor; noncancerous (04/25/2016)   SBO (small bowel obstruction) (HCC) 04/25/2016   Small bowel obstruction (HCC) 04/25/2016

## 2024-04-04 ENCOUNTER — Other Ambulatory Visit: Payer: Self-pay

## 2024-04-04 ENCOUNTER — Ambulatory Visit: Payer: Self-pay

## 2024-04-04 ENCOUNTER — Emergency Department (HOSPITAL_BASED_OUTPATIENT_CLINIC_OR_DEPARTMENT_OTHER)
Admission: EM | Admit: 2024-04-04 | Discharge: 2024-04-04 | Disposition: A | Discharge status: Home / Self Care | Attending: Emergency Medicine | Admitting: Emergency Medicine

## 2024-04-04 ENCOUNTER — Encounter (HOSPITAL_BASED_OUTPATIENT_CLINIC_OR_DEPARTMENT_OTHER): Payer: Self-pay

## 2024-04-04 ENCOUNTER — Emergency Department (HOSPITAL_BASED_OUTPATIENT_CLINIC_OR_DEPARTMENT_OTHER): Admitting: Radiology

## 2024-04-04 DIAGNOSIS — M79604 Pain in right leg: Secondary | ICD-10-CM | POA: Diagnosis present

## 2024-04-04 DIAGNOSIS — M79605 Pain in left leg: Secondary | ICD-10-CM | POA: Insufficient documentation

## 2024-04-04 DIAGNOSIS — Z7982 Long term (current) use of aspirin: Secondary | ICD-10-CM | POA: Insufficient documentation

## 2024-04-04 DIAGNOSIS — G8929 Other chronic pain: Secondary | ICD-10-CM

## 2024-04-04 MED ORDER — HYDROCODONE-ACETAMINOPHEN 5-325 MG PO TABS
1.0000 | ORAL_TABLET | Freq: Once | ORAL | Status: AC
  Administered 2024-04-04: 1 via ORAL
  Filled 2024-04-04: qty 1

## 2024-04-04 MED ORDER — HYDROCODONE-ACETAMINOPHEN 5-325 MG PO TABS: 1.0000 | ORAL_TABLET | ORAL | 0 refills | Status: DC | PRN

## 2024-04-04 NOTE — Telephone Encounter (Signed)
 Pt is in ED now, will close encounter.

## 2024-04-04 NOTE — Telephone Encounter (Signed)
 Patient states her leg pain is worse, she states she can hardly walk. She is asking to be seen sooner or get pain pills. PAS states they spoke with [facility] and was told the patient can not receive pain pills without being seen. RN advised for worsening and severe leg pain to go to urgent care or ED to be seen today. Patient states she will consider and if she goes to the ED she will call back and cancel her appt for tomorrow with Ronal Quant, but would like to keep the appt for now.

## 2024-04-04 NOTE — ED Triage Notes (Signed)
 Arrives POV with complaints of ongoing bilateral leg pain for 2-3 weeks. Patient is reporting more pain in her left leg.  No recent fall or injuries.

## 2024-04-04 NOTE — Telephone Encounter (Signed)
 Patient son Camellia calling to see if pt can get pain medication prescribed. Writer reiterated previous message from nurse triage that the PCP would not prescribe anything until she is evaluated at her OV tomorrow 04/05/24. Eric verbalizes understanding and has no further questions.

## 2024-04-04 NOTE — ED Provider Triage Note (Signed)
 Emergency Medicine Provider Triage Evaluation Note  Anita Perez , a 89 y.o. female  was evaluated in triage.  Pt complains of bilateral leg pain, left left hurts worse from lateral hip to foot, onset months ago, worse since at least 03/30/24, went to the dr and given a muscle relaxor. No falls, no improvement with 2 days of the Flexeril . Pain is worse walking. Has been referred to PT but struggles to participate due to the pain she is in. Denies back pain.   Review of Systems  Positive:  Negative:   Physical Exam  There were no vitals taken for this visit. Gen:   Awake, no distress   Resp:  Normal effort  MSK:   Moves extremities without difficulty  Other:  DP pulses present. Right knee chronic swelling, no erythema. Generalized TTP bilateral lower extremities, no back TTP.  Medical Decision Making  Medically screening exam initiated at 4:19 PM.  Appropriate orders placed.  Anita Perez was informed that the remainder of the evaluation will be completed by another provider, this initial triage assessment does not replace that evaluation, and the importance of remaining in the ED until their evaluation is complete.     Beverley Leita LABOR, PA-C 04/04/24 1625

## 2024-04-04 NOTE — ED Provider Notes (Signed)
 " [place] EMERGENCY DEPARTMENT AT [facility] Provider Note   CSN: [id number] Arrival date & time: 04/04/24  1559     Patient presents with: Leg Pain (Bilateral )   Anita Perez is a 89 y.o. female.   89 y.o. female  was evaluated in triage.  Pt complains of bilateral leg pain, left left hurts worse from lateral hip to foot, onset months ago, worse since at least 03/30/24, went to the dr and given a muscle relaxor. No falls, no improvement with 2 days of the Flexeril . Pain is worse walking. Has been referred to PT but struggles to participate due to the pain she is in. Denies back pain.  Patient has a prescription for hydrocodone  however this is filled through mail order pharmacy and her most recent prescription is lost in the mail.  She is scheduled to see her provider tomorrow.  She feels like the hydrocodone  works well when she takes it.       Prior to Admission medications  Medication Sig Start Date End Date Taking? Authorizing Provider  HYDROcodone -acetaminophen  (NORCO/VICODIN) 5-325 MG tablet Take 1 tablet by mouth every 4 (four) hours as needed. 04/04/24  Yes Beverley Leita LABOR, PA-C  alendronate  (FOSAMAX ) 70 MG tablet Take 1 tablet (70 mg total) by mouth every 7 (seven) days. Take with a full glass of water on an empty stomach. 02/16/23   Zollie Lowers, MD  Alpha-Lipoic Acid 200 MG TABS Take by mouth.    [provider]  amLODipine  (NORVASC ) 10 MG tablet Take 1 tablet (10 mg total) by mouth daily. 02/24/24   Zollie Lowers, MD  aspirin  81 MG tablet Take 81 mg by mouth daily.    [provider]  atorvastatin  (LIPITOR) 80 MG tablet Take 1 tablet (80 mg total) by mouth daily. 11/24/22   Zollie Lowers, MD  Calcium  Carbonate-Vitamin D  (CALCIUM  + D PO) Take 600 mg by mouth daily.    [provider]  Cholecalciferol (VITAMIN D ) 2000 units CAPS Take 2,000 Units by mouth daily.    [provider]  cyclobenzaprine  (FLEXERIL ) 5 MG tablet Take one  tab PO HS PRN muscle pain 04/01/24   Pauline Garnette LABOR, MD  gabapentin  (NEURONTIN ) 600 MG tablet Take 2 tablets (1,200 mg total) by mouth 2 (two) times daily. 09/14/23   Zollie Lowers, MD  HYDROcodone -acetaminophen  (NORCO/VICODIN) 5-325 MG tablet Take 1 tablet by mouth 3 (three) times daily as needed for moderate pain (pain score 4-6). 03/25/24   Zollie Lowers, MD  HYDROcodone -acetaminophen  (NORCO/VICODIN) 5-325 MG tablet Take 1 tablet by mouth 3 (three) times daily as needed for moderate pain (pain score 4-6). 04/23/24   Zollie Lowers, MD  HYDROcodone -acetaminophen  (NORCO/VICODIN) 5-325 MG tablet Take 1 tablet by mouth 3 (three) times daily as needed for moderate pain (pain score 4-6). 05/24/24   Zollie Lowers, MD  meclizine  (ANTIVERT ) 12.5 MG tablet Take 0.5 tablets (6.25 mg total) by mouth 3 (three) times daily as needed for dizziness. 02/24/24   Zollie Lowers, MD  megestrol  (MEGACE ) 400 MG/10ML suspension Take 10 mLs (400 mg total) by mouth 2 (two) times daily. For appetite stimulation 02/16/23   Zollie Lowers, MD  Multiple Vitamin (MULTIVITAMIN) tablet Take 1 tablet by mouth daily.    [provider]  nabumetone  (RELAFEN ) 500 MG tablet Take 1 tablet (500 mg total) by mouth 2 (two) times daily. For muscle and joint pain 11/26/23   Zollie Lowers, MD  neomycin -polymyxin-hydrocortisone (CORTISPORIN) OTIC solution Place 4 drops into both  ears 4 (four) times daily. 06/24/23   Zollie Lowers, MD  pantoprazole  (PROTONIX ) 40 MG tablet Take 1 tablet (40 mg total) by mouth daily. For stomach 06/11/23   Zollie Lowers, MD  polyethylene glycol powder (GLYCOLAX /MIRALAX ) 17 GM/SCOOP powder DISSOLVE 17 GRAMS IN 8 OZ OF FLUID LIQUID DRINK DAILY AS DIRECTED 08/13/20   Zollie Lowers, MD  raloxifene  (EVISTA ) 60 MG tablet TAKE 1 TABLET ONCE DAILY (FOR BONE HEALTH AND BREAST CANCER PREVENTION) 02/16/23   Zollie Lowers, MD    Allergies: Diclofenac , Lyrica  [pregabalin ], Pepto-bismol [bismuth], and Ropinirole      Review of Systems Negative except as per HPI Updated Vital Signs BP (!) 206/95 (BP Location: Left Arm)   Pulse 74   Temp 98.7 F (37.1 C)   Resp 18   Ht 5' 3.5 (1.613 m)   Wt 59 kg   SpO2 100%   BMI 22.68 kg/m   Physical Exam Vitals and nursing note reviewed.  Constitutional:      General: She is not in acute distress.    Appearance: She is well-developed. She is not diaphoretic.  HENT:     Head: Normocephalic and atraumatic.  Cardiovascular:     Pulses: Normal pulses.  Pulmonary:     Effort: Pulmonary effort is normal.  Musculoskeletal:        General: Swelling and tenderness present. No deformity or signs of injury.     Right lower leg: No edema.     Left lower leg: No edema.     Comments: Arthritic changes of the right knee without effusion or erythema. Lower extremities with generalized tenderness without crepitus.   Skin:    General: Skin is warm and dry.     Findings: No erythema or rash.  Neurological:     Mental Status: She is alert and oriented to person, place, and time.     Sensory: No sensory deficit.     Motor: No weakness.  Psychiatric:        Behavior: Behavior normal.     (all labs ordered are listed, but only abnormal results are displayed) Labs Reviewed - No data to display  EKG: None  Radiology: DG Lumbar Spine Complete Result Date: 04/04/2024 CLINICAL DATA:  Leg pain EXAM: LUMBAR SPINE - COMPLETE 4+ VIEW COMPARISON:  MRI 04/27/2022, radiograph 10/08/2017 FINDINGS: Slight increase dextroscoliosis. Vertebral body heights are maintained. Advanced disc space narrowing L2-L3, L3-L4 and L4 L5. Prominent facet degenerative changes of the lower lumbar spine. Aortic atherosclerosis. IMPRESSION: Slight increase in dextroscoliosis with advanced multilevel degenerative changes, progressive compared with radiographs from 2019. Electronically Signed   By: Luke Bun M.D.   On: 04/04/2024 18:54   DG Knee Complete 4 Views Left Result Date:  04/04/2024 CLINICAL DATA:  Leg pain EXAM: LEFT KNEE - COMPLETE 4+ VIEW COMPARISON:  None Available. FINDINGS: No fracture or malalignment. No sizable knee effusion. Mild medial joint space narrowing. IMPRESSION: Mild degenerative change Electronically Signed   By: Luke Bun M.D.   On: 04/04/2024 18:51   DG Hip Unilat With Pelvis 2-3 Views Left Result Date: 04/04/2024 CLINICAL DATA:  Bilateral leg pain EXAM: DG HIP (WITH OR WITHOUT PELVIS) 2-3V LEFT COMPARISON:  None recent FINDINGS: SI joints are non widened. Pubic symphysis and rami appear intact. No fracture or malalignment. Mild hip degenerative change IMPRESSION: Mild degenerative change. Electronically Signed   By: Luke Bun M.D.   On: 04/04/2024 18:50     Procedures   Medications Ordered in the ED  HYDROcodone -acetaminophen  (NORCO/VICODIN)  5-325 MG per tablet 1 tablet (1 tablet Oral Given 04/04/24 2033)                                    Medical Decision Making Amount and/or Complexity of Data Reviewed Radiology: ordered.  Risk Prescription drug management.   89 year old female with complaint of lower extremity pain.  Patient has been managed by her PCP successfully with hydrocodone  however her prescription has recently been lost in the mail.  She is scheduled to see a provider in her PCP office tomorrow.  X-rays obtained today with chronic changes, no acute injuries or findings.  Patient and family agreeable with plan for dose of her hydrocodone  here.  Will provide prescription for same but advised to hold on filling this until they find out if she is in a pain management contract or if PCP is able to fill her medication while awaiting for her mail order delivery.     Final diagnoses:  Chronic pain of both lower extremities    ED Discharge Orders          Ordered    HYDROcodone -acetaminophen  (NORCO/VICODIN) 5-325 MG tablet  Every 4 hours PRN        04/04/24 2031               Beverley Leita LABOR, PA-C 04/04/24  2154    Doretha Folks, MD 04/05/24 1527  "

## 2024-04-04 NOTE — Telephone Encounter (Signed)
 FYI Only or Action Required?: FYI only for provider: appointment scheduled on 1/6.  Patient was last seen in primary care on 03/16/2024 by Zollie Lowers, MD.  Called Nurse Triage reporting Leg Pain and Hip Pain.  Symptoms began several days ago.  Interventions attempted: Prescription medications: hydrocodone , UC gave muscle relaxer but little relief, Rest, hydration, or home remedies, and Other: massage.  Symptoms are: rapidly worsening.  Triage Disposition: See HCP Within 4 Hours (Or PCP Triage)  Patient/caregiver understands and will follow disposition?: Yes    Copied from CRM #[id number]. Topic: Clinical - Red Word Triage >> Apr 04, 2024 12:42 PM Anita Perez SAILOR wrote: Red Word that prompted transfer to Nurse Triage: Son Anita Perez calling not with PT but has brother on the other line is with PT says PT has Severe Pain in RT leg/PT has neuropathy/Pain level 9/10. Going up to hip/Went to Urgent care on 01/02 Reason for Disposition  [1] SEVERE pain (e.g., excruciating, unable to do any normal activities) AND [2] not improved after 2 hours of pain medicine  Answer Assessment - Initial Assessment Questions This RN recommended that pt be examined in next 4 hours for symptoms, scheduled first available with PCP office per son preference. Advised call back or go to ED if any new or worsening symptoms.    Speaking with pt son, Anita Perez 9/10 pain Sister took her to UC on Friday, no blood clots or discoloration, just pain excruciating UC didn't say much, gave her a muscle relaxer which isn't doing too much for her For about a week No recent injury Entire right leg, used to be just lower part of her leg, now entire leg since Thursday or so Always had pain in lower leg just recently gone to upper part of her leg No chest pain or SOB Feet are about the same as far as color/temp Not able to get out of bed because sits there and massage it, takes med, and puts cream Eventually once she works with it, she's  able to bear weight on it No redness/streaking No fever or joint swelling No swelling Just pain keeping her from walking rather than weakness Running out of hydrocodone  May be both legs but one more extreme than the other  Protocols used: Leg Pain-A-AH

## 2024-04-04 NOTE — Discharge Instructions (Signed)
 Follow up with your doctor's office tomorrow as scheduled. Take Norco as prescribed.

## 2024-04-05 ENCOUNTER — Ambulatory Visit: Admitting: Nurse Practitioner

## 2024-04-05 ENCOUNTER — Telehealth: Payer: Self-pay | Admitting: Family Medicine

## 2024-04-05 ENCOUNTER — Ambulatory Visit: Attending: Family Medicine | Admitting: *Deleted

## 2024-04-05 ENCOUNTER — Encounter (HOSPITAL_COMMUNITY): Payer: Self-pay | Admitting: *Deleted

## 2024-04-05 ENCOUNTER — Emergency Department (HOSPITAL_COMMUNITY)
Admission: EM | Admit: 2024-04-05 | Discharge: 2024-04-05 | Disposition: A | Discharge status: Home / Self Care | Attending: Emergency Medicine | Admitting: Emergency Medicine

## 2024-04-05 ENCOUNTER — Other Ambulatory Visit: Payer: Self-pay

## 2024-04-05 DIAGNOSIS — G8929 Other chronic pain: Secondary | ICD-10-CM

## 2024-04-05 DIAGNOSIS — M79605 Pain in left leg: Secondary | ICD-10-CM | POA: Insufficient documentation

## 2024-04-05 DIAGNOSIS — Z7982 Long term (current) use of aspirin: Secondary | ICD-10-CM | POA: Insufficient documentation

## 2024-04-05 DIAGNOSIS — M79604 Pain in right leg: Secondary | ICD-10-CM | POA: Diagnosis not present

## 2024-04-05 MED ORDER — MORPHINE SULFATE (PF) 4 MG/ML IV SOLN
4.0000 mg | Freq: Once | INTRAVENOUS | Status: AC
  Administered 2024-04-05: 4 mg via INTRAMUSCULAR
  Filled 2024-04-05: qty 1

## 2024-04-05 NOTE — ED Provider Notes (Signed)
 " [place] EMERGENCY DEPARTMENT AT [facility] Provider Note   CSN: [id number] Arrival date & time: 04/05/24  1152     Patient presents with: Back Pain   Anita Perez is a 89 y.o. female.  She has chronic pain in her legs, left leg is greater than the right.  She follows with her PCP who prescribes hydrocodone  for this.  She states over the past few weeks her pain has gotten worse.  Her normal hydrocodone  is a mail-order medication has not arrived, she is called and checked in the state is and route but has not yet arrived.  She denies any recent injury or trauma, denies back pain, no saddle anesthesia or paresthesia, no bowel or bladder incontinence.  She was at [facility] ED last night and had x-rays that were reassuring, was given prescription of hydrocodone  to help with pain until she can get her prescription through mail order.  She had not pick this up yet, though her son was able to pick this up while she was in the ER today.  Denies fever chills or weight loss.  Denies numbness tingling or or other symptoms.  But with her PCP today but states she was in too much pain and had EMS bring her here.    Back Pain      Prior to Admission medications  Medication Sig Start Date End Date Taking? Authorizing Provider  alendronate  (FOSAMAX ) 70 MG tablet Take 1 tablet (70 mg total) by mouth every 7 (seven) days. Take with a full glass of water on an empty stomach. 02/16/23   Zollie Lowers, MD  Alpha-Lipoic Acid 200 MG TABS Take by mouth.    [provider]  amLODipine  (NORVASC ) 10 MG tablet Take 1 tablet (10 mg total) by mouth daily. 02/24/24   Zollie Lowers, MD  aspirin  81 MG tablet Take 81 mg by mouth daily.    [provider]  atorvastatin  (LIPITOR) 80 MG tablet Take 1 tablet (80 mg total) by mouth daily. 11/24/22   Zollie Lowers, MD  Calcium  Carbonate-Vitamin D  (CALCIUM  + D PO) Take 600 mg by mouth daily.    [provider]  Cholecalciferol (VITAMIN  D) 2000 units CAPS Take 2,000 Units by mouth daily.    [provider]  cyclobenzaprine  (FLEXERIL ) 5 MG tablet Take one tab PO HS PRN muscle pain 04/01/24   Pauline Garnette LABOR, MD  gabapentin  (NEURONTIN ) 600 MG tablet Take 2 tablets (1,200 mg total) by mouth 2 (two) times daily. 09/14/23   Zollie Lowers, MD  HYDROcodone -acetaminophen  (NORCO/VICODIN) 5-325 MG tablet Take 1 tablet by mouth 3 (three) times daily as needed for moderate pain (pain score 4-6). 03/25/24   Zollie Lowers, MD  HYDROcodone -acetaminophen  (NORCO/VICODIN) 5-325 MG tablet Take 1 tablet by mouth 3 (three) times daily as needed for moderate pain (pain score 4-6). 04/23/24   Zollie Lowers, MD  HYDROcodone -acetaminophen  (NORCO/VICODIN) 5-325 MG tablet Take 1 tablet by mouth 3 (three) times daily as needed for moderate pain (pain score 4-6). 05/24/24   Zollie Lowers, MD  HYDROcodone -acetaminophen  (NORCO/VICODIN) 5-325 MG tablet Take 1 tablet by mouth every 4 (four) hours as needed. 04/04/24   Beverley Leita LABOR, PA-C  meclizine  (ANTIVERT ) 12.5 MG tablet Take 0.5 tablets (6.25 mg total) by mouth 3 (three) times daily as needed for dizziness. 02/24/24   Zollie Lowers, MD  megestrol  (MEGACE ) 400 MG/10ML suspension Take 10 mLs (400 mg total) by mouth 2 (two) times daily. For appetite stimulation 02/16/23  Zollie Lowers, MD  Multiple Vitamin (MULTIVITAMIN) tablet Take 1 tablet by mouth daily.    [provider]  nabumetone  (RELAFEN ) 500 MG tablet Take 1 tablet (500 mg total) by mouth 2 (two) times daily. For muscle and joint pain 11/26/23   Zollie Lowers, MD  neomycin -polymyxin-hydrocortisone (CORTISPORIN) OTIC solution Place 4 drops into both ears 4 (four) times daily. 06/24/23   Zollie Lowers, MD  pantoprazole  (PROTONIX ) 40 MG tablet Take 1 tablet (40 mg total) by mouth daily. For stomach 06/11/23   Zollie Lowers, MD  polyethylene glycol powder (GLYCOLAX /MIRALAX ) 17 GM/SCOOP powder DISSOLVE 17 GRAMS IN 8 OZ OF FLUID LIQUID DRINK  DAILY AS DIRECTED 08/13/20   Zollie Lowers, MD  raloxifene  (EVISTA ) 60 MG tablet TAKE 1 TABLET ONCE DAILY (FOR BONE HEALTH AND BREAST CANCER PREVENTION) 02/16/23   Zollie Lowers, MD    Allergies: Diclofenac , Lyrica  [pregabalin ], Pepto-bismol [bismuth], and Ropinirole     Review of Systems  Musculoskeletal:  Positive for back pain.    Updated Vital Signs BP (!) 176/78 (BP Location: Right Arm)   Pulse 82   Temp 98.6 F (37 C) (Oral)   Resp 18   Ht 5' 3.5 (1.613 m)   Wt 59 kg   SpO2 97%   BMI 22.67 kg/m   Physical Exam Vitals and nursing note reviewed.  Constitutional:      General: She is not in acute distress.    Appearance: She is well-developed.  HENT:     Head: Normocephalic and atraumatic.     Mouth/Throat:     Mouth: Mucous membranes are moist.  Eyes:     Conjunctiva/sclera: Conjunctivae normal.  Cardiovascular:     Rate and Rhythm: Normal rate and regular rhythm.     Heart sounds: No murmur heard. Pulmonary:     Effort: Pulmonary effort is normal. No respiratory distress.     Breath sounds: Normal breath sounds.  Abdominal:     Palpations: Abdomen is soft.     Tenderness: There is no abdominal tenderness.  Musculoskeletal:        General: No swelling.     Cervical back: Neck supple.     Comments: DP and PT pulses intact in bilateral feet.  Normal range of motion bilateral hips knees and ankles.  Skin:    General: Skin is warm and dry.     Capillary Refill: Capillary refill takes less than 2 seconds.  Neurological:     General: No focal deficit present.     Mental Status: She is alert and oriented to person, place, and time.     Sensory: No sensory deficit.     Motor: No weakness.     Comments: Negative straight leg raise bilaterally  Psychiatric:        Mood and Affect: Mood normal.     (all labs ordered are listed, but only abnormal results are displayed) Labs Reviewed - No data to display  EKG: None  Radiology: DG Lumbar Spine  Complete Result Date: 04/04/2024 CLINICAL DATA:  Leg pain EXAM: LUMBAR SPINE - COMPLETE 4+ VIEW COMPARISON:  MRI 04/27/2022, radiograph 10/08/2017 FINDINGS: Slight increase dextroscoliosis. Vertebral body heights are maintained. Advanced disc space narrowing L2-L3, L3-L4 and L4 L5. Prominent facet degenerative changes of the lower lumbar spine. Aortic atherosclerosis. IMPRESSION: Slight increase in dextroscoliosis with advanced multilevel degenerative changes, progressive compared with radiographs from 2019. Electronically Signed   By: Luke Bun M.D.   On: 04/04/2024 18:54   DG Knee Complete 4 Views Left  Result Date: 04/04/2024 CLINICAL DATA:  Leg pain EXAM: LEFT KNEE - COMPLETE 4+ VIEW COMPARISON:  None Available. FINDINGS: No fracture or malalignment. No sizable knee effusion. Mild medial joint space narrowing. IMPRESSION: Mild degenerative change Electronically Signed   By: Luke Bun M.D.   On: 04/04/2024 18:51   DG Hip Unilat With Pelvis 2-3 Views Left Result Date: 04/04/2024 CLINICAL DATA:  Bilateral leg pain EXAM: DG HIP (WITH OR WITHOUT PELVIS) 2-3V LEFT COMPARISON:  None recent FINDINGS: SI joints are non widened. Pubic symphysis and rami appear intact. No fracture or malalignment. Mild hip degenerative change IMPRESSION: Mild degenerative change. Electronically Signed   By: Luke Bun M.D.   On: 04/04/2024 18:50     Procedures   Medications Ordered in the ED  morphine  (PF) 4 MG/ML injection 4 mg (4 mg Intramuscular Given 04/05/24 1544)                                    Medical Decision Making Parental diagnose includes but limited to neuropathy, neuralgia, HNP, other  Course: Patient presents the ER today complaining of bilateral leg pain left greater than right, normally the pain is only in her lower legs, described as a burning and tingling pain.  She states over the past couple weeks it has progressed to where it shoots up her left leg as well.  She has no back pain, no  signs or symptoms of cauda equina.  No infectious symptoms.  No weight loss.  X-rays at [facility] yesterday were reassuring.  Patient was given IM morphine  here with significant improvement of her pain.  Did not have any sign of ischemia of her limbs.  Her son was able to pick up her hydrocodone  here so she does not need further prescriptions.  She is agreeable with discharge home and close outpatient follow-up.  They did request referral to pain management so this information was provided.  She was given strict return precautions.  Risk Prescription drug management.        Final diagnoses:  None    ED Discharge Orders     None          Suellen Sherran DELENA DEVONNA 04/05/24 1928  "

## 2024-04-05 NOTE — Telephone Encounter (Signed)
 Wants all medications switched backed to [facility] in [place].

## 2024-04-05 NOTE — Discharge Instructions (Signed)
 Seen today for chronic pain, fortunately were able to pick up your prescription for pain medicine.  You are given morphine  here to help with your symptoms.  Follow-up with your PCP and you can also schedule appointment with pain management as listed above.  Come back to the ER for new or worsening symptoms.

## 2024-04-05 NOTE — Telephone Encounter (Signed)
 Message was sent to teams to triage that patient was out front and needed an ambulance. Went out front to speak with patient and son to see what was going on. They stated that she was in sever pain and could not move or walk she has had no injury and that she has been to the hospital all day yesterday. They stated she did not want to be seen here just wanted us  to call EMS to come here and get her. Upon talking with patient she was in no distress. They just wanted us  to call EMS. EMS was called and they are coming to get her.

## 2024-04-05 NOTE — Telephone Encounter (Signed)
 Correct pharmacy updated. No refills needed at [facility] time.

## 2024-04-05 NOTE — ED Triage Notes (Signed)
 Pt BIB [facility] for back pain, reported flare up since Christmas. Seen at [facility] for same and reported pt not able to get pain meds filled.  Hx  of back pain

## 2024-04-06 ENCOUNTER — Telehealth: Payer: Self-pay

## 2024-04-06 NOTE — Telephone Encounter (Signed)
 Copied from CRM [id number]. Topic: Clinical - Medication Question >> Apr 06, 2024 12:19 PM Anita Perez wrote: Reason for CRM: Patient son calling to ask if his mom can increase her medication: HYDROcodone -acetaminophen  (NORCO/VICODIN) 5-325 MG tablet To TWO tabs?  Please advise

## 2024-04-06 NOTE — Telephone Encounter (Signed)
 I cannot attest to this because this would cause them to run out early.  They would need to be seen for an appointment for any possible adjustments.  If cannot wait until PCP returns then they can be seen by another provider in our office who can then discuss it with them and try and make an understanding a judgment call but that will be difficult because nobody else will know them like their PCP. Fonda Levins, MD [facility] 04/06/2024, 3:35 PM

## 2024-04-06 NOTE — Telephone Encounter (Signed)
 Spoke with patients son and he confirmed that patient is already taking the Hydrocodone  Rx 3x daily (1 tab every 4 hrs or so) but wants to know if they can give patient 2 tabs every 4 hrs or PRN, especially in the morning is when patient complains the most about pain. Can lead provider advise on this?

## 2024-04-06 NOTE — Telephone Encounter (Signed)
 Son made aware of recommendations. They will not make any changes at this time and will call back for a sooner appt if needed.

## 2024-04-07 ENCOUNTER — Ambulatory Visit: Payer: Self-pay

## 2024-04-07 NOTE — Telephone Encounter (Signed)
 Called and spoke with patients son Elvin) who confirmed that patient received her Hydrocodone  Rx in the mail today, about an hour ago, so she is okay now.

## 2024-04-07 NOTE — Telephone Encounter (Signed)
 FYI Only or Action Required?: Action required by provider: medication refill request.  Patient was last seen in primary care on 03/16/2024 by Zollie Lowers, MD.  Called Nurse Triage reporting Medication Problem.  Symptoms began several days ago.  Interventions attempted: Rest, hydration, or home remedies.  Symptoms are: gradually worsening.  Triage Disposition: No disposition on file.  Patient/caregiver understands and will follow disposition?:   Copied from CRM [id number]. Topic: Clinical - Red Word Triage >> Apr 07, 2024  2:28 PM [name] wrote: Red Word that prompted transfer to Nurse Triage: patient in excruciating pain in both legs  Anita Perez (son)calling on behalf of pt  Patient been to emergency room monday,tuesday ,and went to urgent care on sunday  have some nerve damage in her both of  legs ,  the medication HYDROcodone -acetaminophen  (NORCO/VICODIN) 5-325 MG tablet  suppose to be coming in the mail since decemeber 26,2025 ,  the pills suppose to be deliver through [employer] still have not recieved the medication    pt recieved 10 pills from [facility] [place] on Tuesday while waiting the medication to come in the mail   ,patient has 1 pill or 2 pills left Reason for Disposition  [1] Caller has NON-URGENT medicine question about med that PCP prescribed AND [2] triager unable to answer question  Answer Assessment - Initial Assessment Questions Has been to several facilities for pain management since her hydro. Has not arrived in the mail. Pharmacy released package to [employer]. Complaints has been filed with both companies.  Now only has 2 tabs left.    1. NAME of MEDICINE: What medicine(s) are you calling about?     hydrocodone  2. QUESTION: What is your question? (e.g., double dose of medicine, side effect)     refill  Protocols used: Medication Question Call-A-AH

## 2024-04-08 ENCOUNTER — Ambulatory Visit: Admitting: Nurse Practitioner

## 2024-04-11 ENCOUNTER — Ambulatory Visit (INDEPENDENT_AMBULATORY_CARE_PROVIDER_SITE_OTHER): Admitting: Family Medicine

## 2024-04-11 ENCOUNTER — Encounter: Payer: Self-pay | Admitting: Family Medicine

## 2024-04-11 ENCOUNTER — Ambulatory Visit: Payer: Self-pay

## 2024-04-11 VITALS — BP 141/70 | HR 80 | Temp 98.6°F | Ht 63.5 in | Wt 131.2 lb

## 2024-04-11 DIAGNOSIS — M5416 Radiculopathy, lumbar region: Secondary | ICD-10-CM

## 2024-04-11 DIAGNOSIS — G629 Polyneuropathy, unspecified: Secondary | ICD-10-CM

## 2024-04-11 DIAGNOSIS — M150 Primary generalized (osteo)arthritis: Secondary | ICD-10-CM | POA: Diagnosis not present

## 2024-04-11 NOTE — Telephone Encounter (Signed)
 Appt made

## 2024-04-11 NOTE — Telephone Encounter (Signed)
 FYI Only or Action Required?: FYI only for provider: appointment scheduled on today.  Patient was last seen in primary care on 03/16/2024 by Anita Lowers, MD.  Called Nurse Triage reporting Leg Pain.  Symptoms began several weeks ago.  Interventions attempted: Prescription medications: Norco and Rest, hydration, or home remedies.  Symptoms are: gradually worsening.  Triage Disposition: See HCP Within 4 Hours (Or PCP Triage)  Patient/caregiver understands and will follow disposition?: Yes Reason for Disposition  [1] SEVERE pain (e.g., excruciating, unable to do any normal activities) AND [2] not improved after 2 hours of pain medicine  Answer Assessment - Initial Assessment Questions ER Anita Perez 04/05/24-Morphine  injection [facility] 04/04/24-imaging.   No recent injuries. Scans at hospital show arthritis.  Norco not strong enough  1. ONSET: When did the pain start?      2-3 weeks  2. LOCATION: Where is the pain located?      Bilateral legs  3. PAIN: How bad is the pain?    (Scale 1-10; or mild, moderate, severe)     Severe-worse in the morning 4. WORK OR EXERCISE: Has there been any recent work or exercise that involved this part of the body?      no 5. CAUSE: What do you think is causing the leg pain?     unsure 6. OTHER SYMPTOMS: Do you have any other symptoms? (e.g., chest pain, back pain, breathing difficulty, swelling, rash, fever, numbness, weakness)      Denies  Protocols used: Leg Pain-A-AH Copied from CRM #[id number]. Topic: Clinical - Red Word Triage >> Apr 11, 2024 10:23 AM Anita Perez wrote: Red Word that prompted transfer to Nurse Triage:  extreme pain in both legs thru out the day  mainly in morning time , stated the medications has little or no effect pt is on the gabapentin  (NEURONTIN ) 600 MG tablet HYDROcodone -acetaminophen  (NORCO/VICODIN) 5-325 MG tablet  patient is not on the phone however son stated can put her on 3 way if need  to    Anita Perez Anita Perez-[phone] calling on pt behalf and is on the designated party release

## 2024-04-11 NOTE — Progress Notes (Signed)
 "  Acute Office Visit  Subjective:     Patient ID: Anita Perez, female    DOB: 1935/04/18, 89 y.o.   MRN: [id number]  Chief Complaint  Patient presents with   Leg Pain    Leg Pain     History of Present Illness   Anita Perez is an 89 year old female with degenerative disc disease and arthritis who presents with worsening leg pain. She is here with her son today.   She has been experiencing worsening leg pain, primarily in the left leg, leading to three hospital visits in the past week. The pain is described as aching and shooting. The pain radiates from her left hip to her toes. The pain is in her right lower leg. She rates her pain with a severity that has increased from a 7 to a 9 out of 10. It is most severe in the morning, making it difficult for her to walk independently.  She reports stiffness in both legs, particularly in the left knee and left hip. She experiences tingling in her toes.  She has a history of degenerative disc disease, which has worsened according to recent imaging. She also has mild arthritis in her hips and knees. She has been receiving injections for her back pain with neurosurgery, approximately every three months, with the last injection in October. She has an appointment for follow up with neurosurgery in a few weeks. She also has been referred to a podiatry and vascular specialist with an appointment in a few weeks.   Her current medications include hydrocodone , which she takes TID, nabumetone , Flexeril , and gabapentin . She is requesting an increase in her hydrocodone  dosage.       ROS As per HPI.      Objective:    BP (!) 141/70 Comment: at home reading per pt  Pulse 80   Temp 98.6 F (37 C) (Temporal)   Ht 5' 3.5 (1.613 m)   Wt 131 lb 3.2 oz (59.5 kg)   SpO2 98%   BMI 22.88 kg/m    Physical Exam Vitals and nursing note reviewed.  Constitutional:      General: She is not in acute distress.    Appearance: She is not ill-appearing,  toxic-appearing or diaphoretic.  Cardiovascular:     Rate and Rhythm: Normal rate and regular rhythm.     Heart sounds: Normal heart sounds. No murmur heard. Pulmonary:     Effort: Pulmonary effort is normal. No respiratory distress.     Breath sounds: Normal breath sounds. No wheezing, rhonchi or rales.  Musculoskeletal:     Right lower leg: No edema.     Left lower leg: No edema.  Skin:    General: Skin is warm and dry.  Neurological:     Mental Status: She is alert and oriented to person, place, and time. Mental status is at baseline.     Gait: Gait abnormal (antalgic gait, using cane).  Psychiatric:        Mood and Affect: Mood normal.        Behavior: Behavior normal.     No results found for any visits on 04/11/24.      Assessment & Plan:   Anita Perez was seen today for leg pain.  Diagnoses and all orders for this visit:  Lumbar radiculopathy  Polyneuropathy  Primary osteoarthritis involving multiple joints   Assessment and Plan    Lumbar degenerative disc disease with radiculopathy, polyneuropathy, and chronic pain Chronic lumbar degenerative disc  disease with radiculopathy causing severe pain, primarily in the left leg. Current pain management includes hydrocodone , nabumetone , Flexeril , and gabapentin . - Continue current regimen. Discussed case with supervising physician. Unable to increase opiate dosage.  - Offered referral to pain management clinic for further evaluation and management. States they have been referred and will call to schedule an appointment. - Advised contacting neurosurgeon to explore earlier appointment for epidural injection. - Follow up with podiatry appointment on January 28th for further evaluation.  Osteoarthritis of hip and knee Mild osteoarthritis in the hip and knee contributing to stiffness and pain. Imaging shows mild arthritis in the knees and mild hip arthritis. - Continue current pain management regimen. - Heat, gentle  stretching.      Keep scheduled follow ups.   The patient indicates understanding of these issues and agrees with the plan.  Anita CHRISTELLA Search, FNP   "

## 2024-04-12 ENCOUNTER — Encounter (HOSPITAL_COMMUNITY): Payer: Self-pay

## 2024-04-12 ENCOUNTER — Emergency Department (HOSPITAL_COMMUNITY)

## 2024-04-12 ENCOUNTER — Emergency Department (HOSPITAL_COMMUNITY)
Admission: EM | Admit: 2024-04-12 | Discharge: 2024-04-13 | Disposition: A | Discharge status: Home / Self Care | Attending: Emergency Medicine | Admitting: Emergency Medicine

## 2024-04-12 ENCOUNTER — Other Ambulatory Visit: Payer: Self-pay

## 2024-04-12 DIAGNOSIS — Z7982 Long term (current) use of aspirin: Secondary | ICD-10-CM | POA: Insufficient documentation

## 2024-04-12 DIAGNOSIS — G8929 Other chronic pain: Secondary | ICD-10-CM | POA: Insufficient documentation

## 2024-04-12 DIAGNOSIS — M5440 Lumbago with sciatica, unspecified side: Secondary | ICD-10-CM | POA: Insufficient documentation

## 2024-04-12 DIAGNOSIS — N390 Urinary tract infection, site not specified: Secondary | ICD-10-CM | POA: Insufficient documentation

## 2024-04-12 LAB — I-STAT CHEM 8, ED
BUN: 22 mg/dL (ref 8–23)
Calcium, Ion: 1.17 mmol/L (ref 1.15–1.40)
Chloride: 104 mmol/L (ref 98–111)
Creatinine, Ser: 1 mg/dL (ref 0.44–1.00)
Glucose, Bld: 102 mg/dL — ABNORMAL HIGH (ref 70–99)
HCT: 36 % (ref 36.0–46.0)
Hemoglobin: 12.2 g/dL (ref 12.0–15.0)
Potassium: 4.3 mmol/L (ref 3.5–5.1)
Sodium: 142 mmol/L (ref 135–145)
TCO2: 29 mmol/L (ref 22–32)

## 2024-04-12 LAB — CBC WITH DIFFERENTIAL/PLATELET
Abs Immature Granulocytes: 0.02 K/uL (ref 0.00–0.07)
Basophils Absolute: 0.1 K/uL (ref 0.0–0.1)
Basophils Relative: 1 %
Eosinophils Absolute: 0.2 K/uL (ref 0.0–0.5)
Eosinophils Relative: 3 %
HCT: 37.3 % (ref 36.0–46.0)
Hemoglobin: 11.3 g/dL — ABNORMAL LOW (ref 12.0–15.0)
Immature Granulocytes: 0 %
Lymphocytes Relative: 27 %
Lymphs Abs: 1.7 K/uL (ref 0.7–4.0)
MCH: 26.8 pg (ref 26.0–34.0)
MCHC: 30.3 g/dL (ref 30.0–36.0)
MCV: 88.4 fL (ref 80.0–100.0)
Monocytes Absolute: 0.6 K/uL (ref 0.1–1.0)
Monocytes Relative: 10 %
Neutro Abs: 3.6 K/uL (ref 1.7–7.7)
Neutrophils Relative %: 59 %
Platelets: 285 K/uL (ref 150–400)
RBC: 4.22 MIL/uL (ref 3.87–5.11)
RDW: 15.1 % (ref 11.5–15.5)
WBC: 6.2 K/uL (ref 4.0–10.5)
nRBC: 0 % (ref 0.0–0.2)

## 2024-04-12 LAB — BASIC METABOLIC PANEL WITH GFR
Anion gap: 7 (ref 5–15)
BUN: 20 mg/dL (ref 8–23)
CO2: 29 mmol/L (ref 22–32)
Calcium: 8.9 mg/dL (ref 8.9–10.3)
Chloride: 106 mmol/L (ref 98–111)
Creatinine, Ser: 0.82 mg/dL (ref 0.44–1.00)
GFR, Estimated: >60 mL/min
Glucose, Bld: 102 mg/dL — ABNORMAL HIGH (ref 70–99)
Potassium: 4.5 mmol/L (ref 3.5–5.1)
Sodium: 142 mmol/L (ref 135–145)

## 2024-04-12 LAB — URINALYSIS, W/ REFLEX TO CULTURE (INFECTION SUSPECTED)
Bilirubin Urine: NEGATIVE
Glucose, UA: NEGATIVE mg/dL
Hgb urine dipstick: NEGATIVE
Ketones, ur: NEGATIVE mg/dL
Nitrite: POSITIVE — AB
Protein, ur: 30 mg/dL — AB
Specific Gravity, Urine: 1.027 (ref 1.005–1.030)
WBC, UA: >50 WBC/hpf (ref 0–5)
pH: 5 (ref 5.0–8.0)

## 2024-04-12 LAB — URINE DRUG SCREEN
Amphetamines: NEGATIVE
Barbiturates: NEGATIVE
Benzodiazepines: NEGATIVE
Cocaine: NEGATIVE
Fentanyl: NEGATIVE
Methadone Scn, Ur: NEGATIVE
Opiates: POSITIVE — AB
Tetrahydrocannabinol: NEGATIVE

## 2024-04-12 MED ORDER — GADOBUTROL 1 MMOL/ML IV SOLN
5.5000 mL | Freq: Once | INTRAVENOUS | Status: AC | PRN
  Administered 2024-04-12: 5.5 mL via INTRAVENOUS

## 2024-04-12 MED ORDER — SODIUM CHLORIDE 0.9 % IV SOLN
1.0000 g | Freq: Once | INTRAVENOUS | Status: AC
  Administered 2024-04-12: 1 g via INTRAVENOUS
  Filled 2024-04-12: qty 10

## 2024-04-12 NOTE — ED Notes (Signed)
 Dr. Laurice is in the room with the pt and pt's family.

## 2024-04-12 NOTE — ED Provider Notes (Signed)
 Care of patient received from prior provider at 11:38 PM, please see their note for complete H/P and care plan.  Received handoff per ED course.  Clinical Course as of 04/13/24 0544  Tue Apr 12, 2024  2337 HO PCM Low back pain. ?flank pain instead  CT and MRI and dispo per.   [CC]  Wed Apr 13, 2024  0110 CT ABDOMEN PELVIS W CONTRAST [CC]    Clinical Course User Index [CC] Jerral Meth, MD   Reassessment: Patient has mild disc extrusion on her MRI.  No severe stenosis visible.  Clinically, patient symptoms are very subacute.  She already follows with the spine center. No acute neurologic changes at this time.  Worsening flank pain likely more related to UTI. She has been aggressively treated with Rocephin .  Recommended admission due to possible clinical overlap of pyelonephritis and patient/family declined stating they feel she may manage with oral antibiotics.  We discussed return precautions and family/patient in agreement.   Jerral Meth, MD 04/13/24 [phone]

## 2024-04-12 NOTE — ED Provider Notes (Signed)
 " [facility] AT [facility] Provider Note   CSN: [id number] Arrival date & time: 04/12/24  2016     Patient presents with: Leg Pain   Anita Perez is a 89 y.o. female.  {Add pertinent medical, surgical, social history, OB history to HPI:9019} 89 year old female with prior medical history as detailed below presents for evaluation.  Patient reports that she has chronic low back pain.  She reports that her low back has been pain present for at least the last 6 months.  She is on hydrocodone  and gabapentin  at baseline.  However, over the last 2 to 3 weeks she has noted increased pain.  The pain radiates down both legs.  Over the last week she is having significant difficulty ambulating because of the pain and weakness in her legs.  Her pain is not controlled well with hydrocodone  and gabapentin   Her last MRI was performed in January 2024.  This MRI demonstrated:  1. Generalized lumbar spine degeneration with scoliosis and L5-S1 mild anterolisthesis. Compressive spinal stenosis at L2-3 and L3-4, mildly progressed at L2-3 when compared to 2021. 2. L4-5 prior decompression with patent central canal. Left more than right subarticular recess stenosis at this level. 3. High-grade foraminal stenosis bilaterally at L4-5. Moderate foraminal narrowings at L2-3, L3-4, and L5-S1.  She has had multiple evaluations at multiple facilities for the same complaint over the last month.  She denies fever.  She denies difficulty urinating.  She denies change in bowel movements.  The history is provided by the patient and medical records.       Prior to Admission medications  Medication Sig Start Date End Date Taking? Authorizing Provider  alendronate  (FOSAMAX ) 70 MG tablet Take 1 tablet (70 mg total) by mouth every 7 (seven) days. Take with a full glass of water on an empty stomach. 02/16/23   Zollie Lowers, MD  Alpha-Lipoic Acid 200 MG TABS Take by mouth.     [provider]  amLODipine  (NORVASC ) 10 MG tablet Take 1 tablet (10 mg total) by mouth daily. 02/24/24   Zollie Lowers, MD  aspirin  81 MG tablet Take 81 mg by mouth daily.    [provider]  atorvastatin  (LIPITOR) 80 MG tablet Take 1 tablet (80 mg total) by mouth daily. 11/24/22   Zollie Lowers, MD  Calcium  Carbonate-Vitamin D  (CALCIUM  + D PO) Take 600 mg by mouth daily.    [provider]  Cholecalciferol (VITAMIN D ) 2000 units CAPS Take 2,000 Units by mouth daily.    [provider]  cyclobenzaprine  (FLEXERIL ) 5 MG tablet Take one tab PO HS PRN muscle pain 04/01/24   Pauline Garnette LABOR, MD  gabapentin  (NEURONTIN ) 600 MG tablet Take 2 tablets (1,200 mg total) by mouth 2 (two) times daily. 09/14/23   Zollie Lowers, MD  HYDROcodone -acetaminophen  (NORCO/VICODIN) 5-325 MG tablet Take 1 tablet by mouth 3 (three) times daily as needed for moderate pain (pain score 4-6). 03/25/24   Zollie Lowers, MD  HYDROcodone -acetaminophen  (NORCO/VICODIN) 5-325 MG tablet Take 1 tablet by mouth 3 (three) times daily as needed for moderate pain (pain score 4-6). 04/23/24   Zollie Lowers, MD  HYDROcodone -acetaminophen  (NORCO/VICODIN) 5-325 MG tablet Take 1 tablet by mouth 3 (three) times daily as needed for moderate pain (pain score 4-6). 05/24/24   Zollie Lowers, MD  HYDROcodone -acetaminophen  (NORCO/VICODIN) 5-325 MG tablet Take 1 tablet by mouth every 4 (four) hours as needed. 04/04/24   Beverley Leita LABOR, PA-C  meclizine  (ANTIVERT ) 12.5 MG  tablet Take 0.5 tablets (6.25 mg total) by mouth 3 (three) times daily as needed for dizziness. 02/24/24   Zollie Lowers, MD  megestrol  (MEGACE ) 400 MG/10ML suspension Take 10 mLs (400 mg total) by mouth 2 (two) times daily. For appetite stimulation 02/16/23   Zollie Lowers, MD  Multiple Vitamin (MULTIVITAMIN) tablet Take 1 tablet by mouth daily.    [provider]  nabumetone  (RELAFEN ) 500 MG tablet Take 1 tablet (500 mg total) by mouth 2  (two) times daily. For muscle and joint pain 11/26/23   Zollie Lowers, MD  neomycin -polymyxin-hydrocortisone (CORTISPORIN) OTIC solution Place 4 drops into both ears 4 (four) times daily. 06/24/23   Zollie Lowers, MD  pantoprazole  (PROTONIX ) 40 MG tablet Take 1 tablet (40 mg total) by mouth daily. For stomach 06/11/23   Zollie Lowers, MD  polyethylene glycol powder (GLYCOLAX /MIRALAX ) 17 GM/SCOOP powder DISSOLVE 17 GRAMS IN 8 OZ OF FLUID LIQUID DRINK DAILY AS DIRECTED 08/13/20   Zollie Lowers, MD  raloxifene  (EVISTA ) 60 MG tablet TAKE 1 TABLET ONCE DAILY (FOR BONE HEALTH AND BREAST CANCER PREVENTION) 02/16/23   Zollie Lowers, MD    Allergies: Diclofenac , Lyrica  [pregabalin ], Pepto-bismol [bismuth], and Ropinirole     Review of Systems  All other systems reviewed and are negative.   Updated Vital Signs BP (!) 145/75   Pulse 73   Temp 98.3 F (36.8 C) (Oral)   Resp 18   Ht 5' 3 (1.6 m)   Wt 59.4 kg   SpO2 99%   BMI 23.21 kg/m   Physical Exam Vitals and nursing note reviewed.  Constitutional:      General: She is not in acute distress.    Appearance: She is well-developed.  HENT:     Head: Normocephalic and atraumatic.  Eyes:     Conjunctiva/sclera: Conjunctivae normal.  Cardiovascular:     Rate and Rhythm: Normal rate and regular rhythm.     Heart sounds: No murmur heard. Pulmonary:     Effort: Pulmonary effort is normal. No respiratory distress.     Breath sounds: Normal breath sounds.  Abdominal:     Palpations: Abdomen is soft.     Tenderness: There is no abdominal tenderness.  Musculoskeletal:        General: No swelling.     Cervical back: Neck supple.  Skin:    General: Skin is warm and dry.     Capillary Refill: Capillary refill takes less than 2 seconds.  Neurological:     Mental Status: She is alert and oriented to person, place, and time.  Psychiatric:        Mood and Affect: Mood normal.     (all labs ordered are listed, but only abnormal results are  displayed) Labs Reviewed  CBC WITH DIFFERENTIAL/PLATELET  BASIC METABOLIC PANEL WITH GFR  URINALYSIS, W/ REFLEX TO CULTURE (INFECTION SUSPECTED)  URINE DRUG SCREEN  I-STAT CHEM 8, ED    EKG: None  Radiology: No results found.  {Document cardiac monitor, telemetry assessment procedure when appropriate:32947} Procedures   Medications Ordered in the ED - No data to display    {Click here for ABCD2, HEART and other calculators REFRESH Note before signing:1}                              Medical Decision Making Patient with chronic low back pain.  Patient reports increasing low back pain and discomfort with worsening ability to ambulate secondary to same.  She has had multiple evaluations at multiple facilities over the last month.  History and exam are concerning giving worsening symptoms.  MRI L-spine ordered.  Obtained workup is otherwise significant for likely UTI.  Rocephin  administered out of concern for infection.  Will obtain CT imaging to evaluate for pyelonephritis.  Oncoming ED provider is aware of case.  Amount and/or Complexity of Data Reviewed Labs: ordered. Radiology: ordered.  Risk Prescription drug management.   ***  {Document critical care time when appropriate  Document review of labs and clinical decision tools ie CHADS2VASC2, etc  Document your independent review of radiology images and any outside records  Document your discussion with family members, caretakers and with consultants  Document social determinants of health affecting pt's care  Document your decision making why or why not admission, treatments were needed:32947:::1}   Final diagnoses:  Chronic low back pain with sciatica, sciatica laterality unspecified, unspecified back pain laterality  Urinary tract infection without hematuria, site unspecified    ED Discharge Orders     None        "

## 2024-04-12 NOTE — ED Triage Notes (Signed)
 Pt arrive POV for bilateral leg pain for the past 3 weeks not getting any better, not able to follow up with PCP. Pt is being seen on different ED for same c/o.

## 2024-04-12 NOTE — ED Triage Notes (Addendum)
 Pt to ED c/o bilat leg pain x 3 weeks, progressively getting worse, reports evaluated 4x for same problem. Evaluated at PCP, [facility], [facility] Salmon, [facility]. Reports imaging obtained.  Reports prescribed pain med and gabapentin  with no relief. Denies bowel /bladder incontinence.

## 2024-04-12 NOTE — ED Provider Triage Note (Signed)
 Emergency Medicine Provider Triage Evaluation Note  Anita Perez , a 89 y.o. female  was evaluated in triage.  Pt complains of increasing bilateral leg pain and stiffness for the past 3 to 4 weeks.  Denies fall or injury.  Has been seen by multiple doctors and had negative pictures.  States pain is in the hips and radiates down both legs.  Denies back pain.  She is on oxycodone  and gabapentin  with minimal relief.  States did see a PCP yesterday but has not seen pain management or neurosurgeon  Review of Systems  Positive: Leg pain Negative: Back pain, incontinence, leg swelling  Physical Exam  BP (!) 184/75 (BP Location: Left Arm)   Pulse 86   Temp 99.1 F (37.3 C)   Resp 16   Ht 5' 3 (1.6 m)   Wt 59.4 kg   SpO2 95%   BMI 23.21 kg/m  Gen:   Awake, no distress   Resp:  Normal effort  MSK:   Moves extremities without difficulty  Other:    Medical Decision Making  Medically screening exam initiated at 8:52 PM.  Appropriate orders placed.  Anita Perez was informed that the remainder of the evaluation will be completed by another provider, this initial triage assessment does not replace that evaluation, and the importance of remaining in the ED until their evaluation is complete.     Neysa Thersia RAMAN, [place] 04/12/24 2053

## 2024-04-13 ENCOUNTER — Emergency Department (HOSPITAL_COMMUNITY)

## 2024-04-13 MED ORDER — ACETAMINOPHEN 500 MG PO TABS
1000.0000 mg | ORAL_TABLET | Freq: Once | ORAL | Status: AC
  Administered 2024-04-13: 1000 mg via ORAL
  Filled 2024-04-13: qty 2

## 2024-04-13 MED ORDER — IOHEXOL 350 MG/ML SOLN
75.0000 mL | Freq: Once | INTRAVENOUS | Status: AC | PRN
  Administered 2024-04-13: 75 mL via INTRAVENOUS

## 2024-04-13 MED ORDER — CEFPODOXIME PROXETIL 200 MG PO TABS: 200.0000 mg | ORAL_TABLET | Freq: Two times a day (BID) | ORAL | 0 refills | Status: AC

## 2024-04-13 MED ORDER — OXYCODONE HCL 5 MG PO TABS
5.0000 mg | ORAL_TABLET | Freq: Once | ORAL | Status: AC
  Administered 2024-04-13: 5 mg via ORAL
  Filled 2024-04-13: qty 1

## 2024-04-14 LAB — URINE CULTURE: Culture: >=100000 — AB

## 2024-04-15 ENCOUNTER — Telehealth (HOSPITAL_BASED_OUTPATIENT_CLINIC_OR_DEPARTMENT_OTHER): Payer: Self-pay | Admitting: *Deleted

## 2024-04-15 NOTE — Telephone Encounter (Signed)
 Post ED Visit - Positive Culture Follow-up  Culture report reviewed by antimicrobial stewardship pharmacist: [facility] [x]  Leonor Bash, [place].D. []  Venetia Gully, Pharm.D., BCPS AQ-ID []  Garrel Crews, Pharm.D., BCPS []  Almarie Lunger, Pharm.D., BCPS []  [place], [address].D., BCPS, AAHIVP []  Rosaline Bihari, Pharm.D., BCPS, AAHIVP []  Vernell Meier, PharmD, BCPS []  Latanya Hint, PharmD, BCPS []  Donald Medley, PharmD, BCPS []  Rocky Bold, PharmD []  Dorothyann Alert, PharmD, BCPS []  Morene Babe, PharmD  [facility] []  Rosaline Edison, PharmD []  Romona Bliss, PharmD []  Dolphus Roller, PharmD []  Veva Seip, Rph []  Vernell Daunt) Leonce, PharmD []  Eva Allis, PharmD []  Rosaline Millet, PharmD []  Iantha Batch, PharmD []  Arvin Gauss, PharmD []  Wanda Hasting, PharmD []  Ronal Rav, PharmD []  Rocky Slade, PharmD []  Bard Jeans, PharmD   Positive urine culture Treated with Cefpodoxime , organism sensitive to the same and no further patient follow-up is required at this time.  Anita Perez 04/15/2024, 1:20 PM

## 2024-04-19 ENCOUNTER — Ambulatory Visit: Payer: Self-pay | Admitting: Family Medicine

## 2024-04-19 ENCOUNTER — Ambulatory Visit (INDEPENDENT_AMBULATORY_CARE_PROVIDER_SITE_OTHER): Admitting: Family Medicine

## 2024-04-19 ENCOUNTER — Encounter: Payer: Self-pay | Admitting: Family Medicine

## 2024-04-19 VITALS — BP 132/71 | HR 75 | Temp 98.1°F | Ht 63.0 in | Wt 132.0 lb

## 2024-04-19 DIAGNOSIS — R300 Dysuria: Secondary | ICD-10-CM | POA: Diagnosis not present

## 2024-04-19 DIAGNOSIS — M5416 Radiculopathy, lumbar region: Secondary | ICD-10-CM | POA: Diagnosis not present

## 2024-04-19 LAB — URINALYSIS, COMPLETE
Bilirubin, UA: NEGATIVE
Glucose, UA: NEGATIVE
Nitrite, UA: NEGATIVE
RBC, UA: NEGATIVE
Specific Gravity, UA: 1.025 (ref 1.005–1.030)
Urobilinogen, Ur: 0.2 mg/dL (ref 0.2–1.0)
pH, UA: 5.5 (ref 5.0–7.5)

## 2024-04-19 LAB — MICROSCOPIC EXAMINATION: RBC, Urine: NONE SEEN /HPF (ref 0–2)

## 2024-04-19 MED ORDER — HYDROCODONE-ACETAMINOPHEN 7.5-325 MG PO TABS: 1.0000 | ORAL_TABLET | Freq: Four times a day (QID) | ORAL | 0 refills | Status: DC | PRN

## 2024-04-19 NOTE — Progress Notes (Signed)
 "  Subjective:  Patient ID: Anita Perez, female    DOB: May 27, 1935  Age: 89 y.o. MRN: [id number]  CC: Leg Pain (Sharp achy pain from knees down to toes. Numbness and tingling in feet. Worse in morning before taking meds. Oxycodone  given at hospital helped better then hydrocodone . ), Medical Management of Chronic Issues (Multiple hospital visits since seeing you last. MRI, scan, and uti treatment. ), and Urinary Tract Infection (Already treated but still having pain with urination. )   HPI  Discussed the use of AI scribe software for clinical note transcription with the patient, who gave verbal consent to proceed.  History of Present Illness Anita Perez is an 89 year old female with a herniated disc and severe arthritis who presents with severe pain in the left hip, legs, and toes.  She experiences severe pain in her left hip, both legs, and toes, described as extremely severe, preventing her from getting out of bed. The pain is most intense in the morning and improves slightly with movement and medication.  She has sought relief at four hospitals and consulted multiple doctors, including Dr. Jaycee and Dr. Jerral at [facility]. An MRI was conducted on April 13, 2024. She was told she has a herniated disc and severe arthritis.  She has been given a dose of oxycodone  in the E.D., which provided significant relief compared to hydrocodone , which was less effective. She also received a morphine  injection in her hip at [facility], which provided temporary relief. She currently takes hydrocodone  every four hours as needed and gabapentin  for general pain relief. Despite this, she continues to experience significant pain throughout the day.  She has also been using a pain-relieving cream recommended by a friend, although the main ingredient is unknown. She has not tried diclofenac  cream (Voltaren ) for pain relief.          04/19/2024    2:13 PM 03/16/2024    3:25 PM 02/24/2024    2:00  PM  Depression screen PHQ 2/9  Decreased Interest 0 0 1  Down, Depressed, Hopeless 0 0 0  PHQ - 2 Score 0 0 1  Altered sleeping 0 0 0  Tired, decreased energy 1 0 1  Change in appetite 0 0 0  Feeling bad or failure about yourself  0 0 0  Trouble concentrating 0 0 0  Moving slowly or fidgety/restless 0 0 0  Suicidal thoughts 0 0 0  PHQ-9 Score 1 0 2  Difficult doing work/chores Not difficult at all Not difficult at all Somewhat difficult    [name] has a past medical history of Arthritis, Colon polyps, DDD (degenerative disc disease), lumbar, Diverticulitis (1990s), GERD (gastroesophageal reflux disease), Hiatal hernia, Hyperlipidemia, Hypertension, Osteopenia, Rectal carcinoma (HCC), SBO (small bowel obstruction) (HCC) (04/25/2016), and Small bowel obstruction (HCC) (04/25/2016).   She has a past surgical history that includes intestinal  tear; Appendectomy; Cosmetic surgery (Left); Lumbar laminectomy/decompression microdiscectomy (Left, 06/14/2013); Knee arthroscopy (Right); Tumor removal (Left); Back surgery; Breast cyst excision (Left); Colostomy (1990s); Colostomy takedown (1990s); Colonoscopy w/ biopsies and polypectomy; and Colon surgery.   Her family history includes Cancer in her daughter, sister, and sister; Early death in her brother; Hypertension in her father and mother; Kidney disease in her father and mother.She reports that she quit smoking about 41 years ago. Her smoking use included cigarettes. She started smoking about 65 years ago. She has a 2.9 pack-year smoking history. She quit smokeless tobacco use about 78 years ago.  Her  smokeless tobacco use included snuff. She reports that she does not currently use alcohol. She reports that she does not use drugs.    ROS Review of Systems  Objective:  BP 132/71   Pulse 75   Temp 98.1 F (36.7 C)   Ht 5' 3 (1.6 m)   Wt 132 lb (59.9 kg)   SpO2 96%   BMI 23.38 kg/m   BP Readings from Last 3 Encounters:  04/19/24  132/71  04/13/24 (!) 179/69  04/11/24 (!) 141/70    Wt Readings from Last 3 Encounters:  04/19/24 132 lb (59.9 kg)  04/12/24 131 lb (59.4 kg)  04/11/24 131 lb 3.2 oz (59.5 kg)     Physical Exam Constitutional:      General: She is not in acute distress.    Appearance: She is well-developed. She is ill-appearing.  Musculoskeletal:        General: Normal range of motion.  Skin:    General: Skin is warm and dry.  Neurological:     Mental Status: She is alert and oriented to person, place, and time.    Physical Exam GENERAL: Alert, cooperative, well developed, no acute distress HEENT: Normocephalic, normal oropharynx, moist mucous membranes CHEST: Clear to auscultation bilaterally, No wheezes, rhonchi, or crackles CARDIOVASCULAR: Normal heart rate and rhythm, S1 and S2 normal without murmurs ABDOMEN: Soft, non-tender, non-distended, without organomegaly, Normal bowel sounds EXTREMITIES: No cyanosis or edema NEUROLOGICAL: Cranial nerves grossly intact, Moves all extremities without gross motor or sensory deficit   Assessment & Plan:  Dysuria -     Urinalysis, Complete -     Urine Culture  Lumbar radiculopathy -     HYDROcodone -Acetaminophen ; Take 1 tablet by mouth every 6 (six) hours as needed for up to 15 days for severe pain (pain score 7-10).  Dispense: 60 tablet; Refill: 0    Assessment and Plan Assessment & Plan Lumbar radiculopathy due to herniated disc   Severe lumbar radiculopathy from a herniated disc is causing nerve compression and severe pain in the left hip, legs, and toes. MRI confirmed the herniated disc with nerve compression. Pain is most severe in the morning and improves slightly with movement. Current pain management with hydrocodone  and gabapentin  is inadequate, though oxycodone  provided temporary relief. Surgical intervention is unlikely due to age and comorbidities, but a referral to a neurosurgeon is considered to explore all options. She is open to  surgery if deemed feasible by the neurosurgeon. Increase hydrocodone  dosage by 50% and frequency to four times a day. Refer to a neurosurgeon for evaluation of surgical options. Continue gabapentin  for neuropathic pain management. Prescribe diclofenac  cream for topical pain relief.  Osteoarthritis of hip   Severe osteoarthritis in the hip contributes to pain, as shown by MRI. Surgical intervention is unlikely due to age and comorbidities. Current management focuses on pain control. Continue the current pain management regimen with hydrocodone  and gabapentin . Consider topical diclofenac  cream for additional pain relief.       Follow-up: Return in about 2 weeks (around 05/03/2024).  Butler Der, M.D. "

## 2024-04-21 ENCOUNTER — Emergency Department (HOSPITAL_COMMUNITY)
Admission: EM | Admit: 2024-04-21 | Discharge: 2024-04-21 | Disposition: A | Discharge status: Home / Self Care | Attending: Emergency Medicine | Admitting: Emergency Medicine

## 2024-04-21 ENCOUNTER — Other Ambulatory Visit: Payer: Self-pay

## 2024-04-21 ENCOUNTER — Encounter (HOSPITAL_COMMUNITY): Payer: Self-pay

## 2024-04-21 DIAGNOSIS — M79604 Pain in right leg: Secondary | ICD-10-CM | POA: Diagnosis present

## 2024-04-21 DIAGNOSIS — M79605 Pain in left leg: Secondary | ICD-10-CM | POA: Insufficient documentation

## 2024-04-21 DIAGNOSIS — Z7982 Long term (current) use of aspirin: Secondary | ICD-10-CM | POA: Insufficient documentation

## 2024-04-21 LAB — CBC WITH DIFFERENTIAL/PLATELET
Abs Immature Granulocytes: 0.03 K/uL (ref 0.00–0.07)
Basophils Absolute: 0.1 K/uL (ref 0.0–0.1)
Basophils Relative: 1 %
Eosinophils Absolute: 0 K/uL (ref 0.0–0.5)
Eosinophils Relative: 0 %
HCT: 38.4 % (ref 36.0–46.0)
Hemoglobin: 12.1 g/dL (ref 12.0–15.0)
Immature Granulocytes: 0 %
Lymphocytes Relative: 15 %
Lymphs Abs: 1.1 K/uL (ref 0.7–4.0)
MCH: 26.8 pg (ref 26.0–34.0)
MCHC: 31.5 g/dL (ref 30.0–36.0)
MCV: 85.1 fL (ref 80.0–100.0)
Monocytes Absolute: 0.6 K/uL (ref 0.1–1.0)
Monocytes Relative: 9 %
Neutro Abs: 5.5 K/uL (ref 1.7–7.7)
Neutrophils Relative %: 75 %
Platelets: 304 K/uL (ref 150–400)
RBC: 4.51 MIL/uL (ref 3.87–5.11)
RDW: 14.6 % (ref 11.5–15.5)
WBC: 7.4 K/uL (ref 4.0–10.5)
nRBC: 0 % (ref 0.0–0.2)

## 2024-04-21 LAB — URINALYSIS, ROUTINE W REFLEX MICROSCOPIC
Bilirubin Urine: NEGATIVE
Glucose, UA: NEGATIVE mg/dL
Hgb urine dipstick: NEGATIVE
Ketones, ur: 20 mg/dL — AB
Leukocytes,Ua: NEGATIVE
Nitrite: NEGATIVE
Protein, ur: NEGATIVE mg/dL
Specific Gravity, Urine: 1.014 (ref 1.005–1.030)
pH: 8 (ref 5.0–8.0)

## 2024-04-21 LAB — BASIC METABOLIC PANEL WITH GFR
Anion gap: 13 (ref 5–15)
BUN: 11 mg/dL (ref 8–23)
CO2: 25 mmol/L (ref 22–32)
Calcium: 9.2 mg/dL (ref 8.9–10.3)
Chloride: 100 mmol/L (ref 98–111)
Creatinine, Ser: 0.73 mg/dL (ref 0.44–1.00)
GFR, Estimated: >60 mL/min
Glucose, Bld: 98 mg/dL (ref 70–99)
Potassium: 3.9 mmol/L (ref 3.5–5.1)
Sodium: 137 mmol/L (ref 135–145)

## 2024-04-21 MED ORDER — DEXAMETHASONE 4 MG PO TABS
10.0000 mg | ORAL_TABLET | Freq: Once | ORAL | Status: AC
  Administered 2024-04-21: 10 mg via ORAL
  Filled 2024-04-21: qty 3

## 2024-04-21 MED ORDER — KETOROLAC TROMETHAMINE 15 MG/ML IJ SOLN
15.0000 mg | Freq: Once | INTRAMUSCULAR | Status: AC
  Administered 2024-04-21: 15 mg via INTRAMUSCULAR
  Filled 2024-04-21: qty 1

## 2024-04-21 MED ORDER — OXYCODONE-ACETAMINOPHEN 5-325 MG PO TABS
1.0000 | ORAL_TABLET | Freq: Once | ORAL | Status: AC
  Administered 2024-04-21: 1 via ORAL
  Filled 2024-04-21: qty 1

## 2024-04-21 MED ORDER — MELOXICAM 7.5 MG PO TABS: 7.5000 mg | ORAL_TABLET | Freq: Every day | ORAL | 0 refills | Status: AC

## 2024-04-21 NOTE — ED Triage Notes (Signed)
 C/O bilateral foot and leg pain. Denies falling/strenuous activity. Axox4. Pt ambulatory.

## 2024-04-21 NOTE — Discharge Instructions (Addendum)
 Thank you for letting us  take care of you today.  You came in today because of ongoing leg pain.  We think your symptoms are related to a combination of both arthritis as well as symptoms from your herniated disc.  We did give you steroids here in the emergency department and we will also send you home with an anti-inflammatory medication called Mobic .  Please follow-up with your spine surgeon on Monday for further evaluation.

## 2024-04-21 NOTE — ED Provider Notes (Signed)
 " [place] EMERGENCY DEPARTMENT AT [facility] Provider Note   CSN: [id number] Arrival date & time: 04/21/24  1432     Patient presents with: Leg Pain   Anita Perez is a 89 y.o. female who presents today for evaluation of worsening bilateral lower extremity pain.  Patient has had ongoing lower extremity pain for some period of time now.  Has been to multiple hospitals for evaluation of similar.  Overall presentation felt related to be multifactorial likely secondary to severe arthritis as well as a recent discovery of herniated disc on MRI.  Patient has been using outpatient hydrocodone , gabapentin  without significant relief.  Has also been using topical diclofenac .  Due to persistent pain is presenting today for further evaluation.  At this point in time patient rates her pain as 8 out of 10.  Describes bilateral lower extremity pain primarily distal to the knee.  Denies any significant upper leg or back pain at this point in time.  No recent trauma.   Leg Pain      Prior to Admission medications  Medication Sig Start Date End Date Taking? Authorizing Provider  alendronate  (FOSAMAX ) 70 MG tablet Take 1 tablet (70 mg total) by mouth every 7 (seven) days. Take with a full glass of water on an empty stomach. 02/16/23   Zollie Lowers, MD  Alpha-Lipoic Acid 200 MG TABS Take by mouth.    [provider]  amLODipine  (NORVASC ) 10 MG tablet Take 1 tablet (10 mg total) by mouth daily. 02/24/24   Zollie Lowers, MD  aspirin  81 MG tablet Take 81 mg by mouth daily.    [provider]  atorvastatin  (LIPITOR) 80 MG tablet Take 1 tablet (80 mg total) by mouth daily. 11/24/22   Zollie Lowers, MD  Calcium  Carbonate-Vitamin D  (CALCIUM  + D PO) Take 600 mg by mouth daily.    [provider]  cefpodoxime  (VANTIN ) 200 MG tablet Take 1 tablet (200 mg total) by mouth 2 (two) times daily. 04/13/24   Countryman, Chase, MD  Cholecalciferol (VITAMIN D ) 2000 units CAPS Take  2,000 Units by mouth daily.    [provider]  cyclobenzaprine  (FLEXERIL ) 5 MG tablet Take one tab PO HS PRN muscle pain 04/01/24   Pauline Garnette LABOR, MD  gabapentin  (NEURONTIN ) 600 MG tablet Take 2 tablets (1,200 mg total) by mouth 2 (two) times daily. 09/14/23   Zollie Lowers, MD  HYDROcodone -acetaminophen  (NORCO) 7.5-325 MG tablet Take 1 tablet by mouth every 6 (six) hours as needed for up to 15 days for severe pain (pain score 7-10). 04/19/24 05/04/24  Zollie Lowers, MD  meclizine  (ANTIVERT ) 12.5 MG tablet Take 0.5 tablets (6.25 mg total) by mouth 3 (three) times daily as needed for dizziness. 02/24/24   Zollie Lowers, MD  megestrol  (MEGACE ) 400 MG/10ML suspension Take 10 mLs (400 mg total) by mouth 2 (two) times daily. For appetite stimulation 02/16/23   Zollie Lowers, MD  Multiple Vitamin (MULTIVITAMIN) tablet Take 1 tablet by mouth daily.    [provider]  nabumetone  (RELAFEN ) 500 MG tablet Take 1 tablet (500 mg total) by mouth 2 (two) times daily. For muscle and joint pain 11/26/23   Zollie Lowers, MD  neomycin -polymyxin-hydrocortisone (CORTISPORIN) OTIC solution Place 4 drops into both ears 4 (four) times daily. 06/24/23   Zollie Lowers, MD  pantoprazole  (PROTONIX ) 40 MG tablet Take 1 tablet (40 mg total) by mouth daily. For stomach 06/11/23   Zollie Lowers, MD  polyethylene glycol powder (GLYCOLAX /MIRALAX ) 17 GM/SCOOP  powder DISSOLVE 17 GRAMS IN 8 OZ OF FLUID LIQUID DRINK DAILY AS DIRECTED 08/13/20   Zollie Lowers, MD  raloxifene  (EVISTA ) 60 MG tablet TAKE 1 TABLET ONCE DAILY (FOR BONE HEALTH AND BREAST CANCER PREVENTION) 02/16/23   Zollie Lowers, MD    Allergies: Diclofenac , Lyrica  [pregabalin ], Pepto-bismol [bismuth], and Ropinirole     Review of Systems  Updated Vital Signs BP (!) 164/77   Pulse 89   Temp 97.6 F (36.4 C)   Resp 17   Ht 5' 3 (1.6 m)   Wt 59 kg   SpO2 98%   BMI 23.03 kg/m   Physical Exam Constitutional:      General: She is not in acute  distress.    Appearance: Normal appearance.  HENT:     Head: Normocephalic.     Right Ear: External ear normal.     Left Ear: External ear normal.     Nose: Nose normal.     Mouth/Throat:     Mouth: Mucous membranes are moist.  Eyes:     Pupils: Pupils are equal, round, and reactive to light.  Cardiovascular:     Rate and Rhythm: Normal rate.     Pulses: Normal pulses.  Pulmonary:     Effort: Pulmonary effort is normal.  Abdominal:     General: Abdomen is flat.  Musculoskeletal:        General: No swelling or tenderness. Normal range of motion.     Cervical back: Normal range of motion.  Skin:    General: Skin is warm and dry.     Capillary Refill: Capillary refill takes less than 2 seconds.     Findings: No rash.  Neurological:     General: No focal deficit present.     Mental Status: She is alert and oriented to person, place, and time.  Psychiatric:        Mood and Affect: Mood normal.     (all labs ordered are listed, but only abnormal results are displayed) Labs Reviewed  URINALYSIS, ROUTINE W REFLEX MICROSCOPIC - Abnormal; Notable for the following components:      Result Value   Ketones, ur 20 (*)    All other components within normal limits  BASIC METABOLIC PANEL WITH GFR  CBC WITH DIFFERENTIAL/PLATELET    EKG: None  Radiology: No results found.  Procedures   Medications Ordered in the ED  oxyCODONE -acetaminophen  (PERCOCET/ROXICET) 5-325 MG per tablet 1 tablet (1 tablet Oral Given 04/21/24 1618)                                 Medical Decision Making Risk Prescription drug management.   Patient is an 89 year old female who presents today for evaluation of ongoing lower extremity pain with prior diagnosis of severe arthritis as well as herniated disc.  On initial assessment patient was noted to be hemodynamically stable and afebrile.  On admission assessment patient was resting comfortable without acute distress.  Physical examination without any  acute findings.  Bilateral lower extremities without evidence of lesions or obvious trauma.  Extremities warm and well-perfused.  At the time my assessment patient already had laboratory evaluation is completed including unremarkable metabolic panel, CBC and noninfectious urinalysis.  Patient was provided Percocet, Toradol  as well as Decadron  here.  I still feel that patient symptoms likely multifactorial in setting of severe arthritis as well as disc herniation.  There is no red flag symptoms concerning for  acute spinal pathology at this time such as cauda equina or cord medullary so would warrant reevaluation on MRI.  No additional traumas that be concerning for acute fracture.  Her lower extremities are warm and well-perfused and I have no evidence for DVT or limb ischemia.  Symptoms not consistent with acute aortic pathology.  Will optimize outpatient pain regimen by adding Mobic  to her current regiment.  She already has follow-up with outpatient spine on Monday and encouraged to keep this appointment.  Return precautions discussed.  Patient discharged in stable condition.  Final diagnoses:  Pain in both lower extremities    ED Discharge Orders     None          Laurita Sieving, MD 04/21/24 2325    Lenor Hollering, MD 04/21/24 2330  "

## 2024-04-21 NOTE — ED Provider Triage Note (Signed)
 Emergency Medicine Provider Triage Evaluation Note  Anita Perez , a 89 y.o. female  was evaluated in triage.  Pt complains of worsening bilateral lower extremity pain, seen on 12 April 2024 for same and CT imaging and MRI did show some mild disc extrusion with no severe stenosis, pain is subacute, follows with neurosurgery and has upcoming appointment within the next week.  Reason they presented today is due to worsening pain that is not responding to current therapy.  Had been taking increasing doses of hydrocodone  but discontinued secondary to side effects.  It is noted that she also was treated for UTI during previous ED stay, declined admission at that time secondary to desire to manage with oral antibiotics and desire not to be hospitalized at the time.  Review of Systems  Positive: As above Negative:   Physical Exam  BP (!) 164/77   Pulse 89   Temp 97.6 F (36.4 C)   Resp 17   Ht 5' 3 (1.6 m)   Wt 59 kg   SpO2 98%   BMI 23.03 kg/m  Gen:   Awake, no distress   Resp:  Normal effort  MSK:   Moves extremities without difficulty  Other:    Medical Decision Making  Medically screening exam initiated at 3:42 PM.  Appropriate orders placed.  ROSSY VIRAG was informed that the remainder of the evaluation will be completed by another provider, this initial triage assessment does not replace that evaluation, and the importance of remaining in the ED until their evaluation is complete.  Initial orders placed for basic labs and urine, as well as initial pain management.   Myriam Dorn BROCK[place], [place] 04/21/24 1549

## 2024-05-02 ENCOUNTER — Telehealth: Payer: Self-pay

## 2024-05-02 NOTE — Telephone Encounter (Signed)
 Copied from CRM [id number]. Topic: Clinical - Medication Refill >> May 02, 2024 11:06 AM Harlene ORN wrote: Medication: meloxicam  (MOBIC ) 7.5 MG tablet, HYDROcodone -acetaminophen  (NORCO) 7.5-325 MG tablet  Has the patient contacted their pharmacy? No (Agent: If no, request that the patient contact the pharmacy for the refill. If patient does not wish to contact the pharmacy document the reason why and proceed with request.) (Agent: If yes, when and what did the pharmacy advise?)  This is the patient's preferred pharmacy:  [id number] - [place] - [address] [place] [address] Phone: [phone] Fax: [phone]  Is this the correct pharmacy for this prescription? Yes If no, delete pharmacy and type the correct one.   Has the prescription been filled recently? No  Is the patient out of the medication? No  Has the patient been seen for an appointment in the last year OR does the patient have an upcoming appointment? Yes  Can we respond through MyChart? No  Agent: Please be advised that Rx refills may take up to 3 business days. We ask that you follow-up with your pharmacy.

## 2024-05-02 NOTE — Telephone Encounter (Signed)
 Left message making pt aware that these medications will need to be discussed in an OV.  Meloxicam  is not prescribed by Dr. Zollie. The hydrocodone  is controlled and requires an in office visit.

## 2024-05-04 ENCOUNTER — Other Ambulatory Visit: Payer: Self-pay | Admitting: *Deleted

## 2024-05-04 ENCOUNTER — Encounter: Payer: Self-pay | Admitting: Family Medicine

## 2024-05-04 ENCOUNTER — Ambulatory Visit: Admitting: Family Medicine

## 2024-05-04 VITALS — BP 147/82 | HR 81 | Temp 97.4°F | Ht 63.0 in | Wt 129.1 lb

## 2024-05-04 DIAGNOSIS — M5416 Radiculopathy, lumbar region: Secondary | ICD-10-CM | POA: Diagnosis not present

## 2024-05-04 DIAGNOSIS — I10 Essential (primary) hypertension: Secondary | ICD-10-CM | POA: Diagnosis not present

## 2024-05-04 DIAGNOSIS — M5126 Other intervertebral disc displacement, lumbar region: Secondary | ICD-10-CM

## 2024-05-04 MED ORDER — HYDROCODONE-ACETAMINOPHEN 5-325 MG PO TABS: 1.0000 | ORAL_TABLET | Freq: Four times a day (QID) | ORAL | 0 refills | Status: AC

## 2024-05-04 NOTE — Progress Notes (Signed)
 "  Subjective:  Patient ID: Anita Perez, female    DOB: [birth year]  Age: 89 y.o. MRN: [id number]  CC: Medical Management of Chronic Issues   HPI  Discussed the use of AI scribe software for clinical note transcription with the patient, who gave verbal consent to proceed.  History of Present Illness Anita Perez is an 89 year old female who presents with worsening leg, foot, and hip pain.  She experiences severe pain in her legs, feet, and left hip, describing it as intolerable and affecting her ability to get out of bed. The pain in her legs is characterized by a burning sensation in the knees and tingling in the feet. These symptoms have been present for a long time and have recently worsened.  She is currently taking gabapentin  and hydrocodone  for pain management. The hydrocodone  was previously increased to 7.5 mg but was too strong, causing drowsiness, so she reverted to 5 mg. She takes the 5 mg dose every three to four hours when in pain, although her prescription allows for only three doses per day. She finds some relief with this regimen but notes that the pain returns after a few hours.  She was prescribed meloxicam  for inflammation, which she started last week after a hospital visit. She reports some relief from this medication. She also received antibiotics for an infection in her feet about three weeks ago, which has since cleared up.  Recent diagnostic work includes x-rays of her feet taken last Thursday, which she is awaiting results for. A CT scan of her abdomen and an MRI of her spine were performed in January. She recalls that the MRI of her spine was performed in January and that she was told there was arthritis and herniated discs, but she does not recall any blockages being found in her legs.  She has been receiving pain management treatment, including shots, from [facility], under the care of Dr. Dartha.  She is currently staying in [place].           05/04/2024   11:15 AM 04/19/2024    2:13 PM 03/16/2024    3:25 PM  Depression screen PHQ 2/9  Decreased Interest 0 0 0  Down, Depressed, Hopeless 0 0 0  PHQ - 2 Score 0 0 0  Altered sleeping 0 0 0  Tired, decreased energy 0 1 0  Change in appetite 0 0 0  Feeling bad or failure about yourself  0 0 0  Trouble concentrating 0 0 0  Moving slowly or fidgety/restless 0 0 0  Suicidal thoughts 0 0 0  PHQ-9 Score 0 1 0  Difficult doing work/chores Very difficult Not difficult at all Not difficult at all    [name] has a past medical history of Arthritis, Colon polyps, DDD (degenerative disc disease), lumbar, Diverticulitis (1990s), GERD (gastroesophageal reflux disease), Hiatal hernia, Hyperlipidemia, Hypertension, Osteopenia, Rectal carcinoma (HCC), SBO (small bowel obstruction) (HCC) (04/25/2016), and Small bowel obstruction (HCC) (04/25/2016).   She has a past surgical history that includes intestinal  tear; Appendectomy; Cosmetic surgery (Left); Lumbar laminectomy/decompression microdiscectomy (Left, 06/14/2013); Knee arthroscopy (Right); Tumor removal (Left); Back surgery; Breast cyst excision (Left); Colostomy (1990s); Colostomy takedown (1990s); Colonoscopy w/ biopsies and polypectomy; and Colon surgery.   Her family history includes Cancer in her daughter, sister, and sister; Early death in her brother; Hypertension in her father and mother; Kidney disease in her father and mother.She reports that she quit smoking about 41 years ago. Her  smoking use included cigarettes. She started smoking about 65 years ago. She has a 2.9 pack-year smoking history. She quit smokeless tobacco use about 78 years ago.  Her smokeless tobacco use included snuff. She reports that she does not currently use alcohol. She reports that she does not use drugs.    ROS Review of Systems  Constitutional: Negative.   HENT:  Negative for congestion.   Eyes:  Negative for visual disturbance.   Respiratory:  Negative for shortness of breath.   Cardiovascular:  Negative for chest pain.  Gastrointestinal:  Negative for abdominal pain, constipation, diarrhea, nausea and vomiting.  Genitourinary:  Negative for difficulty urinating.  Musculoskeletal:  Negative for arthralgias and myalgias.  Neurological:  Negative for headaches.  Psychiatric/Behavioral:  Negative for sleep disturbance.     Objective:  BP (!) 147/82   Pulse 81   Temp (!) 97.4 F (36.3 C)   Ht 5' 3 (1.6 m)   Wt 129 lb 2 oz (58.6 kg)   SpO2 97%   BMI 22.87 kg/m   BP Readings from Last 3 Encounters:  05/04/24 (!) 147/82  04/21/24 (!) 157/87  04/19/24 132/71    Wt Readings from Last 3 Encounters:  05/04/24 129 lb 2 oz (58.6 kg)  04/21/24 130 lb (59 kg)  04/19/24 132 lb (59.9 kg)     Physical Exam Physical Exam GENERAL: Alert, cooperative, well developed, no acute distress HEENT: Normocephalic, normal oropharynx, moist mucous membranes CHEST: Clear to auscultation bilaterally, No wheezes, rhonchi, or crackles CARDIOVASCULAR: Normal heart rate and rhythm, S1 and S2 normal without murmurs ABDOMEN: Soft, non-tender, non-distended, without organomegaly, Normal bowel sounds EXTREMITIES: No cyanosis or edema NEUROLOGICAL: Cranial nerves grossly intact, Moves all extremities without gross motor or sensory deficit   Assessment & Plan:  Lumbar radiculopathy -     BMP8+EGFR  Lumbar disc herniation -     BMP8+EGFR  Essential hypertension, benign -     BMP8+EGFR  Other orders -     HYDROcodone -Acetaminophen ; Take 1 tablet by mouth in the morning, at noon, in the evening, and at bedtime.  Dispense: 120 tablet; Refill: 0    Assessment and Plan Assessment & Plan Lumbar radiculopathy due to herniated disc and spinal arthritis   Chronic lumbar radiculopathy presents with severe burning and tingling pain in her legs and feet. MRI confirms arthritis and herniated discs pinching nerves. Current treatment  includes gabapentin  and hydrocodone . Hydrocodone  7.5 mg caused drowsiness, so 5 mg is used, providing some relief but with recurring pain. A recent CT scan of the abdomen was normal. MRI of the spine showed arthritis and herniated discs. She receives injections from [facility] for pain management. A laser procedure to target the nerve causing pain was discussed, with risks including bowel control issues due to the first sacral nerve involvement. Increase hydrocodone  to 5 mg four times a day as needed for pain. Continue gabapentin  as prescribed and follow up with [facility] for ongoing pain management. A blood test is ordered to check kidney function before extending meloxicam  use.  Osteoarthritis of hip   Chronic osteoarthritis of the hip contributes to her pain. Meloxicam  provides some relief, but potential gastrointestinal and renal side effects were discussed. Continue meloxicam  once daily, not exceeding this dosage. A blood test is ordered to check kidney function before extending meloxicam  use.  Essential hypertension   Hypertension management is ongoing with a recent adjustment of metoprolol dosage. Continue the current antihypertensive regimen.  Follow-up: Return in about 1 month (around 06/01/2024).  Butler Der, M.D. "

## 2024-05-04 NOTE — Telephone Encounter (Unsigned)
 Copied from CRM #[id number]. Topic: Clinical - Prescription Issue >> May 04, 2024  3:57 PM Ivette P wrote: Reason for CRM: Pt son Camellia calling in saying pharmacy is refusing to refill because she received a refil too high of dosage on 04/24/2024 - please follow up with Camellia regarding medication.    HYDROcodone -acetaminophen  (NORCO/VICODIN) 5-325 MG tablet  [id number]

## 2024-05-05 LAB — BMP8+EGFR
BUN/Creatinine Ratio: 21 (ref 12–28)
BUN: 25 mg/dL (ref 8–27)
CO2: 21 mmol/L (ref 20–29)
Calcium: 9 mg/dL (ref 8.7–10.3)
Chloride: 101 mmol/L (ref 96–106)
Creatinine, Ser: 1.21 mg/dL — ABNORMAL HIGH (ref 0.57–1.00)
Glucose: 86 mg/dL (ref 70–99)
Potassium: 4 mmol/L (ref 3.5–5.2)
Sodium: 141 mmol/L (ref 134–144)
eGFR: 44 mL/min/{1.73_m2} — ABNORMAL LOW

## 2024-05-05 NOTE — Telephone Encounter (Signed)
 I contacted the pharmacy. Pharmacist would like provider to review PMP. Pain medication from multiple pharmacies. History of patient requesting early refills.Pharmacist said she would approve an early refill if the provider approves. She did say this is the last early fill they will do.

## 2024-05-11 ENCOUNTER — Ambulatory Visit: Admitting: Family Medicine

## 2024-06-02 ENCOUNTER — Ambulatory Visit: Admitting: Family Medicine

## 2024-06-14 ENCOUNTER — Ambulatory Visit: Admitting: Family Medicine
